# Patient Record
Sex: Female | Born: 1945 | Race: White | Hispanic: No | Marital: Married | Smoking: Former smoker
Health system: Southern US, Community
[De-identification: ages and names within clinical notes are randomized; demographics above are authoritative.]

## PROBLEM LIST (undated history)

## (undated) DIAGNOSIS — T7840XA Allergy, unspecified, initial encounter: Secondary | ICD-10-CM

## (undated) DIAGNOSIS — R42 Dizziness and giddiness: Secondary | ICD-10-CM

## (undated) DIAGNOSIS — I82402 Acute embolism and thrombosis of unspecified deep veins of left lower extremity: Secondary | ICD-10-CM

## (undated) DIAGNOSIS — Z972 Presence of dental prosthetic device (complete) (partial): Secondary | ICD-10-CM

## (undated) DIAGNOSIS — E785 Hyperlipidemia, unspecified: Secondary | ICD-10-CM

## (undated) DIAGNOSIS — N2889 Other specified disorders of kidney and ureter: Secondary | ICD-10-CM

## (undated) HISTORY — DX: Hyperlipidemia, unspecified: E78.5

## (undated) HISTORY — PX: SPINE SURGERY: SHX786

## (undated) HISTORY — DX: Allergy, unspecified, initial encounter: T78.40XA

## (undated) HISTORY — PX: CHOLECYSTECTOMY: SHX55

## (undated) HISTORY — PX: ADRENALECTOMY: SHX876

## (undated) HISTORY — DX: Other specified disorders of kidney and ureter: N28.89

## (undated) HISTORY — PX: LAPAROSCOPIC NEPHRECTOMY: SUR781

---

## 1994-07-08 HISTORY — PX: GANGLION CYST EXCISION: SHX1691

## 1997-11-21 ENCOUNTER — Ambulatory Visit (HOSPITAL_COMMUNITY): Admission: RE | Admit: 1997-11-21 | Discharge: 1997-11-21 | Payer: Self-pay | Admitting: Obstetrics & Gynecology

## 1999-07-09 DIAGNOSIS — C44621 Squamous cell carcinoma of skin of unspecified upper limb, including shoulder: Secondary | ICD-10-CM | POA: Insufficient documentation

## 2000-11-24 LAB — HM COLONOSCOPY

## 2001-12-02 ENCOUNTER — Other Ambulatory Visit: Admission: RE | Admit: 2001-12-02 | Discharge: 2001-12-02 | Payer: Self-pay | Admitting: Obstetrics & Gynecology

## 2002-12-08 ENCOUNTER — Other Ambulatory Visit: Admission: RE | Admit: 2002-12-08 | Discharge: 2002-12-08 | Payer: Self-pay | Admitting: Obstetrics & Gynecology

## 2004-01-25 ENCOUNTER — Other Ambulatory Visit: Admission: RE | Admit: 2004-01-25 | Discharge: 2004-01-25 | Payer: Self-pay | Admitting: Obstetrics & Gynecology

## 2004-03-19 ENCOUNTER — Encounter: Admission: RE | Admit: 2004-03-19 | Discharge: 2004-03-19 | Payer: Self-pay | Admitting: Obstetrics & Gynecology

## 2005-06-07 ENCOUNTER — Encounter: Admission: RE | Admit: 2005-06-07 | Discharge: 2005-06-07 | Payer: Self-pay | Admitting: Obstetrics and Gynecology

## 2006-06-11 ENCOUNTER — Encounter: Admission: RE | Admit: 2006-06-11 | Discharge: 2006-06-11 | Payer: Self-pay | Admitting: Obstetrics and Gynecology

## 2007-07-15 ENCOUNTER — Encounter: Admission: RE | Admit: 2007-07-15 | Discharge: 2007-07-15 | Payer: Self-pay | Admitting: Obstetrics and Gynecology

## 2008-07-15 ENCOUNTER — Encounter: Admission: RE | Admit: 2008-07-15 | Discharge: 2008-07-15 | Payer: Self-pay | Admitting: Obstetrics and Gynecology

## 2009-08-08 ENCOUNTER — Encounter: Admission: RE | Admit: 2009-08-08 | Discharge: 2009-08-08 | Payer: Self-pay | Admitting: Obstetrics and Gynecology

## 2010-06-26 ENCOUNTER — Ambulatory Visit: Payer: Self-pay | Admitting: Family Medicine

## 2010-07-29 ENCOUNTER — Encounter: Payer: Self-pay | Admitting: Obstetrics and Gynecology

## 2010-08-06 ENCOUNTER — Other Ambulatory Visit: Payer: Self-pay | Admitting: Family Medicine

## 2010-08-06 DIAGNOSIS — Z1239 Encounter for other screening for malignant neoplasm of breast: Secondary | ICD-10-CM

## 2010-08-27 ENCOUNTER — Ambulatory Visit
Admission: RE | Admit: 2010-08-27 | Discharge: 2010-08-27 | Disposition: A | Discharge status: Home / Self Care | Payer: BC Managed Care – PPO | Source: Ambulatory Visit | Attending: Family Medicine | Admitting: Family Medicine

## 2010-08-27 DIAGNOSIS — Z1239 Encounter for other screening for malignant neoplasm of breast: Secondary | ICD-10-CM

## 2011-08-09 ENCOUNTER — Other Ambulatory Visit: Payer: Self-pay | Admitting: Family Medicine

## 2011-08-09 DIAGNOSIS — Z1231 Encounter for screening mammogram for malignant neoplasm of breast: Secondary | ICD-10-CM

## 2011-08-09 HISTORY — PX: MELANOMA EXCISION: SHX5266

## 2011-08-29 ENCOUNTER — Ambulatory Visit: Payer: BC Managed Care – PPO

## 2011-09-03 ENCOUNTER — Ambulatory Visit
Admission: RE | Admit: 2011-09-03 | Discharge: 2011-09-03 | Disposition: A | Discharge status: Home / Self Care | Payer: BC Managed Care – PPO | Source: Ambulatory Visit | Attending: Family Medicine | Admitting: Family Medicine

## 2011-09-03 DIAGNOSIS — Z1231 Encounter for screening mammogram for malignant neoplasm of breast: Secondary | ICD-10-CM

## 2011-11-27 LAB — HM PAP SMEAR: HM Pap smear: NEGATIVE

## 2012-01-21 LAB — HM COLONOSCOPY

## 2012-08-07 ENCOUNTER — Other Ambulatory Visit (HOSPITAL_COMMUNITY): Payer: Self-pay | Admitting: Family Medicine

## 2012-08-07 DIAGNOSIS — Z1231 Encounter for screening mammogram for malignant neoplasm of breast: Secondary | ICD-10-CM

## 2012-09-04 ENCOUNTER — Ambulatory Visit (HOSPITAL_COMMUNITY)
Admission: RE | Admit: 2012-09-04 | Discharge: 2012-09-04 | Disposition: A | Discharge status: Home / Self Care | Payer: BC Managed Care – PPO | Source: Ambulatory Visit | Attending: Family Medicine | Admitting: Family Medicine

## 2012-09-04 DIAGNOSIS — Z1231 Encounter for screening mammogram for malignant neoplasm of breast: Secondary | ICD-10-CM | POA: Insufficient documentation

## 2013-07-27 ENCOUNTER — Other Ambulatory Visit (HOSPITAL_COMMUNITY): Payer: Self-pay | Admitting: Family Medicine

## 2013-07-27 DIAGNOSIS — Z1231 Encounter for screening mammogram for malignant neoplasm of breast: Secondary | ICD-10-CM

## 2013-09-07 ENCOUNTER — Ambulatory Visit (HOSPITAL_COMMUNITY)
Admission: RE | Admit: 2013-09-07 | Discharge: 2013-09-07 | Disposition: A | Discharge status: Home / Self Care | Payer: Medicare Other | Source: Ambulatory Visit | Attending: Family Medicine | Admitting: Family Medicine

## 2013-09-07 ENCOUNTER — Other Ambulatory Visit (HOSPITAL_COMMUNITY): Payer: Self-pay | Admitting: Family Medicine

## 2013-09-07 DIAGNOSIS — Z1231 Encounter for screening mammogram for malignant neoplasm of breast: Secondary | ICD-10-CM | POA: Insufficient documentation

## 2014-01-10 LAB — CBC AND DIFFERENTIAL
HCT: 38 % (ref 36–46)
Hemoglobin: 13.3 g/dL (ref 12.0–16.0)
Platelets: 339 10*3/uL (ref 150–399)
WBC: 5.9 10^3/mL

## 2014-01-10 LAB — BASIC METABOLIC PANEL
BUN: 11 mg/dL (ref 4–21)
Creatinine: 0.7 mg/dL (ref 0.5–1.1)
Glucose: 94 mg/dL
POTASSIUM: 4.5 mmol/L (ref 3.4–5.3)
Sodium: 143 mmol/L (ref 137–147)

## 2014-01-10 LAB — LIPID PANEL
Cholesterol: 219 mg/dL — AB (ref 0–200)
HDL: 57 mg/dL (ref 35–70)
LDL Cholesterol: 139 mg/dL
TRIGLYCERIDES: 115 mg/dL (ref 40–160)

## 2014-01-10 LAB — HEPATIC FUNCTION PANEL
ALT: 19 U/L (ref 7–35)
AST: 23 U/L (ref 13–35)

## 2014-01-10 LAB — TSH: TSH: 1.99 u[IU]/mL (ref 0.41–5.90)

## 2014-08-16 ENCOUNTER — Other Ambulatory Visit (HOSPITAL_COMMUNITY): Payer: Self-pay | Admitting: Family Medicine

## 2014-08-16 DIAGNOSIS — Z1231 Encounter for screening mammogram for malignant neoplasm of breast: Secondary | ICD-10-CM

## 2014-09-14 ENCOUNTER — Ambulatory Visit (HOSPITAL_COMMUNITY)
Admission: RE | Admit: 2014-09-14 | Discharge: 2014-09-14 | Disposition: A | Discharge status: Home / Self Care | Payer: Medicare Other | Source: Ambulatory Visit | Attending: Family Medicine | Admitting: Family Medicine

## 2014-09-14 DIAGNOSIS — Z1231 Encounter for screening mammogram for malignant neoplasm of breast: Secondary | ICD-10-CM | POA: Diagnosis not present

## 2014-09-14 LAB — HM MAMMOGRAPHY

## 2014-11-11 DIAGNOSIS — Z85820 Personal history of malignant melanoma of skin: Secondary | ICD-10-CM | POA: Insufficient documentation

## 2014-11-11 DIAGNOSIS — J309 Allergic rhinitis, unspecified: Secondary | ICD-10-CM | POA: Insufficient documentation

## 2014-11-11 DIAGNOSIS — E559 Vitamin D deficiency, unspecified: Secondary | ICD-10-CM | POA: Insufficient documentation

## 2014-11-11 DIAGNOSIS — E7800 Pure hypercholesterolemia, unspecified: Secondary | ICD-10-CM | POA: Insufficient documentation

## 2015-02-02 ENCOUNTER — Ambulatory Visit (INDEPENDENT_AMBULATORY_CARE_PROVIDER_SITE_OTHER): Payer: Medicare Other | Admitting: Family Medicine

## 2015-02-02 ENCOUNTER — Encounter: Payer: Self-pay | Admitting: Family Medicine

## 2015-02-02 VITALS — BP 120/60 | HR 70 | Temp 97.8°F | Resp 16 | Ht 65.0 in | Wt 144.0 lb

## 2015-02-02 DIAGNOSIS — Z13820 Encounter for screening for osteoporosis: Secondary | ICD-10-CM | POA: Diagnosis not present

## 2015-02-02 DIAGNOSIS — Z0000 Encounter for general adult medical examination without abnormal findings: Secondary | ICD-10-CM | POA: Diagnosis not present

## 2015-02-02 NOTE — Progress Notes (Signed)
Patient: Rita Webb, Female    DOB: Mar 13, 1946, 69 y.o.   MRN: [id number] Visit Date: 02/02/2015  Today's Provider: Margarita Rana, MD   Chief Complaint  Patient presents with  . Annual Exam    Medicare Wellness Exam   Subjective:    Annual wellness visit Rita Webb is a 69 y.o. female who presents today for her Subsequent Annual Wellness Visit. She feels well. She reports exercising 4-5 times a week just started this month. She reports she is sleeping well. Last PCP was 12/29/13.Last Mammogram was 09/14/14-BI-RAD-Category 1: Negative.Had an EKG on 12/28/12 Normal. Oc-Lyte was done 11/27/11 was Negative. Last Pap Smear was 06/20/10 normal.    Review of Systems  Constitutional: Negative.   HENT: Positive for postnasal drip, rhinorrhea and sinus pressure.        Always have this symptoms.  Eyes: Negative.   Respiratory: Positive for cough.   Cardiovascular: Negative.   Gastrointestinal: Negative.   Endocrine: Negative.   Genitourinary: Negative.   Musculoskeletal: Negative.   Skin: Negative.   Allergic/Immunologic: Negative.   Neurological: Negative.   Hematological: Bruises/bleeds easily (Noticed the bruise on the left hand this week. Doesn't remember if she hurt herself).  Psychiatric/Behavioral: Negative.  The patient is not nervous/anxious.     History   Social History  . Marital Status: Married    Spouse Name: N/A  . Number of Children: N/A  . Years of Education: N/A   Occupational History  . Not on file.   Social History Main Topics  . Smoking status: Former Smoker    Quit date: 11/10/1968  . Smokeless tobacco: Never Used  . Alcohol Use: No  . Drug Use: No  . Sexual Activity: Not on file   Other Topics Concern  . Not on file   Social History Narrative    Patient Active Problem List   Diagnosis Date Noted  . Allergic rhinitis 11/11/2014  . Hypercholesteremia 11/11/2014  . Abdominal malignancy 11/11/2014  . Avitaminosis D  11/11/2014    Past Surgical History  Procedure Laterality Date  . Spine surgery    . Melanoma excision  08/2011  . Ganglion cyst excision Right 1996    wrist    Her family history includes Breast cancer in her mother; Diabetes in her maternal aunt and maternal uncle; Prostate cancer in her father.    Previous Medications   AZELASTINE (ASTELIN) 0.1 % NASAL SPRAY       B COMPLEX-C PO    Take by mouth.   BECLOMETHASONE (QVAR) 40 MCG/ACT INHALER    Inhale into the lungs.   CALCIUM CARBONATE-VITAMIN D 600-200 MG-UNIT CAPS    Take by mouth.   CETIRIZINE HCL 10 MG CAPS    Take by mouth.   CHOLECALCIFEROL 1000 UNITS CAPSULE    Take by mouth.   IBUPROFEN 200 MG CAPS    Take by mouth.   MONTELUKAST (SINGULAIR) 10 MG TABLET    Take by mouth.   MULTIPLE VITAMIN PO    Take by mouth.   OMEGA-3 FATTY ACIDS (FISH OIL) 1200 MG CAPS    Take by mouth.   PSYLLIUM (REGULOID) 0.52 G CAPSULE    Take by mouth.    Patient Care Team: Margarita Rana, MD as PCP - General (Family Medicine)     Objective:   Vitals: BP 120/60 mmHg  Pulse 70  Temp(Src) 97.8 F (36.6 C) (Oral)  Resp 16  Ht '5\' 5"'$  (1.651 m)  Wt 144 lb (65.318 kg)  BMI 23.96 kg/m2  Physical Exam  Constitutional: She is oriented to person, place, and time. She appears well-developed and well-nourished.  HENT:  Head: Normocephalic and atraumatic.  Right Ear: Tympanic membrane, external ear and ear canal normal.  Left Ear: Tympanic membrane, external ear and ear canal normal.  Nose: Nose normal.  Mouth/Throat: Uvula is midline, oropharynx is clear and moist and mucous membranes are normal.  Eyes: Conjunctivae, EOM and lids are normal. Pupils are equal, round, and reactive to light.  Neck: Trachea normal and normal range of motion. Neck supple. Carotid bruit is not present. No thyroid mass and no thyromegaly present.  Cardiovascular: Normal rate, regular rhythm and normal heart sounds.   Pulmonary/Chest: Effort normal and breath sounds  normal.  Abdominal: Soft. Normal appearance and bowel sounds are normal. There is no hepatosplenomegaly. There is no tenderness.  Genitourinary: No breast swelling, tenderness or discharge.  Musculoskeletal: Normal range of motion.  Lymphadenopathy:    She has no cervical adenopathy.    She has no axillary adenopathy.  Neurological: She is alert and oriented to person, place, and time. She has normal strength. No cranial nerve deficit.  Skin: Skin is warm, dry and intact.  Psychiatric: She has a normal mood and affect. Her speech is normal and behavior is normal. Judgment and thought content normal. Cognition and memory are normal.       Activities of Daily Living In your present state of health, do you have any difficulty performing the following activities: 02/02/2015  Hearing? N  Vision? N  Difficulty concentrating or making decisions? N  Walking or climbing stairs? N  Dressing or bathing? N  Doing errands, shopping? N    Fall Risk Assessment Fall Risk  02/02/2015  Falls in the past year? No     Depression Screen PHQ 2/9 Scores 02/02/2015  PHQ - 2 Score 0    Cognitive Testing - 6-CIT  Correct? Score   What year is it? yes 0 0 or 4  What month is it? yes 0 0 or 3  Memorize:    [name],  44,  [address],  [place],      What time is it? (within 1 hour) yes 0 0 or 3  Count backwards from 20 yes 0 0, 2, or 4  Name the months of the year yes 0 0, 2, or 4  Repeat name & address above yes 0 0, 2, 4, 6, 8, or 10       TOTAL SCORE  0/28   Interpretation:  Normal  Normal (0-7) Abnormal (8-28)       Assessment & Plan:     Annual Wellness Visit  Reviewed patient's Family Medical History Reviewed and updated list of patient's medical providers Assessment of cognitive impairment was done Assessed patient's functional ability Established a written schedule for health screening [place] Completed and Reviewed  Exercise Activities and Dietary  recommendations Goals    None      Immunization History  Administered Date(s) Administered  . Pneumococcal Conjugate-13 12/29/2013  . Pneumococcal Polysaccharide-23 12/28/2012  . Tdap 11/27/2011  . Zoster 01/23/2011    Health Maintenance  Topic Date Due  . COLONOSCOPY  11/25/2010  . INFLUENZA VACCINE  02/06/2015  . MAMMOGRAM  09/13/2016  . TETANUS/TDAP  11/26/2021  . DEXA SCAN  Completed  . ZOSTAVAX  Completed  . PNA vac Low Risk Adult  Completed      Discussed  health benefits of physical activity, and encouraged her to engage in regular exercise appropriate for her age and condition.    2. Screening for osteoporosis Scan ordered.     - DG Bone Density; Future  Patient was seen and examined by Jerrell Belfast, MD, and note scribed by Lyndel Pleasure, [place].  I have reviewed the document for accuracy and completeness and I agree with above. Jerrell Belfast, MD   Margarita Rana, MD

## 2015-02-08 ENCOUNTER — Ambulatory Visit
Admission: RE | Admit: 2015-02-08 | Discharge: 2015-02-08 | Disposition: A | Discharge status: Home / Self Care | Payer: Medicare Other | Source: Ambulatory Visit | Attending: Family Medicine | Admitting: Family Medicine

## 2015-02-08 DIAGNOSIS — Z13820 Encounter for screening for osteoporosis: Secondary | ICD-10-CM | POA: Diagnosis present

## 2015-02-08 DIAGNOSIS — M810 Age-related osteoporosis without current pathological fracture: Secondary | ICD-10-CM | POA: Insufficient documentation

## 2015-02-09 ENCOUNTER — Telehealth: Payer: Self-pay

## 2015-02-09 ENCOUNTER — Encounter: Payer: Self-pay | Admitting: Family Medicine

## 2015-02-09 NOTE — Telephone Encounter (Signed)
Pt advised apt made for 02/27/2015   Thanks,   -Mickel Baas

## 2015-02-09 NOTE — Telephone Encounter (Signed)
-----   Message from Margarita Rana, MD sent at 02/09/2015  7:08 AM EDT ----- Bone density does show osteoporosis. Recommend ov to address treatment options. Thanks.

## 2015-02-27 ENCOUNTER — Encounter: Payer: Self-pay | Admitting: Family Medicine

## 2015-02-27 ENCOUNTER — Ambulatory Visit (INDEPENDENT_AMBULATORY_CARE_PROVIDER_SITE_OTHER): Payer: Medicare Other | Admitting: Family Medicine

## 2015-02-27 VITALS — BP 112/60 | HR 76 | Temp 98.2°F | Resp 16 | Ht 65.0 in | Wt 143.0 lb

## 2015-02-27 DIAGNOSIS — M810 Age-related osteoporosis without current pathological fracture: Secondary | ICD-10-CM | POA: Diagnosis not present

## 2015-02-27 NOTE — Progress Notes (Signed)
Patient ID: Rita Webb, female   DOB: [birth year], 69 y.o.   MRN: [id number]        Patient: Rita Webb Female    DOB: [birth year]   69 y.o.   MRN: [id number] Visit Date: 02/27/2015  Today's Provider: Margarita Rana, MD   Chief Complaint  Patient presents with  . Osteoporosis   Subjective:    HPI  Osteoporosis: Patient here to discuss treatment options for osteoporosis. She was diagnosed with osteoporosis by bone density scan in 02/08/2015. Patient denies history of fracture.The cause of osteoporosis is felt to be due to age.   She is currently being treated with calcium and vitamin D supplementation.  She is not currently being treated with bisphosphonates  Osteoporosis Risk Factors  Nonmodifiable Personal Hx of fracture as an adult: no Hx of fracture in first-degree relative: no Caucasian race: yes Advanced age: 81 Female sex: yes Dementia: no  Current calcium and Vit D intake: Dietary sources: healthy  Supplements: Calcium-Vit D, and multi vitamin daily.  Ten-year fracture probability by FRAX is less than 1 percent for hip, but does have osteoporosis in spine. No history of vertebral fractures.      Allergies  Allergen Reactions  . Cefuroxime Diarrhea   Previous Medications   AZELASTINE (ASTELIN) 0.1 % NASAL SPRAY    Place 1 spray into both nostrils.    B COMPLEX-C PO    Take 1 tablet by mouth daily.    BECLOMETHASONE (QVAR) 40 MCG/ACT INHALER    Inhale 2 puffs into the lungs 2 (two) times daily.    CALCIUM CARBONATE-VITAMIN D 600-200 MG-UNIT CAPS    Take 2 capsules by mouth daily.    CETIRIZINE HCL 10 MG CAPS    Take 1 capsule by mouth daily.    CHOLECALCIFEROL 1000 UNITS CAPSULE    Take 1,000 Units by mouth daily.    IBUPROFEN 200 MG CAPS    Take 1 capsule by mouth as needed.    MONTELUKAST (SINGULAIR) 10 MG TABLET    Take 10 mg by mouth at bedtime.    MULTIPLE VITAMIN PO    Take 1 tablet by mouth daily.    OMEGA-3 FATTY ACIDS (FISH OIL) 1200 MG CAPS     Take 1 capsule by mouth daily.    PSYLLIUM (REGULOID) 0.52 G CAPSULE    Take 1 capsule by mouth 6 (six) times daily.     Review of Systems  Constitutional: Negative.   Respiratory: Negative.   Cardiovascular: Negative.   Musculoskeletal: Positive for arthralgias (leg pain). Negative for back pain and joint swelling.  Neurological: Negative for numbness.  Psychiatric/Behavioral: Negative.     Social History  Substance Use Topics  . Smoking status: Former Smoker    Quit date: 11/10/1968  . Smokeless tobacco: Never Used  . Alcohol Use: No   Objective:   BP 112/60 mmHg  Pulse 76  Temp(Src) 98.2 F (36.8 C) (Oral)  Resp 16  Ht '5\' 5"'$  (1.651 m)  Wt 143 lb (64.864 kg)  BMI 23.80 kg/m2  SpO2 97%  Physical Exam  Constitutional: She is oriented to person, place, and time. She appears well-developed and well-nourished.  Neurological: She is oriented to person, place, and time.  Psychiatric: She has a normal mood and affect.        Assessment & Plan:     1. 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 Will hold off on medication for now.  Treat with lifestyle. Will call if any problems or  changes. Particularly back pain. Repeat bone density in 2 years.        Margarita Rana, MD  [facility]

## 2015-03-08 ENCOUNTER — Telehealth: Payer: Self-pay

## 2015-03-08 NOTE — Telephone Encounter (Signed)
Patients husband Patrick Jupiter called stating patient was in the shower shaving her legs and she cut the side of her left leg. At first blood was spurting out. Patrick Jupiter covered the cut with gauze and secured it with tape. Bleeding has slowed up since applying the dressing. Patient is alert and oriented. Patrick Jupiter wanted to know if patient should come into the office or go to the ER. Patient is not on any blood thinners or Aspirin. Patient does have varicose veins and may have nicked one while shaving. I consulted with Dr. Venia Minks who states patient should continue to keep the cut covered with a pressure dressing to stop bleeding and if patient wants to she can come in tomorrow for re check. Patient advised and verbally voiced understanding. Patient wants to come in tomorrow to be seen. Appointment scheduled.

## 2015-03-09 ENCOUNTER — Encounter: Payer: Self-pay | Admitting: Family Medicine

## 2015-03-09 ENCOUNTER — Ambulatory Visit (INDEPENDENT_AMBULATORY_CARE_PROVIDER_SITE_OTHER): Payer: Medicare Other | Admitting: Family Medicine

## 2015-03-09 VITALS — BP 122/62 | HR 68 | Temp 98.5°F | Resp 16 | Ht 65.0 in | Wt 144.0 lb

## 2015-03-09 DIAGNOSIS — I8393 Asymptomatic varicose veins of bilateral lower extremities: Secondary | ICD-10-CM | POA: Diagnosis not present

## 2015-03-09 DIAGNOSIS — I8390 Asymptomatic varicose veins of unspecified lower extremity: Secondary | ICD-10-CM

## 2015-03-09 DIAGNOSIS — R58 Hemorrhage, not elsewhere classified: Secondary | ICD-10-CM

## 2015-03-09 NOTE — Progress Notes (Signed)
Subjective:    Patient ID: Rita Webb, female    DOB: 27-Feb-1946, 69 y.o.   MRN: [id number]  HPI Comments: Pt cut her leg while shaving. States, "blood was spewing everywhere!" Pt wrapped the laceration about 10 minutes after she cut herself. Pt states she felt lightheaded yesterday, but is unsure if it was secondary to blood loss vs anxiety of cutting self.  Laceration  The incident occurred 12 to 24 hours ago. The laceration is located on the left leg. The laceration mechanism was a razor. The pain is at a severity of 0/10. The patient is experiencing no pain. She reports no foreign bodies present. Her tetanus status is UTD (11/27/2011).      Review of Systems  Constitutional: Negative for fever, chills, diaphoresis, activity change, appetite change, fatigue and unexpected weight change.  Respiratory: Positive for cough. Negative for chest tightness, shortness of breath and wheezing.   Cardiovascular: Negative for chest pain, palpitations and leg swelling.  Skin: Positive for wound. Negative for color change, pallor and rash.  Allergic/Immunologic: Positive for environmental allergies.   BP 122/62 mmHg  Pulse 68  Temp(Src) 98.5 F (36.9 C) (Oral)  Resp 16  Ht '5\' 5"'$  (1.651 m)  Wt 144 lb (65.318 kg)  BMI 23.96 kg/m2   Patient Active Problem List   Diagnosis Date Noted  . Osteoporosis 02/27/2015  . Allergic rhinitis 11/11/2014  . Hypercholesteremia 11/11/2014  . Abdominal malignancy 11/11/2014  . Avitaminosis D 11/11/2014   Past Medical History  Diagnosis Date  . Allergy   . Hyperlipidemia    Current Outpatient Prescriptions on File Prior to Visit  Medication Sig  . azelastine (ASTELIN) 0.1 % nasal spray Place 1 spray into both nostrils.   . B COMPLEX-C PO Take 1 tablet by mouth daily.   . beclomethasone (QVAR) 40 MCG/ACT inhaler Inhale 2 puffs into the lungs 2 (two) times daily.   . Calcium Carbonate-Vitamin D 600-200 MG-UNIT CAPS Take 2 capsules by mouth daily.    . Cetirizine HCl 10 MG CAPS Take 1 capsule by mouth daily.   . Cholecalciferol 1000 UNITS capsule Take 1,000 Units by mouth daily.   . Ibuprofen 200 MG CAPS Take 1 capsule by mouth as needed.   . montelukast (SINGULAIR) 10 MG tablet Take 10 mg by mouth at bedtime.   . MULTIPLE VITAMIN PO Take 1 tablet by mouth daily.   . Omega-3 Fatty Acids (FISH OIL) 1200 MG CAPS Take 1 capsule by mouth daily.   . psyllium (REGULOID) 0.52 G capsule Take 1 capsule by mouth 6 (six) times daily.    No current facility-administered medications on file prior to visit.   Allergies  Allergen Reactions  . Cefuroxime Diarrhea   Past Surgical History  Procedure Laterality Date  . Spine surgery    . Melanoma excision  08/2011  . Ganglion cyst excision Right 1996    wrist   Social History   Social History  . Marital Status: Married    Spouse Name: N/A  . Number of Children: N/A  . Years of Education: N/A   Occupational History  . Not on file.   Social History Main Topics  . Smoking status: Former Smoker    Quit date: 11/10/1968  . Smokeless tobacco: Never Used  . Alcohol Use: No  . Drug Use: No  . Sexual Activity: Not on file   Other Topics Concern  . Not on file   Social History Narrative   Family History  Problem Relation Age of Onset  . Breast cancer Mother   . Prostate cancer Father   . Diabetes Maternal Aunt   . Diabetes Maternal Uncle       Objective:   Physical Exam  Constitutional: She appears well-developed and well-nourished.  Musculoskeletal: She exhibits no edema or tenderness.  Skin: Skin is warm and dry.  Does have a varicose vein lesion noted.  Small scab noted.  No active bleeding.     BP 122/62 mmHg  Pulse 68  Temp(Src) 98.5 F (36.9 C) (Oral)  Resp 16  Ht '5\' 5"'$  (1.651 m)  Wt 144 lb (65.318 kg)  BMI 23.96 kg/m2      Assessment & Plan:  1. Varicose vein of leg Stable.   2. Bleeding Related to shaving.  Do not shave in shower.  Monitor and have  dermatology assess if does not heal.   Margarita Rana, MD

## 2015-05-11 ENCOUNTER — Ambulatory Visit (INDEPENDENT_AMBULATORY_CARE_PROVIDER_SITE_OTHER): Payer: Medicare Other

## 2015-05-11 DIAGNOSIS — Z23 Encounter for immunization: Secondary | ICD-10-CM | POA: Diagnosis not present

## 2015-08-11 ENCOUNTER — Other Ambulatory Visit: Payer: Self-pay

## 2015-08-11 DIAGNOSIS — Z1231 Encounter for screening mammogram for malignant neoplasm of breast: Secondary | ICD-10-CM

## 2015-09-19 ENCOUNTER — Ambulatory Visit
Admission: RE | Admit: 2015-09-19 | Discharge: 2015-09-19 | Disposition: A | Discharge status: Home / Self Care | Payer: Medicare Other | Source: Ambulatory Visit

## 2015-09-19 DIAGNOSIS — Z1231 Encounter for screening mammogram for malignant neoplasm of breast: Secondary | ICD-10-CM

## 2015-09-21 DIAGNOSIS — J4530 Mild persistent asthma, uncomplicated: Secondary | ICD-10-CM | POA: Diagnosis not present

## 2015-09-21 DIAGNOSIS — H1045 Other chronic allergic conjunctivitis: Secondary | ICD-10-CM | POA: Diagnosis not present

## 2015-09-21 DIAGNOSIS — J300 Vasomotor rhinitis: Secondary | ICD-10-CM | POA: Diagnosis not present

## 2015-10-31 DIAGNOSIS — L570 Actinic keratosis: Secondary | ICD-10-CM | POA: Diagnosis not present

## 2015-10-31 DIAGNOSIS — Z85828 Personal history of other malignant neoplasm of skin: Secondary | ICD-10-CM | POA: Diagnosis not present

## 2015-10-31 DIAGNOSIS — Z85820 Personal history of malignant melanoma of skin: Secondary | ICD-10-CM | POA: Diagnosis not present

## 2015-10-31 DIAGNOSIS — D225 Melanocytic nevi of trunk: Secondary | ICD-10-CM | POA: Diagnosis not present

## 2015-10-31 DIAGNOSIS — X32XXXA Exposure to sunlight, initial encounter: Secondary | ICD-10-CM | POA: Diagnosis not present

## 2015-10-31 DIAGNOSIS — L821 Other seborrheic keratosis: Secondary | ICD-10-CM | POA: Diagnosis not present

## 2016-01-10 DIAGNOSIS — H00011 Hordeolum externum right upper eyelid: Secondary | ICD-10-CM | POA: Diagnosis not present

## 2016-02-07 ENCOUNTER — Ambulatory Visit (INDEPENDENT_AMBULATORY_CARE_PROVIDER_SITE_OTHER): Payer: Medicare Other | Admitting: Physician Assistant

## 2016-02-07 ENCOUNTER — Encounter: Payer: Self-pay | Admitting: Physician Assistant

## 2016-02-07 VITALS — BP 140/70 | HR 72 | Temp 98.3°F | Resp 16 | Ht 65.5 in | Wt 151.0 lb

## 2016-02-07 DIAGNOSIS — E7800 Pure hypercholesterolemia, unspecified: Secondary | ICD-10-CM

## 2016-02-07 DIAGNOSIS — IMO0001 Reserved for inherently not codable concepts without codable children: Secondary | ICD-10-CM

## 2016-02-07 DIAGNOSIS — J320 Chronic maxillary sinusitis: Secondary | ICD-10-CM | POA: Diagnosis not present

## 2016-02-07 DIAGNOSIS — Z131 Encounter for screening for diabetes mellitus: Secondary | ICD-10-CM | POA: Diagnosis not present

## 2016-02-07 DIAGNOSIS — J302 Other seasonal allergic rhinitis: Secondary | ICD-10-CM

## 2016-02-07 DIAGNOSIS — Z0000 Encounter for general adult medical examination without abnormal findings: Secondary | ICD-10-CM | POA: Diagnosis not present

## 2016-02-07 DIAGNOSIS — R030 Elevated blood-pressure reading, without diagnosis of hypertension: Secondary | ICD-10-CM | POA: Diagnosis not present

## 2016-02-07 NOTE — Patient Instructions (Signed)

## 2016-02-07 NOTE — Progress Notes (Signed)
Patient: Rita Webb, Female    DOB: 26-Oct-1945, 70 y.o.   MRN: [id number] Visit Date: 02/07/2016  Today's Provider: Mar Daring, PA-C   Chief Complaint  Patient presents with  . Medicare Wellness   Subjective:    Annual wellness visit Rita Webb is a 70 y.o. female. She feels well. She reports exercising- walks a mile daily. She reports she is sleeping well.  Last PCP:02/02/15 EKG: 12/31/2012 BMD: 02/08/15 Osteoporosis-Repeat in 2 years. Mammogram: 09/19/15 BI-RADS Category 1:Negative Colonoscopy:01/21/2012 -----------------------------------------------------------   Review of Systems  Constitutional: Negative.   HENT: Positive for congestion, postnasal drip, rhinorrhea, sinus pressure, sneezing and tinnitus.   Eyes: Positive for itching.  Respiratory: Positive for cough.   Cardiovascular: Negative.   Gastrointestinal: Negative.   Endocrine: Negative.   Genitourinary: Negative.   Musculoskeletal: Negative.   Skin: Negative.   Allergic/Immunologic: Positive for environmental allergies.  Neurological: Negative.   Hematological: Bruises/bleeds easily.  Psychiatric/Behavioral: Negative.     Social History   Social History  . Marital status: Married    Spouse name: N/A  . Number of children: N/A  . Years of education: N/A   Occupational History  . Not on file.   Social History Main Topics  . Smoking status: Former Smoker    Quit date: 11/10/1968  . Smokeless tobacco: Never Used  . Alcohol use No  . Drug use: No  . Sexual activity: Not on file   Other Topics Concern  . Not on file   Social History Narrative  . No narrative on file    Past Medical History:  Diagnosis Date  . Allergy   . Hyperlipidemia      Patient Active Problem List   Diagnosis Date Noted  . Osteoporosis 02/27/2015  . Allergic rhinitis 11/11/2014  . Hypercholesteremia 11/11/2014  . Abdominal malignancy ([place]) 11/11/2014  . Avitaminosis D 11/11/2014     Past Surgical History:  Procedure Laterality Date  . GANGLION CYST EXCISION Right 1996   wrist  . MELANOMA EXCISION  08/2011  . SPINE SURGERY      Her family history includes Breast cancer in her mother; Diabetes in her maternal aunt and maternal uncle; Prostate cancer in her father.    Current Meds  Medication Sig  . azelastine (ASTELIN) 0.1 % nasal spray Place 1 spray into both nostrils.   . B COMPLEX-C PO Take 1 tablet by mouth daily.   . Calcium Carbonate-Vitamin D 600-200 MG-UNIT CAPS Take 2 capsules by mouth daily.   . Cetirizine HCl 10 MG CAPS Take 1 capsule by mouth daily.   . Cholecalciferol 1000 UNITS capsule Take 1,000 Units by mouth daily.   . Ibuprofen 200 MG CAPS Take 1 capsule by mouth as needed.   . montelukast (SINGULAIR) 10 MG tablet Take 10 mg by mouth at bedtime.   . MULTIPLE VITAMIN PO Take 1 tablet by mouth daily.   . Omega-3 Fatty Acids (FISH OIL) 1200 MG CAPS Take 1 capsule by mouth daily.   . psyllium (REGULOID) 0.52 G capsule Take 1 capsule by mouth 6 (six) times daily.   . vitamin E 400 UNIT capsule Take 400 Units by mouth daily.    Patient Care Team: Margarita Rana, MD as PCP - General (Family Medicine)    Objective:   Vitals: BP 140/70 (BP Location: Right Arm, Patient Position: Sitting, Cuff Size: Normal)   Pulse 72   Temp 98.3 F (36.8 C) (Oral)   Resp 16  Ht 5' 5.5" (1.664 m)   Wt 151 lb (68.5 kg)   BMI 24.75 kg/m   Physical Exam  Constitutional: She is oriented to person, place, and time. She appears well-developed and well-nourished. No distress.  HENT:  Head: Normocephalic and atraumatic.  Right Ear: Tympanic membrane, external ear and ear canal normal.  Left Ear: Tympanic membrane, external ear and ear canal normal.  Nose: Right sinus exhibits no maxillary sinus tenderness and no frontal sinus tenderness. Left sinus exhibits maxillary sinus tenderness. Left sinus exhibits no frontal sinus tenderness.  Mouth/Throat: Uvula is  midline, oropharynx is clear and moist and mucous membranes are normal. No oropharyngeal exudate, posterior oropharyngeal edema or posterior oropharyngeal erythema.  Thick, clear post nasal drainage noted  Eyes: Conjunctivae and EOM are normal. Pupils are equal, round, and reactive to light. Right eye exhibits no discharge. Left eye exhibits no discharge. No scleral icterus.  Neck: Normal range of motion. Neck supple. No JVD present. Carotid bruit is not present. No tracheal deviation present. No thyromegaly present.  Cardiovascular: Normal rate, regular rhythm, normal heart sounds and intact distal pulses.  Exam reveals no gallop and no friction rub.   No murmur heard. Pulmonary/Chest: Effort normal and breath sounds normal. No respiratory distress. She has no wheezes. She has no rales. She exhibits no tenderness.  Abdominal: Soft. Bowel sounds are normal. She exhibits no distension and no mass. There is no tenderness. There is no rebound and no guarding.  Musculoskeletal: Normal range of motion. She exhibits no edema or tenderness.  Lymphadenopathy:    She has no cervical adenopathy.  Neurological: She is alert and oriented to person, place, and time.  Skin: Skin is warm and dry. No rash noted. She is not diaphoretic.  Psychiatric: She has a normal mood and affect. Her behavior is normal. Judgment and thought content normal.  Vitals reviewed.   Activities of Daily Living In your present state of health, do you have any difficulty performing the following activities: 02/07/2016  Hearing? N  Vision? N  Difficulty concentrating or making decisions? N  Walking or climbing stairs? N  Dressing or bathing? N  Doing errands, shopping? N  Some recent data might be hidden    Fall Risk Assessment Fall Risk  02/07/2016 02/02/2015  Falls in the past year? No No     Depression Screen PHQ 2/9 Scores 02/07/2016 02/02/2015  PHQ - 2 Score 0 0    Cognitive Testing - 6-CIT  Correct? Score   What year  is it? yes 0 0 or 4  What month is it? yes 0 0 or 3  Memorize:    Rita Webb,  44,  [address]  [place],      What time is it? (within 1 hour) yes 0 0 or 3  Count backwards from 20 yes 0 0, 2, or 4  Name the months of the year yes 0 0, 2, or 4  Repeat name & address above yes 0 0, 2, 4, 6, 8, or 10       TOTAL SCORE  0/28   Interpretation:  Normal  Normal (0-7) Abnormal (8-28)   Audit-C Alcohol Use Screening  Question Answer Points  How often do you have alcoholic drink? never 0  On days you do drink alcohol, how many drinks do you typically consume? 0 0  How oftey will you drink 6 or more in a total? never 0  Total Score:  0   A score of 3  or more in women, and 4 or more in men indicates increased risk for alcohol abuse, EXCEPT if all of the points are from question 1.  Visual Acuity Screening   Right eye Left eye Both eyes  Without correction:     With correction: '20/30 20/30 20/20 '$  Comments: Patient has an appointment tomorrow.     Assessment & Plan:     Annual Wellness Visit  Reviewed patient's Family Medical History Reviewed and updated list of patient's medical providers Assessment of cognitive impairment was done Assessed patient's functional ability Established a written schedule for health screening [place] Completed and Reviewed  Exercise Activities and Dietary recommendations Goals    None      Immunization History  Administered Date(s) Administered  . Influenza, High Dose Seasonal PF 05/11/2015  . Pneumococcal Conjugate-13 12/29/2013  . Pneumococcal Polysaccharide-23 12/28/2012  . Tdap 11/27/2011  . Zoster 01/23/2011    Health Maintenance  Topic Date Due  . Hepatitis C Screening  [birth year]  . INFLUENZA VACCINE  02/06/2016  . MAMMOGRAM  09/18/2017  . TETANUS/TDAP  11/26/2021  . COLONOSCOPY  01/20/2022  . DEXA SCAN  Completed  . ZOSTAVAX  Completed  . PNA vac Low Risk Adult  Completed      Discussed health  benefits of physical activity, and encouraged her to engage in regular exercise appropriate for her age and condition.  1. Medicare annual wellness visit, subsequent Normal physical exam today.   2. Hypercholesteremia Will check labs as below and f/u pending results. - Lipid Profile  3. Elevated BP Will check labs as below and f/u pending results. - CBC with Differential - Comprehensive Metabolic Panel (CMET)  4. Encounter for screening for diabetes mellitus Will check labs as below and f/u pending results. - HgB A1c  5. Other seasonal allergic rhinitis She has chronic left maxillary sinus pressure, rhinorrhea, postnasal drainage causing cough/throat clearing. She has seen an allergist in the past and even did allergy shots for 7 years with relief. Would like to be seen by ENT to make sure nothing else has been missed. Continue zyrtec, singulair and azelastine nasal spray. Will refer to ENT as below for further evaluation. - Ambulatory referral to ENT  6. Chronic maxillary sinusitis See above medical treatment plan. - Ambulatory referral to ENT   ------------------------------------------------------------------------------------------------------------    Mar Daring, PA-C  [facility]

## 2016-02-08 ENCOUNTER — Telehealth: Payer: Self-pay

## 2016-02-08 LAB — COMPREHENSIVE METABOLIC PANEL
A/G RATIO: 1.8 (ref 1.2–2.2)
ALT: 28 IU/L (ref 0–32)
AST: 29 IU/L (ref 0–40)
Albumin: 4.4 g/dL (ref 3.6–4.8)
Alkaline Phosphatase: 90 IU/L (ref 39–117)
BUN/Creatinine Ratio: 20 (ref 12–28)
BUN: 14 mg/dL (ref 8–27)
Bilirubin Total: 0.3 mg/dL (ref 0.0–1.2)
CALCIUM: 8.9 mg/dL (ref 8.7–10.3)
CO2: 24 mmol/L (ref 18–29)
Chloride: 102 mmol/L (ref 96–106)
Creatinine, Ser: 0.69 mg/dL (ref 0.57–1.00)
GFR, EST AFRICAN AMERICAN: 103 mL/min/{1.73_m2} (ref >59)
GFR, EST NON AFRICAN AMERICAN: 90 mL/min/{1.73_m2} (ref >59)
Globulin, Total: 2.4 g/dL (ref 1.5–4.5)
Glucose: 99 mg/dL (ref 65–99)
POTASSIUM: 4.4 mmol/L (ref 3.5–5.2)
Sodium: 143 mmol/L (ref 134–144)
TOTAL PROTEIN: 6.8 g/dL (ref 6.0–8.5)

## 2016-02-08 LAB — CBC WITH DIFFERENTIAL/PLATELET
BASOS ABS: 0 10*3/uL (ref 0.0–0.2)
Basos: 0 %
EOS (ABSOLUTE): 0.1 10*3/uL (ref 0.0–0.4)
Eos: 1 %
Hematocrit: 38.6 % (ref 34.0–46.6)
Hemoglobin: 12.7 g/dL (ref 11.1–15.9)
IMMATURE GRANS (ABS): 0 10*3/uL (ref 0.0–0.1)
Immature Granulocytes: 0 %
LYMPHS: 46 %
Lymphocytes Absolute: 3 10*3/uL (ref 0.7–3.1)
MCH: 30.3 pg (ref 26.6–33.0)
MCHC: 32.9 g/dL (ref 31.5–35.7)
MCV: 92 fL (ref 79–97)
Monocytes Absolute: 0.5 10*3/uL (ref 0.1–0.9)
Monocytes: 7 %
Neutrophils Absolute: 3 10*3/uL (ref 1.4–7.0)
Neutrophils: 46 %
Platelets: 265 10*3/uL (ref 150–379)
RBC: 4.19 x10E6/uL (ref 3.77–5.28)
RDW: 13.6 % (ref 12.3–15.4)
WBC: 6.6 10*3/uL (ref 3.4–10.8)

## 2016-02-08 LAB — LIPID PANEL
CHOL/HDL RATIO: 4.6 ratio — AB (ref 0.0–4.4)
Cholesterol, Total: 260 mg/dL — ABNORMAL HIGH (ref 100–199)
HDL: 56 mg/dL (ref >39)
LDL Calculated: 180 mg/dL — ABNORMAL HIGH (ref 0–99)
Triglycerides: 121 mg/dL (ref 0–149)
VLDL CHOLESTEROL CAL: 24 mg/dL (ref 5–40)

## 2016-02-08 LAB — HEMOGLOBIN A1C
Est. average glucose Bld gHb Est-mCnc: 114 mg/dL
HEMOGLOBIN A1C: 5.6 % (ref 4.8–5.6)

## 2016-02-08 NOTE — Telephone Encounter (Signed)
-----   Message from Mar Daring, [place] sent at 02/08/2016 10:00 AM EDT ----- All labs are within normal limits and stable with exception of cholesterol. Your 10-yr risk for atherosclerotic disease is elevated at 9.9% risk. Anything greater than 7.5 is increased risk. I do recommend starting a cholesterol lowering medication at this time. If agreeable I will send in medication to pharmacy on file. We will recheck cholesterol in 6 months.  Thanks! -JB

## 2016-02-08 NOTE — Telephone Encounter (Signed)
Called patient regarding her labs result-No answer.  Thanks,  -Joseline

## 2016-02-09 ENCOUNTER — Telehealth: Payer: Self-pay | Admitting: Family Medicine

## 2016-02-09 NOTE — Telephone Encounter (Signed)
Pt is returning call.  YH#[phone]/OE

## 2016-02-09 NOTE — Telephone Encounter (Signed)
-----   Message from Mar Daring, [place] sent at 02/08/2016 10:00 AM EDT ----- All labs are within normal limits and stable with exception of cholesterol. Your 10-yr risk for atherosclerotic disease is elevated at 9.9% risk. Anything greater than 7.5 is increased risk. I do recommend starting a cholesterol lowering medication at this time. If agreeable I will send in medication to pharmacy on file. We will recheck cholesterol in 6 months.  Thanks! -JB

## 2016-02-09 NOTE — Telephone Encounter (Signed)
Patient advised of cholesterol and scheduled 6 month follow-up appointment.  Thanks,  -Joseline

## 2016-02-09 NOTE — Telephone Encounter (Signed)
Patient advised as directed below. She voiced understanding. She will call back to get the Cholesterol numbers and to scheduled her 6 months follow-up. She was entering the store when I called.  Thanks,  -Arkeem Harts

## 2016-02-14 ENCOUNTER — Other Ambulatory Visit: Payer: Self-pay | Admitting: Physician Assistant

## 2016-02-14 DIAGNOSIS — E7800 Pure hypercholesterolemia, unspecified: Secondary | ICD-10-CM

## 2016-02-14 MED ORDER — SIMVASTATIN 10 MG PO TABS: 10.0000 mg | ORAL_TABLET | Freq: Every day | ORAL | 0 refills | Status: DC

## 2016-02-14 NOTE — Telephone Encounter (Signed)
Pt stated that when she was in the office 02/07/16 she was advised to start taking a cholesterol medication and the RX would be sent to [facility]. Pt stated that she was advised they haven't received the RX. Please advise. Thanks TNP

## 2016-02-14 NOTE — Telephone Encounter (Signed)
Gwenette Greet, can you send this to [place] please.

## 2016-02-14 NOTE — Telephone Encounter (Signed)
Rx sent 

## 2016-02-26 ENCOUNTER — Other Ambulatory Visit: Payer: Self-pay | Admitting: Unknown Physician Specialty

## 2016-02-26 ENCOUNTER — Ambulatory Visit
Admission: RE | Admit: 2016-02-26 | Discharge: 2016-02-26 | Disposition: A | Discharge status: Home / Self Care | Payer: Medicare Other | Source: Ambulatory Visit | Attending: Unknown Physician Specialty | Admitting: Unknown Physician Specialty

## 2016-02-26 DIAGNOSIS — R05 Cough: Secondary | ICD-10-CM

## 2016-02-26 DIAGNOSIS — R059 Cough, unspecified: Secondary | ICD-10-CM

## 2016-02-26 DIAGNOSIS — J329 Chronic sinusitis, unspecified: Secondary | ICD-10-CM | POA: Insufficient documentation

## 2016-02-26 DIAGNOSIS — R51 Headache: Secondary | ICD-10-CM | POA: Diagnosis not present

## 2016-03-06 DIAGNOSIS — J301 Allergic rhinitis due to pollen: Secondary | ICD-10-CM | POA: Diagnosis not present

## 2016-03-18 DIAGNOSIS — L821 Other seborrheic keratosis: Secondary | ICD-10-CM | POA: Diagnosis not present

## 2016-04-19 DIAGNOSIS — J301 Allergic rhinitis due to pollen: Secondary | ICD-10-CM | POA: Diagnosis not present

## 2016-04-22 DIAGNOSIS — J301 Allergic rhinitis due to pollen: Secondary | ICD-10-CM | POA: Diagnosis not present

## 2016-04-25 DIAGNOSIS — J301 Allergic rhinitis due to pollen: Secondary | ICD-10-CM | POA: Diagnosis not present

## 2016-04-29 DIAGNOSIS — J301 Allergic rhinitis due to pollen: Secondary | ICD-10-CM | POA: Diagnosis not present

## 2016-04-30 DIAGNOSIS — J301 Allergic rhinitis due to pollen: Secondary | ICD-10-CM | POA: Diagnosis not present

## 2016-05-02 DIAGNOSIS — J301 Allergic rhinitis due to pollen: Secondary | ICD-10-CM | POA: Diagnosis not present

## 2016-05-04 ENCOUNTER — Ambulatory Visit (INDEPENDENT_AMBULATORY_CARE_PROVIDER_SITE_OTHER): Payer: Medicare Other

## 2016-05-04 DIAGNOSIS — Z23 Encounter for immunization: Secondary | ICD-10-CM | POA: Diagnosis not present

## 2016-05-06 DIAGNOSIS — J301 Allergic rhinitis due to pollen: Secondary | ICD-10-CM | POA: Diagnosis not present

## 2016-05-07 ENCOUNTER — Other Ambulatory Visit: Payer: Self-pay | Admitting: Physician Assistant

## 2016-05-07 DIAGNOSIS — E7800 Pure hypercholesterolemia, unspecified: Secondary | ICD-10-CM

## 2016-05-09 DIAGNOSIS — J301 Allergic rhinitis due to pollen: Secondary | ICD-10-CM | POA: Diagnosis not present

## 2016-05-13 DIAGNOSIS — J301 Allergic rhinitis due to pollen: Secondary | ICD-10-CM | POA: Diagnosis not present

## 2016-05-16 DIAGNOSIS — J301 Allergic rhinitis due to pollen: Secondary | ICD-10-CM | POA: Diagnosis not present

## 2016-05-20 DIAGNOSIS — J301 Allergic rhinitis due to pollen: Secondary | ICD-10-CM | POA: Diagnosis not present

## 2016-05-27 DIAGNOSIS — J301 Allergic rhinitis due to pollen: Secondary | ICD-10-CM | POA: Diagnosis not present

## 2016-06-03 DIAGNOSIS — J301 Allergic rhinitis due to pollen: Secondary | ICD-10-CM | POA: Diagnosis not present

## 2016-06-10 DIAGNOSIS — J301 Allergic rhinitis due to pollen: Secondary | ICD-10-CM | POA: Diagnosis not present

## 2016-06-17 DIAGNOSIS — J301 Allergic rhinitis due to pollen: Secondary | ICD-10-CM | POA: Diagnosis not present

## 2016-06-24 DIAGNOSIS — J301 Allergic rhinitis due to pollen: Secondary | ICD-10-CM | POA: Diagnosis not present

## 2016-06-26 DIAGNOSIS — D225 Melanocytic nevi of trunk: Secondary | ICD-10-CM | POA: Diagnosis not present

## 2016-06-26 DIAGNOSIS — X32XXXA Exposure to sunlight, initial encounter: Secondary | ICD-10-CM | POA: Diagnosis not present

## 2016-06-26 DIAGNOSIS — Z85820 Personal history of malignant melanoma of skin: Secondary | ICD-10-CM | POA: Diagnosis not present

## 2016-06-26 DIAGNOSIS — D2261 Melanocytic nevi of right upper limb, including shoulder: Secondary | ICD-10-CM | POA: Diagnosis not present

## 2016-06-26 DIAGNOSIS — Z85828 Personal history of other malignant neoplasm of skin: Secondary | ICD-10-CM | POA: Diagnosis not present

## 2016-06-26 DIAGNOSIS — L570 Actinic keratosis: Secondary | ICD-10-CM | POA: Diagnosis not present

## 2016-07-03 DIAGNOSIS — J301 Allergic rhinitis due to pollen: Secondary | ICD-10-CM | POA: Diagnosis not present

## 2016-07-10 DIAGNOSIS — J301 Allergic rhinitis due to pollen: Secondary | ICD-10-CM | POA: Diagnosis not present

## 2016-07-15 DIAGNOSIS — J301 Allergic rhinitis due to pollen: Secondary | ICD-10-CM | POA: Diagnosis not present

## 2016-07-22 DIAGNOSIS — J301 Allergic rhinitis due to pollen: Secondary | ICD-10-CM | POA: Diagnosis not present

## 2016-07-26 DIAGNOSIS — J301 Allergic rhinitis due to pollen: Secondary | ICD-10-CM | POA: Diagnosis not present

## 2016-07-29 DIAGNOSIS — J301 Allergic rhinitis due to pollen: Secondary | ICD-10-CM | POA: Diagnosis not present

## 2016-08-05 DIAGNOSIS — J301 Allergic rhinitis due to pollen: Secondary | ICD-10-CM | POA: Diagnosis not present

## 2016-08-12 DIAGNOSIS — J301 Allergic rhinitis due to pollen: Secondary | ICD-10-CM | POA: Diagnosis not present

## 2016-08-13 ENCOUNTER — Ambulatory Visit (INDEPENDENT_AMBULATORY_CARE_PROVIDER_SITE_OTHER): Payer: Medicare Other | Admitting: Physician Assistant

## 2016-08-13 ENCOUNTER — Other Ambulatory Visit: Payer: Self-pay | Admitting: Physician Assistant

## 2016-08-13 ENCOUNTER — Encounter: Payer: Self-pay | Admitting: Physician Assistant

## 2016-08-13 ENCOUNTER — Other Ambulatory Visit: Payer: Self-pay

## 2016-08-13 VITALS — BP 132/70 | HR 82 | Temp 98.4°F | Resp 16 | Ht 66.0 in | Wt 148.0 lb

## 2016-08-13 DIAGNOSIS — E7800 Pure hypercholesterolemia, unspecified: Secondary | ICD-10-CM

## 2016-08-13 DIAGNOSIS — R05 Cough: Secondary | ICD-10-CM

## 2016-08-13 DIAGNOSIS — Z1231 Encounter for screening mammogram for malignant neoplasm of breast: Secondary | ICD-10-CM

## 2016-08-13 DIAGNOSIS — R059 Cough, unspecified: Secondary | ICD-10-CM

## 2016-08-13 MED ORDER — PANTOPRAZOLE SODIUM 40 MG PO TBEC: 40.0000 mg | DELAYED_RELEASE_TABLET | Freq: Every day | ORAL | 3 refills | Status: DC

## 2016-08-13 NOTE — Patient Instructions (Signed)
Pantoprazole tablets What is this medicine? PANTOPRAZOLE (pan TOE pra zole) prevents the production of acid in the stomach. It is used to treat gastroesophageal reflux disease (GERD), inflammation of the esophagus, and Zollinger-Ellison syndrome. This medicine may be used for other purposes; ask your health care provider or pharmacist if you have questions. COMMON BRAND NAME(S): Protonix What should I tell my health care provider before I take this medicine? They need to know if you have any of these conditions: -liver disease -low levels of magnesium in the blood -lupus -an unusual or allergic reaction to omeprazole, lansoprazole, pantoprazole, rabeprazole, other medicines, foods, dyes, or preservatives -pregnant or trying to get pregnant -breast-feeding How should I use this medicine? Take this medicine by mouth. Swallow the tablets whole with a drink of water. Follow the directions on the prescription label. Do not crush, break, or chew. Take your medicine at regular intervals. Do not take your medicine more often than directed. Talk to your pediatrician regarding the use of this medicine in children. While this drug may be prescribed for children as young as 5 years for selected conditions, precautions do apply. Overdosage: If you think you have taken too much of this medicine contact a poison control center or emergency room at once. NOTE: This medicine is only for you. Do not share this medicine with others. What if I miss a dose? If you miss a dose, take it as soon as you can. If it is almost time for your next dose, take only that dose. Do not take double or extra doses. What may interact with this medicine? Do not take this medicine with any of the following medications: -atazanavir -nelfinavir This medicine may also interact with the following medications: -ampicillin -delavirdine -erlotinib -iron salts -medicines for fungal infections like ketoconazole, itraconazole and  voriconazole -methotrexate -mycophenolate mofetil -warfarin This list may not describe all possible interactions. Give your health care provider a list of all the medicines, herbs, non-prescription drugs, or dietary supplements you use. Also tell them if you smoke, drink alcohol, or use illegal drugs. Some items may interact with your medicine. What should I watch for while using this medicine? It can take several days before your stomach pain gets better. Check with your doctor or health care professional if your condition does not start to get better, or if it gets worse. You may need blood work done while you are taking this medicine. What side effects may I notice from receiving this medicine? Side effects that you should report to your doctor or health care professional as soon as possible: -allergic reactions like skin rash, itching or hives, swelling of the face, lips, or tongue -bone, muscle or joint pain -breathing problems -chest pain or chest tightness -dark yellow or brown urine -dizziness -fast, irregular heartbeat -feeling faint or lightheaded -fever or sore throat -muscle spasm -palpitations -rash on cheeks or arms that gets worse in the sun -redness, blistering, peeling or loosening of the skin, including inside the mouth -seizures -tremors -unusual bleeding or bruising -unusually weak or tired -yellowing of the eyes or skin Side effects that usually do not require medical attention (report to your doctor or health care professional if they continue or are bothersome): -constipation -diarrhea -dry mouth -headache -nausea This list may not describe all possible side effects. Call your doctor for medical advice about side effects. You may report side effects to FDA at 1-800-FDA-1088. Where should I keep my medicine? Keep out of the reach of children. Store at room  temperature between 15 and 30 degrees C (59 and 86 degrees F). Protect from light and moisture. Throw  away any unused medicine after the expiration date. NOTE: This sheet is a summary. It may not cover all possible information. If you have questions about this medicine, talk to your doctor, pharmacist, or health care provider.  2017 Elsevier/Gold Standard (2015-07-27 12:20:19) Azelastine; Fluticasone nasal spray What is this medicine? AZELASTINE; FLUTICASONE (a ZEL as teen; floo TIK a sone) is a combination of a histamine blocker and a corticosteroid. This medicine is used to treat the symptoms of allergies like sneezing, itching, and runny or stuffy nose. COMMON BRAND NAME(S): Dymista What should I tell my health care provider before I take this medicine? They need to know if you have any of these conditions: -cataracts -glaucoma -infection, like tuberculosis, herpes, or fungal infection -recent surgery or injury of the nose or sinuses -taking a corticosteroid by mouth -an unusual or allergic reaction to azelastine, fluticasone, steroids, other medicines, foods, dyes, or preservatives -pregnant or trying to get pregnant -breast-feeding How should I use this medicine? This medicine is for use in the nose. Follow the directions on the prescription label. Shake well before using. Do not use more often than directed. Make sure that you are using your nasal spray correctly. Ask you doctor or health care provider if you have any questions. Talk to your pediatrician regarding the use of this medicine in children. While this drug may be prescribed for children as young as 6 years for selected conditions, precautions do apply. What if I miss a dose? If you miss a dose, use it as soon as you can. If it is almost time for your next dose, use only that dose. Do not use double or extra doses. What may interact with this medicine? -alcohol -certain medicines for anxiety or sleep -cimetidine -ketoconazole -metyrapone -other antihistamines -some medicines for HIV -vaccines What should I watch for  while using this medicine? Tell your doctor or healthcare professional if your symptoms do not start to get better or if they get worse. You may get drowsy or dizzy. Drinking alcohol or taking medicine that causes drowsiness can make this worse. Do not drive, use machinery, or do anything that needs mental alertness until you know how this medicine affects you. This medicine may increase your risk of getting an infection. Tell your doctor or health care professional if you are around anyone with measles or chickenpox, or if you develop sores or blisters that do not heal properly. What side effects may I notice from receiving this medicine? Side effects that you should report to your doctor or health care professional as soon as possible: -allergic reactions like skin rash, itching or hives, swelling of the face, lips, or tongue -breathing problems -changes in vision -fast heartbeat -flu-like symptoms -high blood pressure -infection -nose bleeding, sores -white patches or sores in the mouth or nose Side effects that usually do not require medical attention (report to your doctor or health care professional if they continue or are bothersome): -changes in smell or taste -cough -feeling tired -headache -larger appetite or weight gain -nose or throat irritation -sneezing Where should I keep my medicine? Keep out of the reach of children. Store upright and tightly closed at room temperature between 20 and 25 degrees C (68 and 77 degrees F). Do not freeze. Throw away any unused medicine after the expiration date or after 120 sprays, whichever comes first.  2017 Elsevier/Gold Standard (2015-07-27 12:04:59)

## 2016-08-13 NOTE — Progress Notes (Signed)
Patient: Rita Webb Female    DOB: Apr 12, 1946   71 y.o.   MRN: [id number] Visit Date: 08/13/2016  Today's Provider: Mar Daring, PA-C   Chief Complaint  Patient presents with  . Hyperlipidemia   Subjective:    HPI  Lipid/Cholesterol, Follow-up:   Last seen for this 6 months ago.  Management changes since that visit include check labs. . Last Lipid Panel:    Component Value Date/Time   CHOL 260 (H) 02/07/2016 1010   TRIG 121 02/07/2016 1010   HDL 56 02/07/2016 1010   CHOLHDL 4.6 (H) 02/07/2016 1010   LDLCALC 180 (H) 02/07/2016 1010    Risk factors for vascular disease include hypercholesterolemia  She reports excellent compliance with treatment. She is not having side effects.  Current symptoms include none and have been stable. Weight trend: stable Prior visit with dietician: no Current diet: in general, a "healthy" diet   Current exercise: none  Wt Readings from Last 3 Encounters:  08/13/16 148 lb (67.1 kg)  02/07/16 151 lb (68.5 kg)  03/09/15 144 lb (65.3 kg)   -------------------------------------------------------------------  Patient c/o persistent cough that been going on for about 6 years. Patient reports that she was advised by Dr. Venia Minks to take an acid reflux medication, reports no symptom control. Patient reports she took the OTC medication for about 2 weeks. Patient reports she has PND, runny nose, scratchy throat and cough all day long. Patient is been a seen by ENT for allergy shots. Patient reports she is now getting on injection once a week, but has not noticed any improvement with cough.     Allergies  Allergen Reactions  . Cefuroxime Diarrhea   Patient Active Problem List   Diagnosis Date Noted  . Osteoporosis 02/27/2015  . Allergic rhinitis 11/11/2014  . Hypercholesteremia 11/11/2014  . Abdominal malignancy ([place]) 11/11/2014  . Avitaminosis D 11/11/2014     Current Outpatient Prescriptions:  .  azelastine  (ASTELIN) 0.1 % nasal spray, Place 1 spray into both nostrils. , Disp: , Rfl: 1 .  B COMPLEX-C PO, Take 1 tablet by mouth daily. , Disp: , Rfl:  .  Calcium Carbonate-Vitamin D 600-200 MG-UNIT CAPS, Take 2 capsules by mouth daily. , Disp: , Rfl:  .  Cetirizine HCl 10 MG CAPS, Take 1 capsule by mouth daily. , Disp: , Rfl:  .  Cholecalciferol 1000 UNITS capsule, Take 1,000 Units by mouth daily. , Disp: , Rfl:  .  Ibuprofen 200 MG CAPS, Take 1 capsule by mouth as needed. , Disp: , Rfl:  .  montelukast (SINGULAIR) 10 MG tablet, Take 10 mg by mouth at bedtime. , Disp: , Rfl:  .  MULTIPLE VITAMIN PO, Take 1 tablet by mouth daily. , Disp: , Rfl:  .  Omega-3 Fatty Acids (FISH OIL) 1200 MG CAPS, Take 1 capsule by mouth daily. , Disp: , Rfl:  .  psyllium (REGULOID) 0.52 G capsule, Take 1 capsule by mouth 6 (six) times daily. , Disp: , Rfl:  .  simvastatin (ZOCOR) 10 MG tablet, TAKE 1 TABLET BY MOUTH AT BEDTIME, Disp: 90 tablet, Rfl: 3 .  vitamin E 400 UNIT capsule, Take 400 Units by mouth daily., Disp: , Rfl:   Review of Systems  Constitutional: Negative.   HENT: Positive for congestion, postnasal drip and rhinorrhea.   Respiratory: Positive for cough.   Cardiovascular: Negative.   Gastrointestinal: Negative.     Social History  Substance Use Topics  .  Smoking status: Former Smoker    Quit date: 11/10/1968  . Smokeless tobacco: Never Used  . Alcohol use No   Objective:   BP 132/70 (BP Location: Right Arm, Patient Position: Sitting, Cuff Size: Normal)   Pulse 82   Temp 98.4 F (36.9 C) (Oral)   Resp 16   Ht '5\' 6"'$  (1.676 m)   Wt 148 lb (67.1 kg)   SpO2 96%   BMI 23.89 kg/m   Physical Exam  Constitutional: She appears well-developed and well-nourished. No distress.  Neck: Normal range of motion. Neck supple. No tracheal deviation present. No thyromegaly present.  Cardiovascular: Normal rate, regular rhythm and normal heart sounds.  Exam reveals no gallop and no friction rub.   No murmur  heard. Pulmonary/Chest: Effort normal and breath sounds normal. No respiratory distress. She has no wheezes. She has no rales.  Lymphadenopathy:    She has no cervical adenopathy.  Skin: She is not diaphoretic.  Psychiatric: She has a normal mood and affect. Her behavior is normal. Judgment and thought content normal.  Vitals reviewed.     Assessment & Plan:     1. Hypercholesterolemia Stable and tolerating simvastatin '10mg'$ . Will check labs as below and f/u pending results. - Lipid Profile  2. Cough Cough persistent x 6 years without relief. Followed by ENT Tami Ribas) for allergies and now on allergy shots. She also continues her zyrtec, astelin nasal spray and singulair. She has tried omeprazole OTC x 2 weeks without relief as well. I will change her to protonix as below and add Dymista and have her stop her astelin. If she tolerates this and has success we can send in Rx. Patient was also given a Rx card for discounted cost. She is to call the office if symptoms improve for the Rx of Dymista.  - pantoprazole (PROTONIX) 40 MG tablet; Take 1 tablet (40 mg total) by mouth daily.  Dispense: 30 tablet; Refill: [place], PA-C  [facility]

## 2016-08-14 ENCOUNTER — Telehealth: Payer: Self-pay

## 2016-08-14 DIAGNOSIS — H43811 Vitreous degeneration, right eye: Secondary | ICD-10-CM | POA: Diagnosis not present

## 2016-08-14 DIAGNOSIS — H40003 Preglaucoma, unspecified, bilateral: Secondary | ICD-10-CM | POA: Diagnosis not present

## 2016-08-14 LAB — LIPID PANEL
Chol/HDL Ratio: 3.1 ratio units (ref 0.0–4.4)
Cholesterol, Total: 158 mg/dL (ref 100–199)
HDL: 51 mg/dL (ref >39)
LDL Calculated: 84 mg/dL (ref 0–99)
TRIGLYCERIDES: 116 mg/dL (ref 0–149)
VLDL Cholesterol Cal: 23 mg/dL (ref 5–40)

## 2016-08-14 NOTE — Telephone Encounter (Signed)
Patient advised as below.  

## 2016-08-14 NOTE — Telephone Encounter (Signed)
-----   Message from Mar Daring, PA-C sent at 08/14/2016  8:22 AM EST ----- Cholesterol much improved.

## 2016-08-19 DIAGNOSIS — J301 Allergic rhinitis due to pollen: Secondary | ICD-10-CM | POA: Diagnosis not present

## 2016-08-26 DIAGNOSIS — J301 Allergic rhinitis due to pollen: Secondary | ICD-10-CM | POA: Diagnosis not present

## 2016-09-02 DIAGNOSIS — J301 Allergic rhinitis due to pollen: Secondary | ICD-10-CM | POA: Diagnosis not present

## 2016-09-09 DIAGNOSIS — J301 Allergic rhinitis due to pollen: Secondary | ICD-10-CM | POA: Diagnosis not present

## 2016-09-16 DIAGNOSIS — J301 Allergic rhinitis due to pollen: Secondary | ICD-10-CM | POA: Diagnosis not present

## 2016-09-20 ENCOUNTER — Ambulatory Visit
Admission: RE | Admit: 2016-09-20 | Discharge: 2016-09-20 | Disposition: A | Discharge status: Home / Self Care | Payer: Medicare Other | Source: Ambulatory Visit

## 2016-09-20 DIAGNOSIS — Z1231 Encounter for screening mammogram for malignant neoplasm of breast: Secondary | ICD-10-CM

## 2016-09-23 DIAGNOSIS — J301 Allergic rhinitis due to pollen: Secondary | ICD-10-CM | POA: Diagnosis not present

## 2016-09-30 DIAGNOSIS — J301 Allergic rhinitis due to pollen: Secondary | ICD-10-CM | POA: Diagnosis not present

## 2016-10-07 DIAGNOSIS — J301 Allergic rhinitis due to pollen: Secondary | ICD-10-CM | POA: Diagnosis not present

## 2016-10-14 DIAGNOSIS — J301 Allergic rhinitis due to pollen: Secondary | ICD-10-CM | POA: Diagnosis not present

## 2016-10-21 DIAGNOSIS — J301 Allergic rhinitis due to pollen: Secondary | ICD-10-CM | POA: Diagnosis not present

## 2016-10-25 DIAGNOSIS — J301 Allergic rhinitis due to pollen: Secondary | ICD-10-CM | POA: Diagnosis not present

## 2016-10-28 DIAGNOSIS — J301 Allergic rhinitis due to pollen: Secondary | ICD-10-CM | POA: Diagnosis not present

## 2016-11-04 DIAGNOSIS — J301 Allergic rhinitis due to pollen: Secondary | ICD-10-CM | POA: Diagnosis not present

## 2016-11-11 DIAGNOSIS — J301 Allergic rhinitis due to pollen: Secondary | ICD-10-CM | POA: Diagnosis not present

## 2016-11-18 DIAGNOSIS — J301 Allergic rhinitis due to pollen: Secondary | ICD-10-CM | POA: Diagnosis not present

## 2016-11-25 DIAGNOSIS — J301 Allergic rhinitis due to pollen: Secondary | ICD-10-CM | POA: Diagnosis not present

## 2016-12-04 DIAGNOSIS — J301 Allergic rhinitis due to pollen: Secondary | ICD-10-CM | POA: Diagnosis not present

## 2016-12-09 DIAGNOSIS — J301 Allergic rhinitis due to pollen: Secondary | ICD-10-CM | POA: Diagnosis not present

## 2016-12-18 DIAGNOSIS — J301 Allergic rhinitis due to pollen: Secondary | ICD-10-CM | POA: Diagnosis not present

## 2016-12-23 DIAGNOSIS — J301 Allergic rhinitis due to pollen: Secondary | ICD-10-CM | POA: Diagnosis not present

## 2016-12-30 DIAGNOSIS — J301 Allergic rhinitis due to pollen: Secondary | ICD-10-CM | POA: Diagnosis not present

## 2017-01-06 DIAGNOSIS — J301 Allergic rhinitis due to pollen: Secondary | ICD-10-CM | POA: Diagnosis not present

## 2017-01-13 DIAGNOSIS — J301 Allergic rhinitis due to pollen: Secondary | ICD-10-CM | POA: Diagnosis not present

## 2017-01-17 DIAGNOSIS — J301 Allergic rhinitis due to pollen: Secondary | ICD-10-CM | POA: Diagnosis not present

## 2017-01-20 DIAGNOSIS — J301 Allergic rhinitis due to pollen: Secondary | ICD-10-CM | POA: Diagnosis not present

## 2017-01-27 DIAGNOSIS — J301 Allergic rhinitis due to pollen: Secondary | ICD-10-CM | POA: Diagnosis not present

## 2017-02-03 ENCOUNTER — Other Ambulatory Visit: Payer: Self-pay | Admitting: Physician Assistant

## 2017-02-03 DIAGNOSIS — J301 Allergic rhinitis due to pollen: Secondary | ICD-10-CM | POA: Diagnosis not present

## 2017-02-03 DIAGNOSIS — E7800 Pure hypercholesterolemia, unspecified: Secondary | ICD-10-CM

## 2017-02-07 ENCOUNTER — Encounter: Payer: Medicare Other | Admitting: Physician Assistant

## 2017-02-07 ENCOUNTER — Ambulatory Visit: Payer: Medicare Other

## 2017-02-10 DIAGNOSIS — J301 Allergic rhinitis due to pollen: Secondary | ICD-10-CM | POA: Diagnosis not present

## 2017-02-11 ENCOUNTER — Ambulatory Visit: Payer: Medicare Other

## 2017-02-13 ENCOUNTER — Ambulatory Visit (INDEPENDENT_AMBULATORY_CARE_PROVIDER_SITE_OTHER): Payer: Medicare Other | Admitting: Physician Assistant

## 2017-02-13 ENCOUNTER — Encounter: Payer: Self-pay | Admitting: Physician Assistant

## 2017-02-13 VITALS — BP 112/76 | HR 68 | Temp 98.1°F | Resp 16 | Ht 66.0 in | Wt 146.6 lb

## 2017-02-13 DIAGNOSIS — Z803 Family history of malignant neoplasm of breast: Secondary | ICD-10-CM | POA: Insufficient documentation

## 2017-02-13 DIAGNOSIS — E559 Vitamin D deficiency, unspecified: Secondary | ICD-10-CM

## 2017-02-13 DIAGNOSIS — M810 Age-related osteoporosis without current pathological fracture: Secondary | ICD-10-CM

## 2017-02-13 DIAGNOSIS — Z1231 Encounter for screening mammogram for malignant neoplasm of breast: Secondary | ICD-10-CM

## 2017-02-13 DIAGNOSIS — E7800 Pure hypercholesterolemia, unspecified: Secondary | ICD-10-CM

## 2017-02-13 DIAGNOSIS — Z1239 Encounter for other screening for malignant neoplasm of breast: Secondary | ICD-10-CM

## 2017-02-13 DIAGNOSIS — Z85820 Personal history of malignant melanoma of skin: Secondary | ICD-10-CM

## 2017-02-13 DIAGNOSIS — Z0000 Encounter for general adult medical examination without abnormal findings: Secondary | ICD-10-CM

## 2017-02-13 DIAGNOSIS — Z1211 Encounter for screening for malignant neoplasm of colon: Secondary | ICD-10-CM | POA: Diagnosis not present

## 2017-02-13 LAB — IFOBT (OCCULT BLOOD): IMMUNOLOGICAL FECAL OCCULT BLOOD TEST: NEGATIVE

## 2017-02-13 NOTE — Patient Instructions (Signed)
Health Maintenance for Postmenopausal Women Menopause is a normal process in which your reproductive ability comes to an end. This process happens gradually over a span of months to years, usually between the ages of 22 and 9. Menopause is complete when you have missed 12 consecutive menstrual periods. It is important to talk with your health care provider about some of the most common conditions that affect postmenopausal women, such as heart disease, cancer, and bone loss (osteoporosis). Adopting a healthy lifestyle and getting preventive care can help to promote your health and wellness. Those actions can also lower your chances of developing some of these common conditions. What should I know about menopause? During menopause, you may experience a number of symptoms, such as:  Moderate-to-severe hot flashes.  Night sweats.  Decrease in sex drive.  Mood swings.  Headaches.  Tiredness.  Irritability.  Memory problems.  Insomnia.  Choosing to treat or not to treat menopausal changes is an individual decision that you make with your health care provider. What should I know about hormone replacement therapy and supplements? Hormone therapy products are effective for treating symptoms that are associated with menopause, such as hot flashes and night sweats. Hormone replacement carries certain risks, especially as you become older. If you are thinking about using estrogen or estrogen with progestin treatments, discuss the benefits and risks with your health care provider. What should I know about heart disease and stroke? Heart disease, heart attack, and stroke become more likely as you age. This may be due, in part, to the hormonal changes that your body experiences during menopause. These can affect how your body processes dietary fats, triglycerides, and cholesterol. Heart attack and stroke are both medical emergencies. There are many things that you can do to help prevent heart disease  and stroke:  Have your blood pressure checked at least every 1-2 years. High blood pressure causes heart disease and increases the risk of stroke.  If you are 53-22 years old, ask your health care provider if you should take aspirin to prevent a heart attack or a stroke.  Do not use any tobacco products, including cigarettes, chewing tobacco, or electronic cigarettes. If you need help quitting, ask your health care provider.  It is important to eat a healthy diet and maintain a healthy weight. ? Be sure to include plenty of vegetables, fruits, low-fat dairy products, and lean protein. ? Avoid eating foods that are high in solid fats, added sugars, or salt (sodium).  Get regular exercise. This is one of the most important things that you can do for your health. ? Try to exercise for at least 150 minutes each week. The type of exercise that you do should increase your heart rate and make you sweat. This is known as moderate-intensity exercise. ? Try to do strengthening exercises at least twice each week. Do these in addition to the moderate-intensity exercise.  Know your numbers.Ask your health care provider to check your cholesterol and your blood glucose. Continue to have your blood tested as directed by your health care provider.  What should I know about cancer screening? There are several types of cancer. Take the following steps to reduce your risk and to catch any cancer development as early as possible. Breast Cancer  Practice breast self-awareness. ? This means understanding how your breasts normally appear and feel. ? It also means doing regular breast self-exams. Let your health care provider know about any changes, no matter how small.  If you are 40  or older, have a clinician do a breast exam (clinical breast exam or CBE) every year. Depending on your age, family history, and medical history, it may be recommended that you also have a yearly breast X-ray (mammogram).  If you  have a family history of breast cancer, talk with your health care provider about genetic screening.  If you are at high risk for breast cancer, talk with your health care provider about having an MRI and a mammogram every year.  Breast cancer (BRCA) gene test is recommended for women who have family members with BRCA-related cancers. Results of the assessment will determine the need for genetic counseling and BRCA1 and for BRCA2 testing. BRCA-related cancers include these types: ? Breast. This occurs in males or females. ? Ovarian. ? Tubal. This may also be called fallopian tube cancer. ? Cancer of the abdominal or pelvic lining (peritoneal cancer). ? Prostate. ? Pancreatic.  Cervical, Uterine, and Ovarian Cancer Your health care provider may recommend that you be screened regularly for cancer of the pelvic organs. These include your ovaries, uterus, and vagina. This screening involves a pelvic exam, which includes checking for microscopic changes to the surface of your cervix (Pap test).  For women ages 21-65, health care providers may recommend a pelvic exam and a Pap test every three years. For women ages 79-65, they may recommend the Pap test and pelvic exam, combined with testing for human papilloma virus (HPV), every five years. Some types of HPV increase your risk of cervical cancer. Testing for HPV may also be done on women of any age who have unclear Pap test results.  Other health care providers may not recommend any screening for nonpregnant women who are considered low risk for pelvic cancer and have no symptoms. Ask your health care provider if a screening pelvic exam is right for you.  If you have had past treatment for cervical cancer or a condition that could lead to cancer, you need Pap tests and screening for cancer for at least 20 years after your treatment. If Pap tests have been discontinued for you, your risk factors (such as having a new sexual partner) need to be  reassessed to determine if you should start having screenings again. Some women have medical problems that increase the chance of getting cervical cancer. In these cases, your health care provider may recommend that you have screening and Pap tests more often.  If you have a family history of uterine cancer or ovarian cancer, talk with your health care provider about genetic screening.  If you have vaginal bleeding after reaching menopause, tell your health care provider.  There are currently no reliable tests available to screen for ovarian cancer.  Lung Cancer Lung cancer screening is recommended for adults 69-62 years old who are at high risk for lung cancer because of a history of smoking. A yearly low-dose CT scan of the lungs is recommended if you:  Currently smoke.  Have a history of at least 30 pack-years of smoking and you currently smoke or have quit within the past 15 years. A pack-year is smoking an average of one pack of cigarettes per day for one year.  Yearly screening should:  Continue until it has been 15 years since you quit.  Stop if you develop a health problem that would prevent you from having lung cancer treatment.  Colorectal Cancer  This type of cancer can be detected and can often be prevented.  Routine colorectal cancer screening usually begins at  age 42 and continues through age 45.  If you have risk factors for colon cancer, your health care provider may recommend that you be screened at an earlier age.  If you have a family history of colorectal cancer, talk with your health care provider about genetic screening.  Your health care provider may also recommend using home test kits to check for hidden blood in your stool.  A small camera at the end of a tube can be used to examine your colon directly (sigmoidoscopy or colonoscopy). This is done to check for the earliest forms of colorectal cancer.  Direct examination of the colon should be repeated every  5-10 years until age 71. However, if early forms of precancerous polyps or small growths are found or if you have a family history or genetic risk for colorectal cancer, you may need to be screened more often.  Skin Cancer  Check your skin from head to toe regularly.  Monitor any moles. Be sure to tell your health care provider: ? About any new moles or changes in moles, especially if there is a change in a mole's shape or color. ? If you have a mole that is larger than the size of a pencil eraser.  If any of your family members has a history of skin cancer, especially at a young age, talk with your health care provider about genetic screening.  Always use sunscreen. Apply sunscreen liberally and repeatedly throughout the day.  Whenever you are outside, protect yourself by wearing long sleeves, pants, a wide-brimmed hat, and sunglasses.  What should I know about osteoporosis? Osteoporosis is a condition in which bone destruction happens more quickly than new bone creation. After menopause, you may be at an increased risk for osteoporosis. To help prevent osteoporosis or the bone fractures that can happen because of osteoporosis, the following is recommended:  If you are 46-71 years old, get at least 1,000 mg of calcium and at least 600 mg of vitamin D per day.  If you are older than age 55 but younger than age 65, get at least 1,200 mg of calcium and at least 600 mg of vitamin D per day.  If you are older than age 54, get at least 1,200 mg of calcium and at least 800 mg of vitamin D per day.  Smoking and excessive alcohol intake increase the risk of osteoporosis. Eat foods that are rich in calcium and vitamin D, and do weight-bearing exercises several times each week as directed by your health care provider. What should I know about how menopause affects my mental health? Depression may occur at any age, but it is more common as you become older. Common symptoms of depression  include:  Low or sad mood.  Changes in sleep patterns.  Changes in appetite or eating patterns.  Feeling an overall lack of motivation or enjoyment of activities that you previously enjoyed.  Frequent crying spells.  Talk with your health care provider if you think that you are experiencing depression. What should I know about immunizations? It is important that you get and maintain your immunizations. These include:  Tetanus, diphtheria, and pertussis (Tdap) booster vaccine.  Influenza every year before the flu season begins.  Pneumonia vaccine.  Shingles vaccine.  Your health care provider may also recommend other immunizations. This information is not intended to replace advice given to you by your health care provider. Make sure you discuss any questions you have with your health care provider. Document Released: 08/16/2005  Document Revised: 01/12/2016 Document Reviewed: 03/28/2015 Elsevier Interactive Patient Education  2018 Elsevier Inc[employer]  

## 2017-02-13 NOTE — Progress Notes (Signed)
Patient: Rita Webb, Female    DOB: September 13, 1945, 71 y.o.   MRN: [id number] Visit Date: 02/13/2017  Today's Provider: Mar Daring, PA-C   Chief Complaint  Patient presents with  . Medicare Wellness   Subjective:    Annual wellness visit Rita Webb is a 71 y.o. female. She feels well. She reports exercising none. She reports she is sleeping well.  02/07/16 CPE 09/20/16 Mammogram-BI-RADS 1 02/08/15 BMD-Osteoporosis 11/24/00 Colonoscopy-spastic colon; then she had another colonoscopy in 2013 at [facility] with Dr. Tyson Babinski, reports normal and repeat in 10 years Had eye exam in Feb 2018 Followed by Dr. Evorn Gong for dermatology -----------------------------------------------------------   Review of Systems  Constitutional: Negative.   HENT: Positive for congestion, postnasal drip, rhinorrhea, sinus pain, sinus pressure and sneezing.   Eyes: Negative.   Respiratory: Positive for cough.   Cardiovascular: Negative.   Gastrointestinal: Negative.   Endocrine: Negative.   Genitourinary: Negative.   Musculoskeletal: Positive for back pain.  Skin: Negative.   Allergic/Immunologic: Positive for environmental allergies.  Neurological: Negative.   Hematological: Bruises/bleeds easily.  Psychiatric/Behavioral: Negative.     Social History   Social History  . Marital status: Married    Spouse name: N/A  . Number of children: 2  . Years of education: N/A   Occupational History  . Not on file.   Social History Main Topics  . Smoking status: Former Smoker    Quit date: 11/10/1968  . Smokeless tobacco: Never Used  . Alcohol use No  . Drug use: No  . Sexual activity: Not on file   Other Topics Concern  . Not on file   Social History Narrative  . No narrative on file    Past Medical History:  Diagnosis Date  . Allergy   . Hyperlipidemia      Patient Active Problem List   Diagnosis Date Noted  . Osteoporosis 02/27/2015  . Allergic rhinitis  11/11/2014  . Hypercholesteremia 11/11/2014  . Abdominal malignancy ([place]) 11/11/2014  . Avitaminosis D 11/11/2014    Past Surgical History:  Procedure Laterality Date  . GANGLION CYST EXCISION Right 1996   wrist  . MELANOMA EXCISION  08/2011  . SPINE SURGERY      Her family history includes Breast cancer in her mother; Diabetes in her maternal aunt and maternal uncle; Prostate cancer in her father.      Current Outpatient Prescriptions:  .  azelastine (ASTELIN) 0.1 % nasal spray, Place 1 spray into both nostrils. , Disp: , Rfl: 1 .  B COMPLEX-C PO, Take 1 tablet by mouth daily. , Disp: , Rfl:  .  Calcium Carbonate-Vitamin D 600-200 MG-UNIT CAPS, Take 2 capsules by mouth daily. , Disp: , Rfl:  .  Cetirizine HCl 10 MG CAPS, Take 1 capsule by mouth daily. , Disp: , Rfl:  .  Cholecalciferol 1000 UNITS capsule, Take 1,000 Units by mouth daily. , Disp: , Rfl:  .  Ibuprofen 200 MG CAPS, Take 1 capsule by mouth as needed. , Disp: , Rfl:  .  montelukast (SINGULAIR) 10 MG tablet, Take 10 mg by mouth at bedtime. , Disp: , Rfl:  .  MULTIPLE VITAMIN PO, Take 1 tablet by mouth daily. , Disp: , Rfl:  .  Omega-3 Fatty Acids (FISH OIL) 1200 MG CAPS, Take 1 capsule by mouth daily. , Disp: , Rfl:  .  psyllium (REGULOID) 0.52 G capsule, Take 1 capsule by mouth 6 (six) times daily. , Disp: ,  Rfl:  .  simvastatin (ZOCOR) 10 MG tablet, TAKE 1 TABLET BY MOUTH AT BEDTIME, Disp: 90 tablet, Rfl: 1 .  vitamin E 400 UNIT capsule, Take 400 Units by mouth daily., Disp: , Rfl:   Patient Care Team: Mar Daring, PA-C as PCP - General (Family Medicine)     Objective:   Vitals: BP 112/76 (BP Location: Left Arm, Patient Position: Sitting, Cuff Size: Normal)   Pulse 68   Temp 98.1 F (36.7 C) (Oral)   Resp 16   Wt 146 lb 9.6 oz (66.5 kg)   SpO2 96%   BMI 23.66 kg/m   Physical Exam  Constitutional: She is oriented to person, place, and time. She appears well-developed and well-nourished. No  distress.  HENT:  Head: Normocephalic and atraumatic.  Right Ear: Hearing, tympanic membrane, external ear and ear canal normal.  Left Ear: Hearing, tympanic membrane, external ear and ear canal normal.  Nose: Nose normal.  Mouth/Throat: Uvula is midline, oropharynx is clear and moist and mucous membranes are normal. No oropharyngeal exudate.  Eyes: Pupils are equal, round, and reactive to light. Conjunctivae and EOM are normal. Right eye exhibits no discharge. Left eye exhibits no discharge. No scleral icterus.  Neck: Normal range of motion. Neck supple. No JVD present. Carotid bruit is not present. No tracheal deviation present. No thyromegaly present.  Cardiovascular: Normal rate, regular rhythm, normal heart sounds and intact distal pulses.  Exam reveals no gallop and no friction rub.   No murmur heard. Pulmonary/Chest: Effort normal and breath sounds normal. No respiratory distress. She has no wheezes. She has no rales. She exhibits no tenderness.  Abdominal: Soft. Bowel sounds are normal. She exhibits no distension and no mass. There is no tenderness. There is no rebound and no guarding.  Musculoskeletal: Normal range of motion. She exhibits no edema or tenderness.  Lymphadenopathy:    She has no cervical adenopathy.  Neurological: She is alert and oriented to person, place, and time.  Skin: Skin is warm and dry. No rash noted. She is not diaphoretic.  Psychiatric: She has a normal mood and affect. Her behavior is normal. Judgment and thought content normal.  Vitals reviewed.   Activities of Daily Living In your present state of health, do you have any difficulty performing the following activities: 02/13/2017  Hearing? N  Vision? N  Difficulty concentrating or making decisions? N  Walking or climbing stairs? N  Dressing or bathing? N  Doing errands, shopping? N  Some recent data might be hidden    Fall Risk Assessment Fall Risk  02/13/2017 02/07/2016 02/02/2015  Falls in the past  year? No No No     Depression Screen PHQ 2/9 Scores 02/13/2017 02/07/2016 02/02/2015  PHQ - 2 Score 0 0 0  PHQ- 9 Score 1 - -    Cognitive Testing - 6-CIT  Correct? Score   What year is it? yes 0 0 or 4  What month is it? yes 0 0 or 3  Memorize:    [name],  [birth year],  [place],      What time is it? (within 1 hour) yes 0 0 or 3  Count backwards from 20 yes 0 0, 2, or 4  Name the months of the year yes 0 0, 2, or 4  Repeat name & address above no 4 0, 2, 4, 6, 8, or 10       TOTAL SCORE  4/28   Interpretation:  Normal  Normal (0-7) Abnormal (8-28)     Assessment & Plan:     Annual Wellness Visit  Reviewed patient's Family Medical History Reviewed and updated list of patient's medical providers Assessment of cognitive impairment was done Assessed patient's functional ability Established a written schedule for health screening [place] Completed and Reviewed  Exercise Activities and Dietary recommendations Goals    . Increase physical activity       Immunization History  Administered Date(s) Administered  . Influenza, High Dose Seasonal PF 05/11/2015, 05/04/2016  . Pneumococcal Conjugate-13 12/29/2013  . Pneumococcal Polysaccharide-23 12/28/2012  . Tdap 11/27/2011  . Zoster 01/23/2011    Health Maintenance  Topic Date Due  . INFLUENZA VACCINE  02/05/2017  . MAMMOGRAM  09/21/2018  . TETANUS/TDAP  11/26/2021  . COLONOSCOPY  01/20/2022  . DEXA SCAN  Completed  . Hepatitis C Screening  Completed  . PNA vac Low Risk Adult  Completed     Discussed health benefits of physical activity, and encouraged her to engage in regular exercise appropriate for her age and condition.    1. Medicare annual wellness visit, subsequent Normal physical exam today. Up to date on vaccinations.   2. Screen for colon cancer OC lite collected today and was negative. - IFOBT POC (occult bld, rslt in office); Future  3. Breast cancer  screening Mammogram was done in 09/2016 and normal.   4. Family history of breast cancer in mother  8. Osteoporosis, unspecified osteoporosis type, unspecified pathological fracture presence Patient is using Calcium + Vit D supplementation. Due for BMD. Will recheck as below.  - DG Bone Density; Future  6. Avitaminosis D Will check labs as below and f/u pending results. On supplementation.  - VITAMIN D 25 Hydroxy (Vit-D Deficiency, Fractures)  7. Hypercholesterolemia Stable. Continue Simvastatin 10mg . Will check labs as below and f/u pending results. - CBC with Differential/Platelet - Comprehensive metabolic panel - Lipid Panel With LDL/HDL Ratio  8. History of malignant melanoma of skin Followed by Dr. Evorn Gong.   ------------------------------------------------------------------------------------------------------------    Mar Daring, PA-C  [facility]

## 2017-02-17 DIAGNOSIS — E7800 Pure hypercholesterolemia, unspecified: Secondary | ICD-10-CM | POA: Diagnosis not present

## 2017-02-17 DIAGNOSIS — J301 Allergic rhinitis due to pollen: Secondary | ICD-10-CM | POA: Diagnosis not present

## 2017-02-17 DIAGNOSIS — E559 Vitamin D deficiency, unspecified: Secondary | ICD-10-CM | POA: Diagnosis not present

## 2017-02-18 ENCOUNTER — Telehealth: Payer: Self-pay

## 2017-02-18 DIAGNOSIS — R05 Cough: Secondary | ICD-10-CM | POA: Diagnosis not present

## 2017-02-18 DIAGNOSIS — J309 Allergic rhinitis, unspecified: Secondary | ICD-10-CM | POA: Diagnosis not present

## 2017-02-18 LAB — LIPID PANEL WITH LDL/HDL RATIO
CHOLESTEROL TOTAL: 155 mg/dL (ref 100–199)
HDL: 55 mg/dL (ref >39)
LDL Calculated: 83 mg/dL (ref 0–99)
LDl/HDL Ratio: 1.5 ratio (ref 0.0–3.2)
TRIGLYCERIDES: 83 mg/dL (ref 0–149)
VLDL Cholesterol Cal: 17 mg/dL (ref 5–40)

## 2017-02-18 LAB — COMPREHENSIVE METABOLIC PANEL
ALK PHOS: 97 IU/L (ref 39–117)
ALT: 16 IU/L (ref 0–32)
AST: 19 IU/L (ref 0–40)
Albumin/Globulin Ratio: 2 (ref 1.2–2.2)
Albumin: 4.5 g/dL (ref 3.6–4.8)
BUN/Creatinine Ratio: 16 (ref 12–28)
BUN: 12 mg/dL (ref 8–27)
Bilirubin Total: 0.3 mg/dL (ref 0.0–1.2)
CO2: 23 mmol/L (ref 20–29)
Calcium: 9 mg/dL (ref 8.7–10.3)
Chloride: 105 mmol/L (ref 96–106)
Creatinine, Ser: 0.77 mg/dL (ref 0.57–1.00)
GFR calc Af Amer: 91 mL/min/{1.73_m2} (ref >59)
GFR calc non Af Amer: 79 mL/min/{1.73_m2} (ref >59)
GLOBULIN, TOTAL: 2.3 g/dL (ref 1.5–4.5)
Glucose: 102 mg/dL — ABNORMAL HIGH (ref 65–99)
POTASSIUM: 4.7 mmol/L (ref 3.5–5.2)
SODIUM: 143 mmol/L (ref 134–144)
Total Protein: 6.8 g/dL (ref 6.0–8.5)

## 2017-02-18 LAB — CBC WITH DIFFERENTIAL/PLATELET
BASOS: 0 %
Basophils Absolute: 0 10*3/uL (ref 0.0–0.2)
EOS (ABSOLUTE): 0.1 10*3/uL (ref 0.0–0.4)
EOS: 1 %
HEMATOCRIT: 39.8 % (ref 34.0–46.6)
Hemoglobin: 13.2 g/dL (ref 11.1–15.9)
Immature Grans (Abs): 0 10*3/uL (ref 0.0–0.1)
Immature Granulocytes: 0 %
LYMPHS ABS: 2.6 10*3/uL (ref 0.7–3.1)
Lymphs: 38 %
MCH: 30.3 pg (ref 26.6–33.0)
MCHC: 33.2 g/dL (ref 31.5–35.7)
MCV: 92 fL (ref 79–97)
MONOS ABS: 0.4 10*3/uL (ref 0.1–0.9)
Monocytes: 6 %
NEUTROS PCT: 55 %
Neutrophils Absolute: 3.7 10*3/uL (ref 1.4–7.0)
PLATELETS: 270 10*3/uL (ref 150–379)
RBC: 4.35 x10E6/uL (ref 3.77–5.28)
RDW: 13.3 % (ref 12.3–15.4)
WBC: 6.8 10*3/uL (ref 3.4–10.8)

## 2017-02-18 LAB — VITAMIN D 25 HYDROXY (VIT D DEFICIENCY, FRACTURES): Vit D, 25-Hydroxy: 59.7 ng/mL (ref 30.0–100.0)

## 2017-02-18 NOTE — Telephone Encounter (Signed)
No it was not a cyst or anything. It was just the labia were red which is common with postmenopausal women. Biggest thing to watch for would be uncontrolled itching from drying of the tissues.

## 2017-02-18 NOTE — Telephone Encounter (Signed)
-----   Message from Mar Daring, PA-C sent at 02/18/2017  7:48 AM EDT ----- All labs are within normal limits and stable.  Thanks! -JB

## 2017-02-18 NOTE — Telephone Encounter (Signed)
Advised patients husband. He is on consent form. Advised him that patient could call if any questions.

## 2017-02-18 NOTE — Telephone Encounter (Signed)
Advised patient as below. Patient reports that during her physical assessment, while you were checking her ovaries you mentioned that you saw something red down there. You told her that if her symptoms got worse, to let you know. She wanted to know if what you saw was some type of cyst or lesion? Please advise. Thanks!

## 2017-02-19 ENCOUNTER — Encounter: Payer: Self-pay | Admitting: Physician Assistant

## 2017-02-24 DIAGNOSIS — J301 Allergic rhinitis due to pollen: Secondary | ICD-10-CM | POA: Diagnosis not present

## 2017-03-03 DIAGNOSIS — J301 Allergic rhinitis due to pollen: Secondary | ICD-10-CM | POA: Diagnosis not present

## 2017-03-05 DIAGNOSIS — D2261 Melanocytic nevi of right upper limb, including shoulder: Secondary | ICD-10-CM | POA: Diagnosis not present

## 2017-03-05 DIAGNOSIS — Z85820 Personal history of malignant melanoma of skin: Secondary | ICD-10-CM | POA: Diagnosis not present

## 2017-03-05 DIAGNOSIS — Z85828 Personal history of other malignant neoplasm of skin: Secondary | ICD-10-CM | POA: Diagnosis not present

## 2017-03-05 DIAGNOSIS — D225 Melanocytic nevi of trunk: Secondary | ICD-10-CM | POA: Diagnosis not present

## 2017-03-12 DIAGNOSIS — J301 Allergic rhinitis due to pollen: Secondary | ICD-10-CM | POA: Diagnosis not present

## 2017-03-17 DIAGNOSIS — J301 Allergic rhinitis due to pollen: Secondary | ICD-10-CM | POA: Diagnosis not present

## 2017-03-24 DIAGNOSIS — J301 Allergic rhinitis due to pollen: Secondary | ICD-10-CM | POA: Diagnosis not present

## 2017-03-31 DIAGNOSIS — J301 Allergic rhinitis due to pollen: Secondary | ICD-10-CM | POA: Diagnosis not present

## 2017-04-07 DIAGNOSIS — J301 Allergic rhinitis due to pollen: Secondary | ICD-10-CM | POA: Diagnosis not present

## 2017-04-14 DIAGNOSIS — J301 Allergic rhinitis due to pollen: Secondary | ICD-10-CM | POA: Diagnosis not present

## 2017-04-18 DIAGNOSIS — J301 Allergic rhinitis due to pollen: Secondary | ICD-10-CM | POA: Diagnosis not present

## 2017-04-21 DIAGNOSIS — J301 Allergic rhinitis due to pollen: Secondary | ICD-10-CM | POA: Diagnosis not present

## 2017-04-28 DIAGNOSIS — J301 Allergic rhinitis due to pollen: Secondary | ICD-10-CM | POA: Diagnosis not present

## 2017-05-05 DIAGNOSIS — J301 Allergic rhinitis due to pollen: Secondary | ICD-10-CM | POA: Diagnosis not present

## 2017-05-08 ENCOUNTER — Ambulatory Visit (INDEPENDENT_AMBULATORY_CARE_PROVIDER_SITE_OTHER): Payer: Medicare Other

## 2017-05-08 DIAGNOSIS — Z23 Encounter for immunization: Secondary | ICD-10-CM | POA: Diagnosis not present

## 2017-05-12 DIAGNOSIS — J301 Allergic rhinitis due to pollen: Secondary | ICD-10-CM | POA: Diagnosis not present

## 2017-05-19 DIAGNOSIS — J301 Allergic rhinitis due to pollen: Secondary | ICD-10-CM | POA: Diagnosis not present

## 2017-05-26 DIAGNOSIS — J301 Allergic rhinitis due to pollen: Secondary | ICD-10-CM | POA: Diagnosis not present

## 2017-06-02 DIAGNOSIS — J301 Allergic rhinitis due to pollen: Secondary | ICD-10-CM | POA: Diagnosis not present

## 2017-06-09 DIAGNOSIS — J301 Allergic rhinitis due to pollen: Secondary | ICD-10-CM | POA: Diagnosis not present

## 2017-06-18 DIAGNOSIS — J301 Allergic rhinitis due to pollen: Secondary | ICD-10-CM | POA: Diagnosis not present

## 2017-06-23 DIAGNOSIS — J301 Allergic rhinitis due to pollen: Secondary | ICD-10-CM | POA: Diagnosis not present

## 2017-06-30 DIAGNOSIS — J301 Allergic rhinitis due to pollen: Secondary | ICD-10-CM | POA: Diagnosis not present

## 2017-07-10 DIAGNOSIS — J301 Allergic rhinitis due to pollen: Secondary | ICD-10-CM | POA: Diagnosis not present

## 2017-07-13 IMAGING — CR DG CHEST 2V
1 series · 2 of 2 positions shown · non-contrast
Comparison: PA and lateral chest x-ray June 26, 2010

CLINICAL DATA: Cough and sneezing chronically no previous cardiac
history former smoker but discontinued in 1470. Who

EXAM:
CHEST  2 VIEW

[Series 1: dg chest 2 view · 0.14mm/px · 2 of 2 slices shown]
[im 1/2]
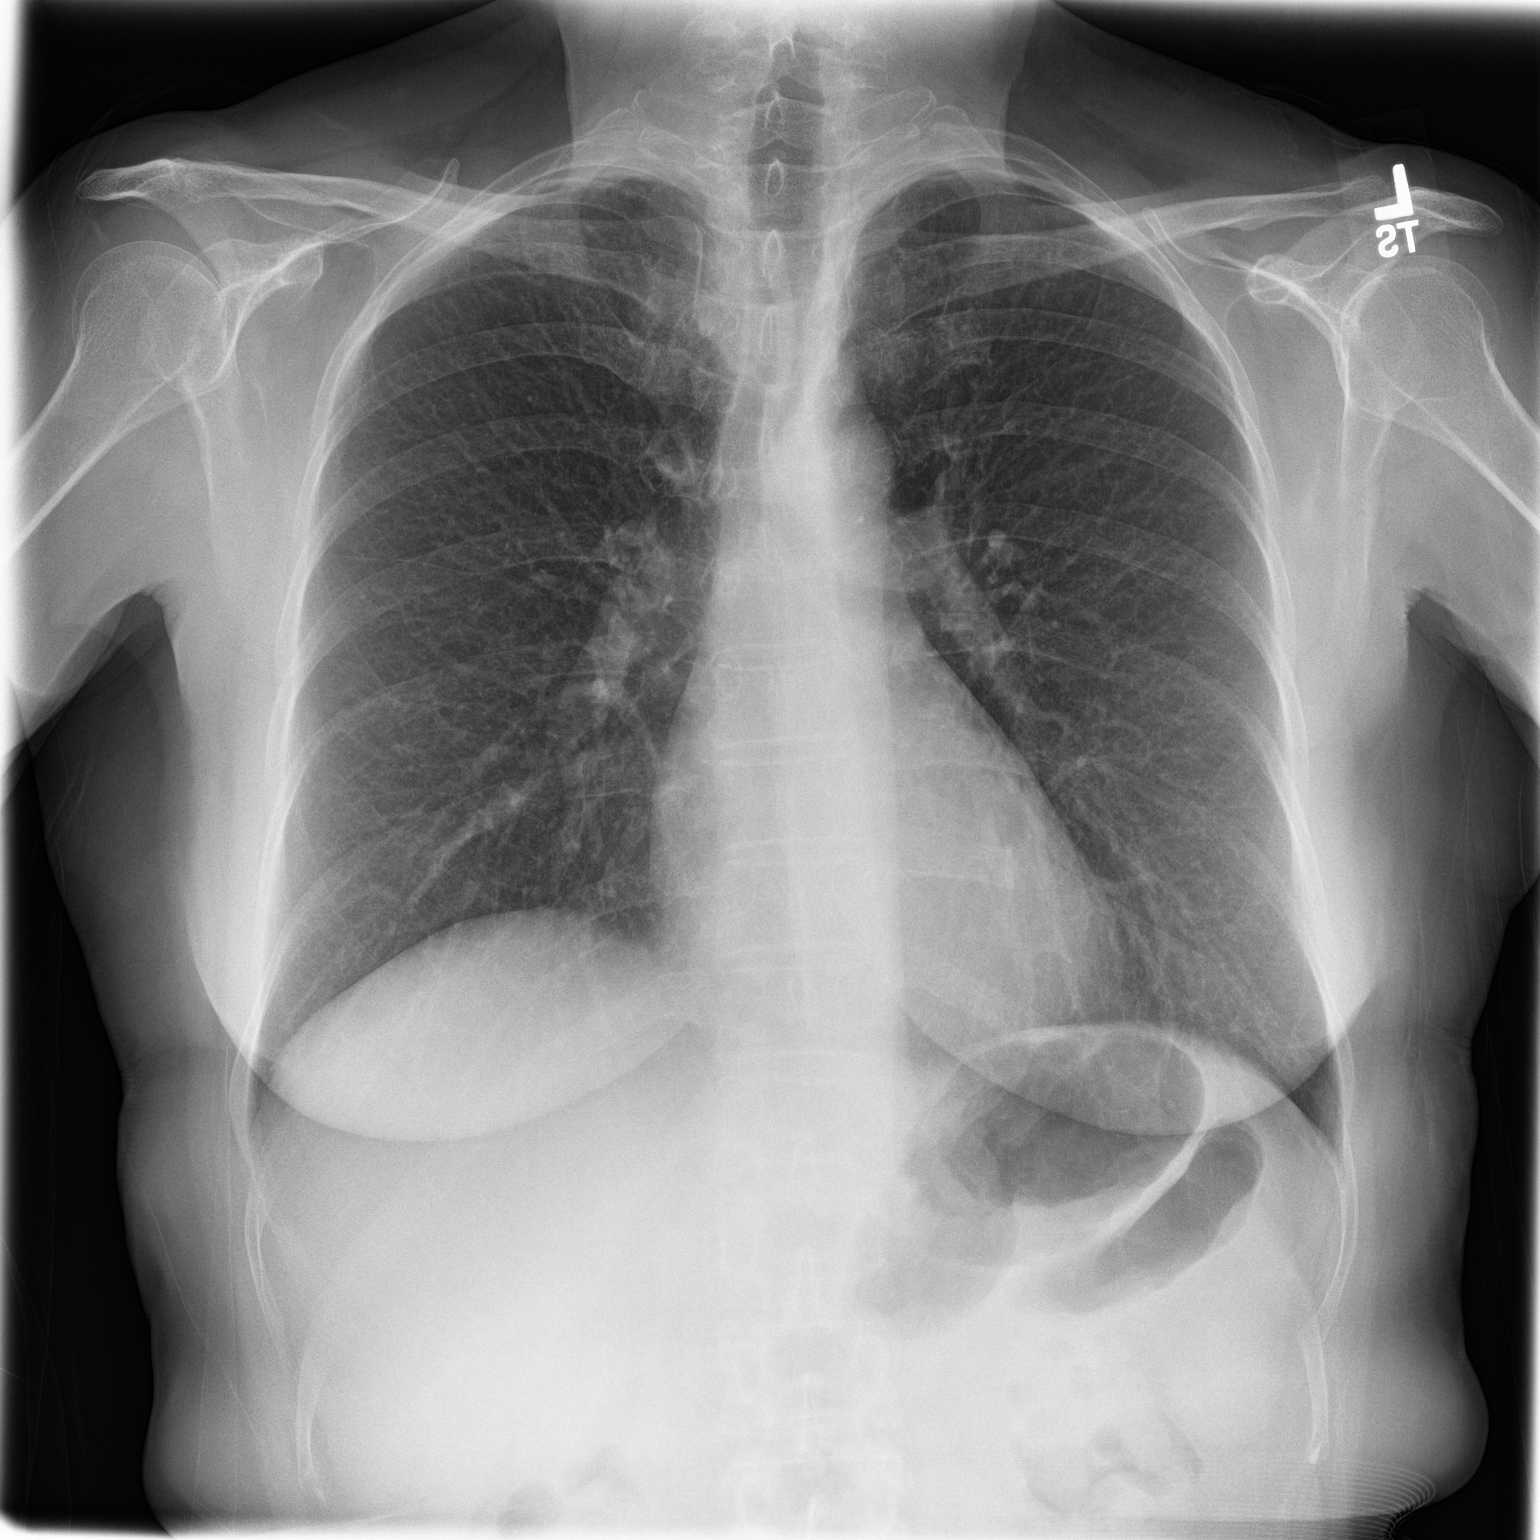
[im 2/2]
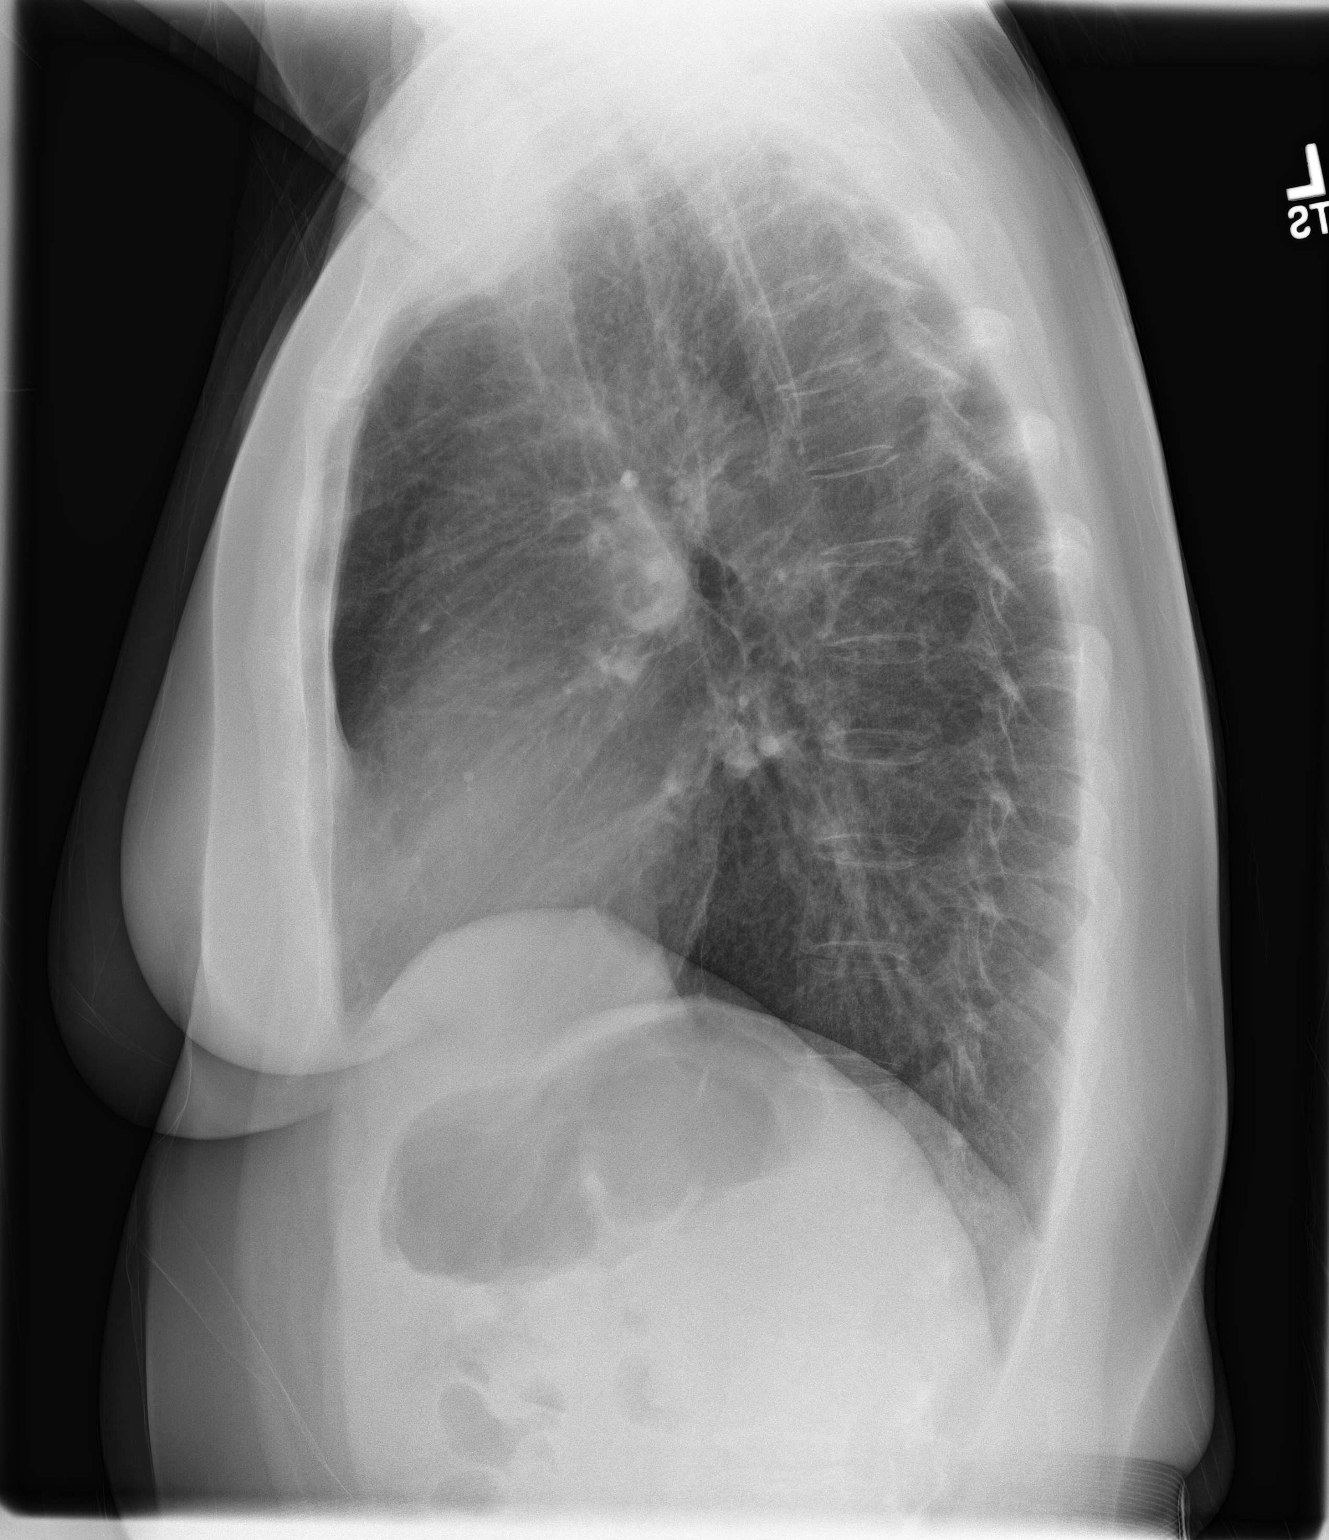

[2 of 2 positions shown; findings below may reference images not displayed]

FINDINGS: The lungs are well-expanded and clear. The heart and pulmonary
vascularity are normal. The mediastinum is normal in width. There is
no pleural effusion. The bony thorax exhibits no acute abnormality.
IMPRESSION: There is no active cardiopulmonary disease.

## 2017-07-14 DIAGNOSIS — J301 Allergic rhinitis due to pollen: Secondary | ICD-10-CM | POA: Diagnosis not present

## 2017-07-15 DIAGNOSIS — J301 Allergic rhinitis due to pollen: Secondary | ICD-10-CM | POA: Diagnosis not present

## 2017-07-15 DIAGNOSIS — H8111 Benign paroxysmal vertigo, right ear: Secondary | ICD-10-CM | POA: Diagnosis not present

## 2017-07-15 DIAGNOSIS — R42 Dizziness and giddiness: Secondary | ICD-10-CM | POA: Diagnosis not present

## 2017-07-21 DIAGNOSIS — J301 Allergic rhinitis due to pollen: Secondary | ICD-10-CM | POA: Diagnosis not present

## 2017-07-23 ENCOUNTER — Ambulatory Visit: Payer: Medicare Other | Admitting: Family Medicine

## 2017-07-23 ENCOUNTER — Encounter: Payer: Self-pay | Admitting: Family Medicine

## 2017-07-23 VITALS — BP 124/62 | HR 74 | Temp 98.3°F | Resp 16 | Wt 148.0 lb

## 2017-07-23 DIAGNOSIS — J069 Acute upper respiratory infection, unspecified: Secondary | ICD-10-CM | POA: Diagnosis not present

## 2017-07-23 DIAGNOSIS — R590 Localized enlarged lymph nodes: Secondary | ICD-10-CM

## 2017-07-23 NOTE — Progress Notes (Signed)
Patient: Rita Webb Female    DOB: 04/14/1946   72 y.o.   MRN: [id number] Visit Date: 07/23/2017  Today's Provider: Lavon Paganini, MD   I, Martha Clan, CMA, am acting as scribe for Lavon Paganini, MD.  Chief Complaint  Patient presents with  . Sinusitis   Subjective:    Sinusitis  Episode onset: December 31st. The problem is unchanged. There has been no fever. Associated symptoms include congestion, coughing (thick, clear sputum), a hoarse voice, sinus pressure, sneezing and swollen glands. Pertinent negatives include no chills, diaphoresis, ear pain (pt is c/o right ear "itching"), headaches, neck pain, shortness of breath or sore throat.  Pt is treating her allergies with Zyrtec, Mucinex nasal spray, and allergy shots. She D/C Montelukast in December because she felt as if she didn't need the medication.  Most concernign thing for the patient is new R sided cervical LAD that she noticed for the first time yesterday morning.  Seems to be more swollen in the morning.  TTP, no fevers, Good PO intake.      Allergies  Allergen Reactions  . Cefuroxime Diarrhea     Current Outpatient Medications:  .  B COMPLEX-C PO, Take 1 tablet by mouth daily. , Disp: , Rfl:  .  Calcium Carbonate-Vitamin D 600-200 MG-UNIT CAPS, Take 2 capsules by mouth daily. , Disp: , Rfl:  .  Cetirizine HCl 10 MG CAPS, Take 1 capsule by mouth daily. , Disp: , Rfl:  .  Cholecalciferol 1000 UNITS capsule, Take 1,000 Units by mouth daily. , Disp: , Rfl:  .  Ibuprofen 200 MG CAPS, Take 1 capsule by mouth as needed. , Disp: , Rfl:  .  MULTIPLE VITAMIN PO, Take 1 tablet by mouth daily. , Disp: , Rfl:  .  Omega-3 Fatty Acids (FISH OIL) 1200 MG CAPS, Take 1 capsule by mouth daily. , Disp: , Rfl:  .  oxymetazoline (MUCINEX NASAL SPRAY FULL FORCE) 0.05 % nasal spray, Place 1 spray into both nostrils 2 (two) times daily., Disp: , Rfl:  .  psyllium (REGULOID) 0.52 G capsule, Take 1 capsule by mouth 6  (six) times daily. , Disp: , Rfl:  .  simvastatin (ZOCOR) 10 MG tablet, TAKE 1 TABLET BY MOUTH AT BEDTIME, Disp: 90 tablet, Rfl: 1 .  UNABLE TO FIND, Med Name: Allergy Shots once a week, Disp: , Rfl:  .  vitamin E 400 UNIT capsule, Take 400 Units by mouth daily., Disp: , Rfl:  .  montelukast (SINGULAIR) 10 MG tablet, Take 10 mg by mouth at bedtime. , Disp: , Rfl:   Review of Systems  Constitutional: Negative for chills and diaphoresis.  HENT: Positive for congestion, hoarse voice, sinus pressure and sneezing. Negative for ear pain (pt is c/o right ear "itching") and sore throat.   Respiratory: Positive for cough (thick, clear sputum). Negative for shortness of breath.   Musculoskeletal: Negative for neck pain.  Neurological: Negative for headaches.    Social History   Tobacco Use  . Smoking status: Former Smoker    Last attempt to quit: 11/10/1968    Years since quitting: 48.7  . Smokeless tobacco: Never Used  Substance Use Topics  . Alcohol use: No   Objective:   BP 124/62 (BP Location: Left Arm, Patient Position: Sitting, Cuff Size: Normal)   Pulse 74   Temp 98.3 F (36.8 C) (Oral)   Resp 16   Wt 148 lb (67.1 kg)   SpO2 97%  BMI 23.89 kg/m  Vitals:   07/23/17 1328  BP: 124/62  Pulse: 74  Resp: 16  Temp: 98.3 F (36.8 C)  TempSrc: Oral  SpO2: 97%  Weight: 148 lb (67.1 kg)     Physical Exam  Constitutional: She appears well-developed and well-nourished. No distress.  HENT:  Head: Normocephalic and atraumatic.  Right Ear: External ear normal.  Left Ear: External ear normal.  Nose: Nose normal. Right sinus exhibits no maxillary sinus tenderness and no frontal sinus tenderness. Left sinus exhibits no maxillary sinus tenderness and no frontal sinus tenderness.  Mouth/Throat: Uvula is midline and mucous membranes are normal. Posterior oropharyngeal erythema present. No oropharyngeal exudate or posterior oropharyngeal edema.  Eyes: Conjunctivae and EOM are normal.  Pupils are equal, round, and reactive to light. No scleral icterus.  Neck: Trachea normal. Neck supple. No thyromegaly present.  Cardiovascular: Normal rate, regular rhythm, normal heart sounds and intact distal pulses.  Pulmonary/Chest: Effort normal and breath sounds normal. No respiratory distress. She has no wheezes. She has no rales.  Lymphadenopathy:       Head (right side): Submandibular adenopathy present. No submental, no preauricular, no posterior auricular and no occipital adenopathy present.       Head (left side): No submental, no submandibular, no preauricular, no posterior auricular and no occipital adenopathy present.  Vitals reviewed.       Assessment & Plan:     1. Cervical lymphadenopathy - likely related to viral URI symptoms - no discreet node palpable but there is a fullness on R neck under mandible with TTP - check CBC - discussed tylenol, warm compress, staying hydrated - discussed watchful waiting - likely benign, but f/u in 2 weeks to recheck - patient does have h/o melanoma, so would consider imaging at f/u if still there - CBC w/Diff/Platelet  2. Viral URI - discussed symptomatic management, natural course, and return precautions      Return in about 2 weeks (around 08/06/2017) for LAD f/u.   The entirety of the information documented in the History of Present Illness, Review of Systems and Physical Exam were personally obtained by me. Portions of this information were initially documented by Raquel Sarna Ratchford, CMA and reviewed by me for thoroughness and accuracy.    Virginia Crews, MD, MPH [facility] 07/23/2017 3:05 PM

## 2017-07-23 NOTE — Patient Instructions (Signed)
Lymphadenopathy Lymphadenopathy refers to swollen or enlarged lymph glands, also called lymph nodes. Lymph glands are part of your body's defense (immune) system, which protects the body from infections, germs, and diseases. Lymph glands are found in many locations in your body, including the neck, underarm, and groin. Many things can cause lymph glands to become enlarged. When your immune system responds to germs, such as viruses or bacteria, infection-fighting cells and fluid build up. This causes the glands to grow in size. Usually, this is not something to worry about. The swelling and any soreness often go away without treatment. However, swollen lymph glands can also be caused by a number of diseases. Your health care provider may do various tests to help determine the cause. If the cause of your swollen lymph glands cannot be found, it is important to monitor your condition to make sure the swelling goes away. Follow these instructions at home: Watch your condition for any changes. The following actions may help to lessen any discomfort you are feeling:  Get plenty of rest.  Take medicines only as directed by your health care provider. Your health care provider may recommend over-the-counter medicines for pain.  Apply moist heat compresses to the site of swollen lymph nodes as directed by your health care provider. This can help reduce any pain.  Check your lymph nodes daily for any changes.  Keep all follow-up visits as directed by your health care provider. This is important.  Contact a health care provider if:  Your lymph nodes are still swollen after 2 weeks.  Your swelling increases or spreads to other areas.  Your lymph nodes are hard, seem fixed to the skin, or are growing rapidly.  Your skin over the lymph nodes is red and inflamed.  You have a fever.  You have chills.  You have fatigue.  You develop a sore throat.  You have abdominal pain.  You have weight  loss.  You have night sweats. Get help right away if:  You notice fluid leaking from the area of the enlarged lymph node.  You have severe pain in any area of your body.  You have chest pain.  You have shortness of breath. This information is not intended to replace advice given to you by your health care provider. Make sure you discuss any questions you have with your health care provider. Document Released: 04/02/2008 Document Revised: 11/30/2015 Document Reviewed: 01/27/2014 Elsevier Interactive Patient Education  2018 Elsevier Inc.  

## 2017-07-24 ENCOUNTER — Telehealth: Payer: Self-pay

## 2017-07-24 LAB — CBC WITH DIFFERENTIAL/PLATELET
BASOS: 0 %
Basophils Absolute: 0 10*3/uL (ref 0.0–0.2)
EOS (ABSOLUTE): 0.1 10*3/uL (ref 0.0–0.4)
EOS: 1 %
HEMATOCRIT: 38.8 % (ref 34.0–46.6)
Hemoglobin: 13.1 g/dL (ref 11.1–15.9)
Immature Grans (Abs): 0 10*3/uL (ref 0.0–0.1)
Immature Granulocytes: 0 %
LYMPHS ABS: 3 10*3/uL (ref 0.7–3.1)
Lymphs: 32 %
MCH: 30.3 pg (ref 26.6–33.0)
MCHC: 33.8 g/dL (ref 31.5–35.7)
MCV: 90 fL (ref 79–97)
MONOS ABS: 0.6 10*3/uL (ref 0.1–0.9)
Monocytes: 7 %
NEUTROS PCT: 60 %
Neutrophils Absolute: 5.6 10*3/uL (ref 1.4–7.0)
PLATELETS: 303 10*3/uL (ref 150–379)
RBC: 4.32 x10E6/uL (ref 3.77–5.28)
RDW: 13.8 % (ref 12.3–15.4)
WBC: 9.3 10*3/uL (ref 3.4–10.8)

## 2017-07-24 NOTE — Telephone Encounter (Signed)
-----   Message from Virginia Crews, MD sent at 07/24/2017 10:09 AM EST ----- Normal blood counts  Brita Romp Dionne Bucy, MD, MPH [facility] 07/24/2017 10:09 AM

## 2017-07-24 NOTE — Telephone Encounter (Signed)
Message advising pt. OK per DPR.

## 2017-07-25 ENCOUNTER — Telehealth: Payer: Self-pay

## 2017-07-25 NOTE — Telephone Encounter (Signed)
Left message advising pt. OK per DPR. 

## 2017-07-25 NOTE — Telephone Encounter (Signed)
Mucinex and flonase could be helpful.  Virginia Crews, MD, MPH [facility] 07/25/2017 11:52 AM

## 2017-07-25 NOTE — Telephone Encounter (Signed)
Pt states she received CBC results, but is asking what she can use to treat the URI sx that caused the adenopathy. She is c/o sinus pain/pressure, nasal congestion, sore throat, coughing and sneezing. She is taking antihistamines, as well as allergy shots. Please advise.

## 2017-07-28 DIAGNOSIS — J301 Allergic rhinitis due to pollen: Secondary | ICD-10-CM | POA: Diagnosis not present

## 2017-08-01 ENCOUNTER — Other Ambulatory Visit: Payer: Self-pay | Admitting: Physician Assistant

## 2017-08-01 DIAGNOSIS — E7800 Pure hypercholesterolemia, unspecified: Secondary | ICD-10-CM

## 2017-08-04 DIAGNOSIS — J301 Allergic rhinitis due to pollen: Secondary | ICD-10-CM | POA: Diagnosis not present

## 2017-08-06 ENCOUNTER — Encounter: Payer: Self-pay | Admitting: Family Medicine

## 2017-08-06 ENCOUNTER — Ambulatory Visit: Payer: Medicare Other | Admitting: Family Medicine

## 2017-08-06 VITALS — BP 140/68 | HR 77 | Temp 97.9°F | Resp 16 | Wt 150.0 lb

## 2017-08-06 DIAGNOSIS — R590 Localized enlarged lymph nodes: Secondary | ICD-10-CM

## 2017-08-06 DIAGNOSIS — J309 Allergic rhinitis, unspecified: Secondary | ICD-10-CM

## 2017-08-06 NOTE — Progress Notes (Signed)
Patient: Rita Webb Female    DOB: 09-05-1945   72 y.o.   MRN: [id number] Visit Date: 08/06/2017  Today's Provider: Lavon Paganini, MD   I, Martha Clan, CMA, am acting as scribe for Lavon Paganini, MD.  Chief Complaint  Patient presents with  . Lymphadenopathy   Subjective:    HPI     Follow up for Cervical Lymphadenopathy  The patient was last seen for this 2 weeks ago. Changes made at last visit include ordering CBC (WNL) and advising pt to treat URI sx with Flonase and Mucinex.  She reports good compliance with treatment. She feels that condition is Improved. Her lymphadenopathy is 100% improved, her URI is improved but she is still experiencing cough that she believes is secondary to PND. She heard of a supplement, N-Acetyl Cystein, that is meant to help her allergy sx. She would like to know if this is recommended. She is not having side effects.   Lab Results  Component Value Date   WBC 9.3 07/23/2017   HGB 13.1 07/23/2017   HCT 38.8 07/23/2017   MCV 90 07/23/2017   PLT 303 07/23/2017   ------------------------------------------------------------------------------------    Allergies  Allergen Reactions  . Cefuroxime Diarrhea     Current Outpatient Medications:  .  B COMPLEX-C PO, Take 1 tablet by mouth daily. , Disp: , Rfl:  .  Calcium Carbonate-Vitamin D 600-200 MG-UNIT CAPS, Take 2 capsules by mouth daily. , Disp: , Rfl:  .  Cetirizine HCl 10 MG CAPS, Take 1 capsule by mouth daily. , Disp: , Rfl:  .  Cholecalciferol 1000 UNITS capsule, Take 1,000 Units by mouth daily. , Disp: , Rfl:  .  Ibuprofen 200 MG CAPS, Take 1 capsule by mouth as needed. , Disp: , Rfl:  .  montelukast (SINGULAIR) 10 MG tablet, Take 10 mg by mouth at bedtime. , Disp: , Rfl:  .  MULTIPLE VITAMIN PO, Take 1 tablet by mouth daily. , Disp: , Rfl:  .  Omega-3 Fatty Acids (FISH OIL) 1200 MG CAPS, Take 1 capsule by mouth daily. , Disp: , Rfl:  .  oxymetazoline  (MUCINEX NASAL SPRAY FULL FORCE) 0.05 % nasal spray, Place 1 spray into both nostrils 2 (two) times daily., Disp: , Rfl:  .  psyllium (REGULOID) 0.52 G capsule, Take 1 capsule by mouth 6 (six) times daily. , Disp: , Rfl:  .  simvastatin (ZOCOR) 10 MG tablet, TAKE 1 TABLET BY MOUTH DAILY AT BEDTIME, Disp: 90 tablet, Rfl: 1 .  UNABLE TO FIND, Med Name: Allergy Shots once a week, Disp: , Rfl:  .  vitamin E 400 UNIT capsule, Take 400 Units by mouth daily., Disp: , Rfl:   Review of Systems  Constitutional: Positive for diaphoresis (hot flashes). Negative for activity change, appetite change, chills, fatigue, fever and unexpected weight change.  HENT: Positive for postnasal drip, rhinorrhea and sneezing.   Respiratory: Positive for cough. Negative for shortness of breath.   Cardiovascular: Negative for chest pain, palpitations and leg swelling.  Hematological: Negative for adenopathy.    Social History   Tobacco Use  . Smoking status: Former Smoker    Last attempt to quit: 11/10/1968    Years since quitting: 48.7  . Smokeless tobacco: Never Used  Substance Use Topics  . Alcohol use: No   Objective:   BP 140/68 (BP Location: Left Arm, Patient Position: Sitting, Cuff Size: Normal)   Pulse 77   Temp 97.9 F (36.6  C) (Oral)   Resp 16   Wt 150 lb (68 kg)   SpO2 96%   BMI 24.21 kg/m  Vitals:   08/06/17 1331  BP: 140/68  Pulse: 77  Resp: 16  Temp: 97.9 F (36.6 C)  TempSrc: Oral  SpO2: 96%  Weight: 150 lb (68 kg)     Physical Exam  Constitutional: She appears well-developed and well-nourished. No distress.  HENT:  Head: Normocephalic and atraumatic.  Right Ear: External ear normal.  Left Ear: External ear normal.  Nose: Nose normal.  Mouth/Throat: Uvula is midline and mucous membranes are normal. Posterior oropharyngeal erythema (mild) present. No oropharyngeal exudate.  Eyes: Conjunctivae and EOM are normal. Pupils are equal, round, and reactive to light. No scleral icterus.    Neck: Neck supple. No thyromegaly present.  Cardiovascular: Normal rate, regular rhythm, normal heart sounds and intact distal pulses.  No murmur heard. Pulmonary/Chest: Effort normal and breath sounds normal. No respiratory distress. She has no wheezes. She has no rales.  Lymphadenopathy:    She has no cervical adenopathy.  Neurological: She is alert.  Skin: Skin is warm and dry. No rash noted.  Psychiatric: She has a normal mood and affect. Her behavior is normal.  Vitals reviewed.       Assessment & Plan:         1. Cervical lymphadenopathy - resolved - likely reactive 2/2 URI that has now resolved - recent CBC wnl  2. Allergic rhinitis, unspecified seasonality, unspecified trigger - chronic and uncontrolled with continued PND - followed by Allergist who has her on max therapy with antihistamine, singulair, and allergy shots - discussed that N acetylcysteine can be used as mucolytic agent in COPD so may have some benefit in allergic rhinitis, but I do not know of any data about this   Return if symptoms worsen or fail to improve.   The entirety of the information documented in the History of Present Illness, Review of Systems and Physical Exam were personally obtained by me. Portions of this information were initially documented by Raquel Sarna Ratchford, CMA and reviewed by me for thoroughness and accuracy.    Virginia Crews, MD, MPH [facility] 08/06/2017 2:00 PM

## 2017-08-11 DIAGNOSIS — J301 Allergic rhinitis due to pollen: Secondary | ICD-10-CM | POA: Diagnosis not present

## 2017-08-12 ENCOUNTER — Other Ambulatory Visit: Payer: Self-pay | Admitting: Physician Assistant

## 2017-08-12 DIAGNOSIS — Z1231 Encounter for screening mammogram for malignant neoplasm of breast: Secondary | ICD-10-CM

## 2017-08-18 DIAGNOSIS — J301 Allergic rhinitis due to pollen: Secondary | ICD-10-CM | POA: Diagnosis not present

## 2017-08-25 DIAGNOSIS — J301 Allergic rhinitis due to pollen: Secondary | ICD-10-CM | POA: Diagnosis not present

## 2017-09-01 DIAGNOSIS — J301 Allergic rhinitis due to pollen: Secondary | ICD-10-CM | POA: Diagnosis not present

## 2017-09-08 DIAGNOSIS — J301 Allergic rhinitis due to pollen: Secondary | ICD-10-CM | POA: Diagnosis not present

## 2017-09-15 DIAGNOSIS — J301 Allergic rhinitis due to pollen: Secondary | ICD-10-CM | POA: Diagnosis not present

## 2017-09-22 DIAGNOSIS — J301 Allergic rhinitis due to pollen: Secondary | ICD-10-CM | POA: Diagnosis not present

## 2017-09-23 ENCOUNTER — Telehealth: Payer: Self-pay

## 2017-09-23 ENCOUNTER — Ambulatory Visit
Admission: RE | Admit: 2017-09-23 | Discharge: 2017-09-23 | Disposition: A | Discharge status: Home / Self Care | Payer: Medicare Other | Source: Ambulatory Visit | Attending: Physician Assistant | Admitting: Physician Assistant

## 2017-09-23 DIAGNOSIS — Z1231 Encounter for screening mammogram for malignant neoplasm of breast: Secondary | ICD-10-CM | POA: Diagnosis not present

## 2017-09-23 NOTE — Telephone Encounter (Signed)
Patient advised as below.  

## 2017-09-23 NOTE — Telephone Encounter (Signed)
-----   Message from Mar Daring, PA-C sent at 09/23/2017  4:37 PM EDT ----- Normal mammogram. Repeat screening in one year.

## 2017-09-29 DIAGNOSIS — J301 Allergic rhinitis due to pollen: Secondary | ICD-10-CM | POA: Diagnosis not present

## 2017-10-06 DIAGNOSIS — J301 Allergic rhinitis due to pollen: Secondary | ICD-10-CM | POA: Diagnosis not present

## 2017-10-10 DIAGNOSIS — J301 Allergic rhinitis due to pollen: Secondary | ICD-10-CM | POA: Diagnosis not present

## 2017-10-13 DIAGNOSIS — J301 Allergic rhinitis due to pollen: Secondary | ICD-10-CM | POA: Diagnosis not present

## 2017-10-20 DIAGNOSIS — J301 Allergic rhinitis due to pollen: Secondary | ICD-10-CM | POA: Diagnosis not present

## 2017-10-27 ENCOUNTER — Ambulatory Visit: Payer: Medicare Other | Admitting: Family Medicine

## 2017-10-27 ENCOUNTER — Encounter: Payer: Self-pay | Admitting: Family Medicine

## 2017-10-27 VITALS — BP 150/76 | HR 84 | Temp 98.4°F | Resp 16 | Wt 149.0 lb

## 2017-10-27 DIAGNOSIS — J069 Acute upper respiratory infection, unspecified: Secondary | ICD-10-CM | POA: Diagnosis not present

## 2017-10-27 DIAGNOSIS — B9789 Other viral agents as the cause of diseases classified elsewhere: Secondary | ICD-10-CM

## 2017-10-27 NOTE — Patient Instructions (Signed)
Upper Respiratory Infection, Adult Most upper respiratory infections (URIs) are caused by a virus. A URI affects the nose, throat, and upper air passages. The most common type of URI is often called "the common cold." Follow these instructions at home:  Take medicines only as told by your doctor.  Gargle warm saltwater or take cough drops to comfort your throat as told by your doctor.  Use a warm mist humidifier or inhale steam from a shower to increase air moisture. This may make it easier to breathe.  Drink enough fluid to keep your pee (urine) clear or pale yellow.  Eat soups and other clear broths.  Have a healthy diet.  Rest as needed.  Go back to work when your fever is gone or your doctor says it is okay. ? You may need to stay home longer to avoid giving your URI to others. ? You can also wear a face mask and wash your hands often to prevent spread of the virus.  Use your inhaler more if you have asthma.  Do not use any tobacco products, including cigarettes, chewing tobacco, or electronic cigarettes. If you need help quitting, ask your doctor. Contact a doctor if:  You are getting worse, not better.  Your symptoms are not helped by medicine.  You have chills.  You are getting more short of breath.  You have brown or red mucus.  You have yellow or brown discharge from your nose.  You have pain in your face, especially when you bend forward.  You have a fever.  You have puffy (swollen) neck glands.  You have pain while swallowing.  You have white areas in the back of your throat. Get help right away if:  You have very bad or constant: ? Headache. ? Ear pain. ? Pain in your forehead, behind your eyes, and over your cheekbones (sinus pain). ? Chest pain.  You have long-lasting (chronic) lung disease and any of the following: ? Wheezing. ? Long-lasting cough. ? Coughing up blood. ? A change in your usual mucus.  You have a stiff neck.  You have  changes in your: ? Vision. ? Hearing. ? Thinking. ? Mood. This information is not intended to replace advice given to you by your health care provider. Make sure you discuss any questions you have with your health care provider. Document Released: 12/11/2007 Document Revised: 02/25/2016 Document Reviewed: 09/29/2013 Elsevier Interactive Patient Education  2018 Elsevier Inc.  

## 2017-10-27 NOTE — Progress Notes (Signed)
Patient: Rita Webb Female    DOB: [birth year]   72 y.o.   MRN: [id number] Visit Date: 10/27/2017  Today's Provider: Lavon Paganini, MD   I, Martha Clan, CMA, am acting as scribe for Lavon Paganini, MD.  Chief Complaint  Patient presents with  . URI   Subjective:    URI   This is a new problem. Episode onset: x 5 days. Maximum temperature: highest documented temperature was 99.7. Associated symptoms include congestion, coughing (productive of green sputum), headaches, neck pain, a plugged ear sensation (and itching), rhinorrhea, sinus pain, sneezing and wheezing (this morning). Pertinent negatives include no abdominal pain, chest pain, ear pain, nausea, sore throat, swollen glands or vomiting. Treatments tried: DayQuil, NyQuil, and IBU. The treatment provided no relief.      Allergies  Allergen Reactions  . Cefuroxime Diarrhea     Current Outpatient Medications:  .  B COMPLEX-C PO, Take 1 tablet by mouth daily. , Disp: , Rfl:  .  Calcium Carbonate-Vitamin D 600-200 MG-UNIT CAPS, Take 2 capsules by mouth daily. , Disp: , Rfl:  .  Cetirizine HCl 10 MG CAPS, Take 1 capsule by mouth daily. , Disp: , Rfl:  .  Cholecalciferol 1000 UNITS capsule, Take 1,000 Units by mouth daily. , Disp: , Rfl:  .  Ibuprofen 200 MG CAPS, Take 1 capsule by mouth as needed. , Disp: , Rfl:  .  montelukast (SINGULAIR) 10 MG tablet, Take 10 mg by mouth at bedtime. , Disp: , Rfl:  .  MULTIPLE VITAMIN PO, Take 1 tablet by mouth daily. , Disp: , Rfl:  .  Omega-3 Fatty Acids (FISH OIL) 1200 MG CAPS, Take 1 capsule by mouth daily. , Disp: , Rfl:  .  oxymetazoline (MUCINEX NASAL SPRAY FULL FORCE) 0.05 % nasal spray, Place 1 spray into both nostrils 2 (two) times daily., Disp: , Rfl:  .  psyllium (REGULOID) 0.52 G capsule, Take 1 capsule by mouth 6 (six) times daily. , Disp: , Rfl:  .  simvastatin (ZOCOR) 10 MG tablet, TAKE 1 TABLET BY MOUTH DAILY AT BEDTIME, Disp: 90 tablet, Rfl: 1 .  UNABLE TO  FIND, Med Name: Allergy Shots once a week, Disp: , Rfl:  .  vitamin E 400 UNIT capsule, Take 400 Units by mouth daily., Disp: , Rfl:   Review of Systems  HENT: Positive for congestion, rhinorrhea, sinus pain and sneezing. Negative for ear pain and sore throat.   Respiratory: Positive for cough (productive of green sputum) and wheezing (this morning).   Cardiovascular: Negative for chest pain.  Gastrointestinal: Negative for abdominal pain, nausea and vomiting.  Musculoskeletal: Positive for neck pain.  Neurological: Positive for headaches.    Social History   Tobacco Use  . Smoking status: Former Smoker    Last attempt to quit: 11/10/1968    Years since quitting: 48.9  . Smokeless tobacco: Never Used  Substance Use Topics  . Alcohol use: No   Objective:   BP (!) 150/76 (BP Location: Left Arm, Patient Position: Sitting, Cuff Size: Normal)   Pulse 84   Temp 98.4 F (36.9 C) (Oral)   Resp 16   Wt 149 lb (67.6 kg)   SpO2 94%   BMI 24.05 kg/m  Vitals:   10/27/17 1027  BP: (!) 150/76  Pulse: 84  Resp: 16  Temp: 98.4 F (36.9 C)  TempSrc: Oral  SpO2: 94%  Weight: 149 lb (67.6 kg)     Physical Exam  Constitutional:  She is oriented to person, place, and time. She appears well-developed and well-nourished. No distress.  HENT:  Head: Normocephalic and atraumatic.  Right Ear: Tympanic membrane, external ear and ear canal normal.  Left Ear: Tympanic membrane, external ear and ear canal normal.  Nose: Mucosal edema present. Right sinus exhibits no maxillary sinus tenderness and no frontal sinus tenderness. Left sinus exhibits no maxillary sinus tenderness and no frontal sinus tenderness.  Mouth/Throat: Uvula is midline and mucous membranes are normal. Posterior oropharyngeal erythema present. No oropharyngeal exudate or posterior oropharyngeal edema.  Eyes: Pupils are equal, round, and reactive to light. Conjunctivae are normal. Right eye exhibits no discharge. Left eye exhibits  no discharge. No scleral icterus.  Neck: Neck supple. No thyromegaly present.  Cardiovascular: Normal rate, regular rhythm, normal heart sounds and intact distal pulses.  No murmur heard. Pulmonary/Chest: Effort normal and breath sounds normal. No respiratory distress. She has no wheezes. She has no rales.  Musculoskeletal: She exhibits no edema.  Lymphadenopathy:    She has no cervical adenopathy.  Neurological: She is alert and oriented to person, place, and time.  Skin: Skin is warm and dry. Capillary refill takes less than 2 seconds. No rash noted.  Psychiatric: She has a normal mood and affect. Her behavior is normal.  Vitals reviewed.     Assessment & Plan:  1. Viral URI with cough - symptoms and exam c/w viral URI - no evidence of strep pharyngitis, CAP, AOM, bacterial sinusitis, or other bacterial infection - discussed symptomatic management, natural course, and return precautions   Return if symptoms worsen or fail to improve.   The entirety of the information documented in the History of Present Illness, Review of Systems and Physical Exam were personally obtained by me. Portions of this information were initially documented by Raquel Sarna Ratchford, CMA and reviewed by me for thoroughness and accuracy.    Virginia Crews, MD, MPH [facility] 10/27/2017 11:13 AM

## 2017-11-03 DIAGNOSIS — J301 Allergic rhinitis due to pollen: Secondary | ICD-10-CM | POA: Diagnosis not present

## 2017-11-04 ENCOUNTER — Telehealth: Payer: Self-pay | Admitting: Family Medicine

## 2017-11-04 NOTE — Telephone Encounter (Signed)
Pt stated she had OV with Dr. Jacinto Reap on 10/27/17 and she still has the cough and congestion. Pt stated she has been taking OTC medications as directed. Pt is requesting a call back to see if there is something else she can try. [employer]. Please advise. Thanks TNP

## 2017-11-05 DIAGNOSIS — D2261 Melanocytic nevi of right upper limb, including shoulder: Secondary | ICD-10-CM | POA: Diagnosis not present

## 2017-11-05 DIAGNOSIS — D225 Melanocytic nevi of trunk: Secondary | ICD-10-CM | POA: Diagnosis not present

## 2017-11-05 DIAGNOSIS — D2262 Melanocytic nevi of left upper limb, including shoulder: Secondary | ICD-10-CM | POA: Diagnosis not present

## 2017-11-05 DIAGNOSIS — L281 Prurigo nodularis: Secondary | ICD-10-CM | POA: Diagnosis not present

## 2017-11-05 MED ORDER — AMOXICILLIN-POT CLAVULANATE 875-125 MG PO TABS: 1.0000 | ORAL_TABLET | Freq: Two times a day (BID) | ORAL | 0 refills | Status: DC

## 2017-11-05 NOTE — Telephone Encounter (Signed)
Patient can try a 7-day course of Augmentin for possible sinusitis.  If she is not improved, or worsens, she should have a follow-up visit to reassess.  Virginia Crews, MD, MPH [facility] 11/05/2017 11:40 AM

## 2017-11-05 NOTE — Telephone Encounter (Signed)
Left message advising pt. OK per DPR. 

## 2017-11-13 DIAGNOSIS — J301 Allergic rhinitis due to pollen: Secondary | ICD-10-CM | POA: Diagnosis not present

## 2017-11-19 DIAGNOSIS — J301 Allergic rhinitis due to pollen: Secondary | ICD-10-CM | POA: Diagnosis not present

## 2017-11-24 DIAGNOSIS — J301 Allergic rhinitis due to pollen: Secondary | ICD-10-CM | POA: Diagnosis not present

## 2017-12-03 DIAGNOSIS — J301 Allergic rhinitis due to pollen: Secondary | ICD-10-CM | POA: Diagnosis not present

## 2017-12-08 DIAGNOSIS — J301 Allergic rhinitis due to pollen: Secondary | ICD-10-CM | POA: Diagnosis not present

## 2017-12-15 DIAGNOSIS — J301 Allergic rhinitis due to pollen: Secondary | ICD-10-CM | POA: Diagnosis not present

## 2017-12-22 DIAGNOSIS — J301 Allergic rhinitis due to pollen: Secondary | ICD-10-CM | POA: Diagnosis not present

## 2017-12-29 DIAGNOSIS — J301 Allergic rhinitis due to pollen: Secondary | ICD-10-CM | POA: Diagnosis not present

## 2017-12-31 ENCOUNTER — Encounter: Payer: Self-pay | Admitting: Family Medicine

## 2018-01-01 DIAGNOSIS — Z0000 Encounter for general adult medical examination without abnormal findings: Secondary | ICD-10-CM | POA: Diagnosis not present

## 2018-01-02 DIAGNOSIS — J301 Allergic rhinitis due to pollen: Secondary | ICD-10-CM | POA: Diagnosis not present

## 2018-01-05 DIAGNOSIS — J301 Allergic rhinitis due to pollen: Secondary | ICD-10-CM | POA: Diagnosis not present

## 2018-01-05 LAB — FECAL OCCULT BLOOD, GUAIAC: FECAL OCCULT BLD: NEGATIVE

## 2018-01-12 DIAGNOSIS — J301 Allergic rhinitis due to pollen: Secondary | ICD-10-CM | POA: Diagnosis not present

## 2018-01-19 DIAGNOSIS — J301 Allergic rhinitis due to pollen: Secondary | ICD-10-CM | POA: Diagnosis not present

## 2018-01-26 DIAGNOSIS — J301 Allergic rhinitis due to pollen: Secondary | ICD-10-CM | POA: Diagnosis not present

## 2018-02-02 DIAGNOSIS — J301 Allergic rhinitis due to pollen: Secondary | ICD-10-CM | POA: Diagnosis not present

## 2018-02-03 ENCOUNTER — Other Ambulatory Visit: Payer: Self-pay | Admitting: Physician Assistant

## 2018-02-03 DIAGNOSIS — E7800 Pure hypercholesterolemia, unspecified: Secondary | ICD-10-CM

## 2018-02-09 DIAGNOSIS — J301 Allergic rhinitis due to pollen: Secondary | ICD-10-CM | POA: Diagnosis not present

## 2018-02-16 ENCOUNTER — Encounter: Payer: Self-pay | Admitting: Physician Assistant

## 2018-02-16 ENCOUNTER — Ambulatory Visit (INDEPENDENT_AMBULATORY_CARE_PROVIDER_SITE_OTHER): Payer: Medicare Other | Admitting: Physician Assistant

## 2018-02-16 VITALS — BP 130/70 | HR 76 | Temp 98.3°F | Resp 16 | Ht 66.0 in | Wt 146.8 lb

## 2018-02-16 DIAGNOSIS — Z803 Family history of malignant neoplasm of breast: Secondary | ICD-10-CM

## 2018-02-16 DIAGNOSIS — M810 Age-related osteoporosis without current pathological fracture: Secondary | ICD-10-CM

## 2018-02-16 DIAGNOSIS — Z1231 Encounter for screening mammogram for malignant neoplasm of breast: Secondary | ICD-10-CM

## 2018-02-16 DIAGNOSIS — J301 Allergic rhinitis due to pollen: Secondary | ICD-10-CM | POA: Diagnosis not present

## 2018-02-16 DIAGNOSIS — E7800 Pure hypercholesterolemia, unspecified: Secondary | ICD-10-CM | POA: Diagnosis not present

## 2018-02-16 DIAGNOSIS — E559 Vitamin D deficiency, unspecified: Secondary | ICD-10-CM | POA: Diagnosis not present

## 2018-02-16 DIAGNOSIS — Z1239 Encounter for other screening for malignant neoplasm of breast: Secondary | ICD-10-CM

## 2018-02-16 DIAGNOSIS — Z1159 Encounter for screening for other viral diseases: Secondary | ICD-10-CM | POA: Diagnosis not present

## 2018-02-16 DIAGNOSIS — Z0000 Encounter for general adult medical examination without abnormal findings: Secondary | ICD-10-CM

## 2018-02-16 NOTE — Patient Instructions (Signed)
Health Maintenance for Postmenopausal Women Menopause is a normal process in which your reproductive ability comes to an end. This process happens gradually over a span of months to years, usually between the ages of 22 and 9. Menopause is complete when you have missed 12 consecutive menstrual periods. It is important to talk with your health care provider about some of the most common conditions that affect postmenopausal women, such as heart disease, cancer, and bone loss (osteoporosis). Adopting a healthy lifestyle and getting preventive care can help to promote your health and wellness. Those actions can also lower your chances of developing some of these common conditions. What should I know about menopause? During menopause, you may experience a number of symptoms, such as:  Moderate-to-severe hot flashes.  Night sweats.  Decrease in sex drive.  Mood swings.  Headaches.  Tiredness.  Irritability.  Memory problems.  Insomnia.  Choosing to treat or not to treat menopausal changes is an individual decision that you make with your health care provider. What should I know about hormone replacement therapy and supplements? Hormone therapy products are effective for treating symptoms that are associated with menopause, such as hot flashes and night sweats. Hormone replacement carries certain risks, especially as you become older. If you are thinking about using estrogen or estrogen with progestin treatments, discuss the benefits and risks with your health care provider. What should I know about heart disease and stroke? Heart disease, heart attack, and stroke become more likely as you age. This may be due, in part, to the hormonal changes that your body experiences during menopause. These can affect how your body processes dietary fats, triglycerides, and cholesterol. Heart attack and stroke are both medical emergencies. There are many things that you can do to help prevent heart disease  and stroke:  Have your blood pressure checked at least every 1-2 years. High blood pressure causes heart disease and increases the risk of stroke.  If you are 53-22 years old, ask your health care provider if you should take aspirin to prevent a heart attack or a stroke.  Do not use any tobacco products, including cigarettes, chewing tobacco, or electronic cigarettes. If you need help quitting, ask your health care provider.  It is important to eat a healthy diet and maintain a healthy weight. ? Be sure to include plenty of vegetables, fruits, low-fat dairy products, and lean protein. ? Avoid eating foods that are high in solid fats, added sugars, or salt (sodium).  Get regular exercise. This is one of the most important things that you can do for your health. ? Try to exercise for at least 150 minutes each week. The type of exercise that you do should increase your heart rate and make you sweat. This is known as moderate-intensity exercise. ? Try to do strengthening exercises at least twice each week. Do these in addition to the moderate-intensity exercise.  Know your numbers.Ask your health care provider to check your cholesterol and your blood glucose. Continue to have your blood tested as directed by your health care provider.  What should I know about cancer screening? There are several types of cancer. Take the following steps to reduce your risk and to catch any cancer development as early as possible. Breast Cancer  Practice breast self-awareness. ? This means understanding how your breasts normally appear and feel. ? It also means doing regular breast self-exams. Let your health care provider know about any changes, no matter how small.  If you are 40  or older, have a clinician do a breast exam (clinical breast exam or CBE) every year. Depending on your age, family history, and medical history, it may be recommended that you also have a yearly breast X-ray (mammogram).  If you  have a family history of breast cancer, talk with your health care provider about genetic screening.  If you are at high risk for breast cancer, talk with your health care provider about having an MRI and a mammogram every year.  Breast cancer (BRCA) gene test is recommended for women who have family members with BRCA-related cancers. Results of the assessment will determine the need for genetic counseling and BRCA1 and for BRCA2 testing. BRCA-related cancers include these types: ? Breast. This occurs in males or females. ? Ovarian. ? Tubal. This may also be called fallopian tube cancer. ? Cancer of the abdominal or pelvic lining (peritoneal cancer). ? Prostate. ? Pancreatic.  Cervical, Uterine, and Ovarian Cancer Your health care provider may recommend that you be screened regularly for cancer of the pelvic organs. These include your ovaries, uterus, and vagina. This screening involves a pelvic exam, which includes checking for microscopic changes to the surface of your cervix (Pap test).  For women ages 21-65, health care providers may recommend a pelvic exam and a Pap test every three years. For women ages 79-65, they may recommend the Pap test and pelvic exam, combined with testing for human papilloma virus (HPV), every five years. Some types of HPV increase your risk of cervical cancer. Testing for HPV may also be done on women of any age who have unclear Pap test results.  Other health care providers may not recommend any screening for nonpregnant women who are considered low risk for pelvic cancer and have no symptoms. Ask your health care provider if a screening pelvic exam is right for you.  If you have had past treatment for cervical cancer or a condition that could lead to cancer, you need Pap tests and screening for cancer for at least 20 years after your treatment. If Pap tests have been discontinued for you, your risk factors (such as having a new sexual partner) need to be  reassessed to determine if you should start having screenings again. Some women have medical problems that increase the chance of getting cervical cancer. In these cases, your health care provider may recommend that you have screening and Pap tests more often.  If you have a family history of uterine cancer or ovarian cancer, talk with your health care provider about genetic screening.  If you have vaginal bleeding after reaching menopause, tell your health care provider.  There are currently no reliable tests available to screen for ovarian cancer.  Lung Cancer Lung cancer screening is recommended for adults 69-62 years old who are at high risk for lung cancer because of a history of smoking. A yearly low-dose CT scan of the lungs is recommended if you:  Currently smoke.  Have a history of at least 30 pack-years of smoking and you currently smoke or have quit within the past 15 years. A pack-year is smoking an average of one pack of cigarettes per day for one year.  Yearly screening should:  Continue until it has been 15 years since you quit.  Stop if you develop a health problem that would prevent you from having lung cancer treatment.  Colorectal Cancer  This type of cancer can be detected and can often be prevented.  Routine colorectal cancer screening usually begins at  age 42 and continues through age 45.  If you have risk factors for colon cancer, your health care provider may recommend that you be screened at an earlier age.  If you have a family history of colorectal cancer, talk with your health care provider about genetic screening.  Your health care provider may also recommend using home test kits to check for hidden blood in your stool.  A small camera at the end of a tube can be used to examine your colon directly (sigmoidoscopy or colonoscopy). This is done to check for the earliest forms of colorectal cancer.  Direct examination of the colon should be repeated every  5-10 years until age 71. However, if early forms of precancerous polyps or small growths are found or if you have a family history or genetic risk for colorectal cancer, you may need to be screened more often.  Skin Cancer  Check your skin from head to toe regularly.  Monitor any moles. Be sure to tell your health care provider: ? About any new moles or changes in moles, especially if there is a change in a mole's shape or color. ? If you have a mole that is larger than the size of a pencil eraser.  If any of your family members has a history of skin cancer, especially at a young age, talk with your health care provider about genetic screening.  Always use sunscreen. Apply sunscreen liberally and repeatedly throughout the day.  Whenever you are outside, protect yourself by wearing long sleeves, pants, a wide-brimmed hat, and sunglasses.  What should I know about osteoporosis? Osteoporosis is a condition in which bone destruction happens more quickly than new bone creation. After menopause, you may be at an increased risk for osteoporosis. To help prevent osteoporosis or the bone fractures that can happen because of osteoporosis, the following is recommended:  If you are 46-71 years old, get at least 1,000 mg of calcium and at least 600 mg of vitamin D per day.  If you are older than age 55 but younger than age 65, get at least 1,200 mg of calcium and at least 600 mg of vitamin D per day.  If you are older than age 54, get at least 1,200 mg of calcium and at least 800 mg of vitamin D per day.  Smoking and excessive alcohol intake increase the risk of osteoporosis. Eat foods that are rich in calcium and vitamin D, and do weight-bearing exercises several times each week as directed by your health care provider. What should I know about how menopause affects my mental health? Depression may occur at any age, but it is more common as you become older. Common symptoms of depression  include:  Low or sad mood.  Changes in sleep patterns.  Changes in appetite or eating patterns.  Feeling an overall lack of motivation or enjoyment of activities that you previously enjoyed.  Frequent crying spells.  Talk with your health care provider if you think that you are experiencing depression. What should I know about immunizations? It is important that you get and maintain your immunizations. These include:  Tetanus, diphtheria, and pertussis (Tdap) booster vaccine.  Influenza every year before the flu season begins.  Pneumonia vaccine.  Shingles vaccine.  Your health care provider may also recommend other immunizations. This information is not intended to replace advice given to you by your health care provider. Make sure you discuss any questions you have with your health care provider. Document Released: 08/16/2005  Document Revised: 01/12/2016 Document Reviewed: 03/28/2015 Elsevier Interactive Patient Education  2018 Elsevier Inc[employer]  

## 2018-02-16 NOTE — Progress Notes (Signed)
Patient: Rita Webb, Female    DOB: Jul 13, 1945, 72 y.o.   MRN: [id number] Visit Date: 02/16/2018  Today's Provider: Mar Daring, PA-C   Chief Complaint  Patient presents with  . Medicare Wellness   Subjective:    Annual wellness visit Rita Webb is a 72 y.o. female. She feels well. She reports exercising walking. She reports she is sleeping well.  AWE:02/13/17 Mammogram:09/23/17 BI-RADS 1 BMD:02/08/15 Osteoporosis Colonoscopy:11/24/00-spastic colon, Colonoscopy 2013-Normal Recheck in 10 yrs. -----------------------------------------------------------   Review of Systems  Constitutional: Negative.   HENT: Positive for congestion, postnasal drip, rhinorrhea, sinus pressure, sneezing and tinnitus.   Eyes: Negative.   Respiratory: Positive for cough.   Cardiovascular: Negative.   Gastrointestinal: Negative.   Endocrine: Negative.   Genitourinary: Negative.   Musculoskeletal: Positive for neck stiffness.  Skin: Negative.   Allergic/Immunologic: Positive for environmental allergies.  Neurological: Positive for dizziness.  Hematological: Bruises/bleeds easily.  Psychiatric/Behavioral: Negative.     Social History   Socioeconomic History  . Marital status: Married    Spouse name: Not on file  . Number of children: 2  . Years of education: Not on file  . Highest education level: Not on file  Occupational History  . Not on file  Social Needs  . Financial resource strain: Not on file  . Food insecurity:    Worry: Not on file    Inability: Not on file  . Transportation needs:    Medical: Not on file    Non-medical: Not on file  Tobacco Use  . Smoking status: Former Smoker    Last attempt to quit: 11/10/1968    Years since quitting: 49.3  . Smokeless tobacco: Never Used  Substance and Sexual Activity  . Alcohol use: No  . Drug use: No  . Sexual activity: Not on file  Lifestyle  . Physical activity:    Days per week: Not on file   Minutes per session: Not on file  . Stress: Not on file  Relationships  . Social connections:    Talks on phone: Not on file    Gets together: Not on file    Attends religious service: Not on file    Active member of club or organization: Not on file    Attends meetings of clubs or organizations: Not on file    Relationship status: Not on file  . Intimate partner violence:    Fear of current or ex partner: Not on file    Emotionally abused: Not on file    Physically abused: Not on file    Forced sexual activity: Not on file  Other Topics Concern  . Not on file  Social History Narrative  . Not on file    Past Medical History:  Diagnosis Date  . Allergy   . Hyperlipidemia      Patient Active Problem List   Diagnosis Date Noted  . Family history of breast cancer in mother 02/13/2017  . Osteoporosis 02/27/2015  . Allergic rhinitis 11/11/2014  . Hypercholesteremia 11/11/2014  . History of malignant melanoma of skin 11/11/2014  . Avitaminosis D 11/11/2014    Past Surgical History:  Procedure Laterality Date  . GANGLION CYST EXCISION Right 1996   wrist  . MELANOMA EXCISION  08/2011  . SPINE SURGERY      Her family history includes Breast cancer (age of onset: 24) in her mother; Diabetes in her maternal aunt and maternal uncle; Prostate cancer in her father.  Current Outpatient Medications:  .  B COMPLEX-C PO, Take 1 tablet by mouth daily. , Disp: , Rfl:  .  Calcium Carbonate-Vitamin D 600-200 MG-UNIT CAPS, Take 2 capsules by mouth daily. , Disp: , Rfl:  .  Cetirizine HCl 10 MG CAPS, Take 1 capsule by mouth daily. , Disp: , Rfl:  .  Cholecalciferol 1000 UNITS capsule, Take 1,000 Units by mouth daily. , Disp: , Rfl:  .  Ibuprofen 200 MG CAPS, Take 1 capsule by mouth as needed. , Disp: , Rfl:  .  montelukast (SINGULAIR) 10 MG tablet, Take 10 mg by mouth at bedtime. , Disp: , Rfl:  .  MULTIPLE VITAMIN PO, Take 1 tablet by mouth daily. , Disp: , Rfl:  .  Omega-3 Fatty  Acids (FISH OIL) 1200 MG CAPS, Take 1 capsule by mouth daily. , Disp: , Rfl:  .  oxymetazoline (MUCINEX NASAL SPRAY FULL FORCE) 0.05 % nasal spray, Place 1 spray into both nostrils 2 (two) times daily., Disp: , Rfl:  .  psyllium (REGULOID) 0.52 G capsule, Take 1 capsule by mouth 6 (six) times daily. , Disp: , Rfl:  .  simvastatin (ZOCOR) 10 MG tablet, TAKE ONE TABLET BY MOUTH DAILY AT BEDTIME, Disp: 90 tablet, Rfl: 1 .  vitamin E 400 UNIT capsule, Take 400 Units by mouth daily., Disp: , Rfl:  .  UNABLE TO FIND, Med Name: Allergy Shots once a week, Disp: , Rfl:   Patient Care Team: Virginia Crews, MD as PCP - General (Family Medicine)     Objective:   Vitals: BP 130/70 (BP Location: Left Arm, Patient Position: Sitting, Cuff Size: Normal)   Pulse 76   Temp 98.3 F (36.8 C) (Oral)   Resp 16   Ht 5\' 6"  (1.676 m)   Wt 146 lb 12.8 oz (66.6 kg)   BMI 23.69 kg/m   Physical Exam  Constitutional: She is oriented to person, place, and time. She appears well-developed and well-nourished. No distress.  HENT:  Head: Normocephalic and atraumatic.  Right Ear: Hearing, tympanic membrane, external ear and ear canal normal.  Left Ear: Hearing, tympanic membrane, external ear and ear canal normal.  Nose: Nose normal.  Mouth/Throat: Uvula is midline, oropharynx is clear and moist and mucous membranes are normal. No oropharyngeal exudate.  Eyes: Pupils are equal, round, and reactive to light. Conjunctivae and EOM are normal. Right eye exhibits no discharge. Left eye exhibits no discharge. No scleral icterus.  Neck: Normal range of motion. Neck supple. No JVD present. Carotid bruit is not present. No tracheal deviation present. No thyromegaly present.  Cardiovascular: Normal rate, regular rhythm, normal heart sounds and intact distal pulses. Exam reveals no gallop and no friction rub.  No murmur heard. Pulmonary/Chest: Effort normal and breath sounds normal. No respiratory distress. She has no  wheezes. She has no rales. She exhibits no tenderness.  Abdominal: Soft. Bowel sounds are normal. She exhibits no distension and no mass. There is no tenderness. There is no rebound and no guarding.  Musculoskeletal: Normal range of motion. She exhibits no edema or tenderness.  Lymphadenopathy:    She has no cervical adenopathy.  Neurological: She is alert and oriented to person, place, and time.  Skin: Skin is warm and dry. No rash noted. She is not diaphoretic.  Psychiatric: She has a normal mood and affect. Her behavior is normal. Judgment and thought content normal.  Vitals reviewed.   Activities of Daily Living In your present state of health, do  you have any difficulty performing the following activities: 02/16/2018  Hearing? N  Vision? N  Difficulty concentrating or making decisions? N  Walking or climbing stairs? N  Dressing or bathing? N  Doing errands, shopping? N  Some recent data might be hidden    Fall Risk Assessment Fall Risk  02/16/2018 02/13/2017 02/07/2016 02/02/2015  Falls in the past year? No No No No     Depression Screen PHQ 2/9 Scores 02/16/2018 02/13/2017 02/07/2016 02/02/2015  PHQ - 2 Score 0 0 0 0  PHQ- 9 Score - 1 - -    Cognitive Testing - 6-CIT  Correct? Score   What year is it? yes 0 0 or 4  What month is it? yes 0 0 or 3  Memorize:    [name],  44,  [address],  [place],      What time is it? (within 1 hour) yes 0 0 or 3  Count backwards from 20 yes 0 0, 2, or 4  Name the months of the year yes 0 0, 2, or 4  Repeat name & address above yes 0 0, 2, 4, 6, 8, or 10       TOTAL SCORE  0/28   Interpretation:  Normal  Normal (0-7) Abnormal (8-28)       Assessment & Plan:     Annual Wellness Visit  Reviewed patient's Family Medical History Reviewed and updated list of patient's medical providers Assessment of cognitive impairment was done Assessed patient's functional ability Established a written schedule for health screening [place] Completed and Reviewed  Exercise Activities and Dietary recommendations Goals    . Increase physical activity       Immunization History  Administered Date(s) Administered  . Influenza, High Dose Seasonal PF 05/11/2015, 05/04/2016, 05/08/2017  . Pneumococcal Conjugate-13 12/29/2013  . Pneumococcal Polysaccharide-23 12/28/2012  . Tdap 11/27/2011  . Zoster 01/23/2011    Health Maintenance  Topic Date Due  . INFLUENZA VACCINE  02/05/2018  . MAMMOGRAM  09/24/2019  . TETANUS/TDAP  11/26/2021  . COLONOSCOPY  01/20/2022  . DEXA SCAN  Completed  . Hepatitis C Screening  Completed  . PNA vac Low Risk Adult  Completed     Discussed health benefits of physical activity, and encouraged her to engage in regular exercise appropriate for her age and condition.    1. Medicare annual wellness visit, subsequent Normal physical exam today. Will check labs as below and f/u pending lab results. If labs are stable and WNL she will not need to have these rechecked for one year at her next annual physical exam. She is to call the office in the meantime if she has any acute issue, questions or concerns.  2. Breast cancer screening Mammogram normal in March. Positive family history in her mother. Does perform self breast exams.  3. Family history of breast cancer in mother  79. Hypercholesterolemia Stable on simvastatin 10mg . Will check labs as below and f/u pending results. - Comprehensive metabolic panel - CBC with Differential/Platelet - Lipid panel  5. Avitaminosis D Taking OTC supplement. Will check labs as below and f/u pending results. - CBC with Differential/Platelet - Lipid panel - Vitamin D (25 hydroxy)  6. Age-related osteoporosis without current pathological fracture Known from 2016 BMD. Patient declines any others at this time. Taking calcium +Vit D supplement and walks for exercise. Will check labs as below and f/u pending results. - Comprehensive metabolic  panel - CBC with Differential/Platelet -  Lipid panel  7. Need for hepatitis C screening test - Hepatitis C antibody  ------------------------------------------------------------------------------------------------------------    Mar Daring, PA-C  [facility]

## 2018-02-17 ENCOUNTER — Telehealth: Payer: Self-pay

## 2018-02-17 LAB — LIPID PANEL
CHOLESTEROL TOTAL: 168 mg/dL (ref 100–199)
Chol/HDL Ratio: 3.1 ratio (ref 0.0–4.4)
HDL: 54 mg/dL (ref >39)
LDL CALC: 93 mg/dL (ref 0–99)
TRIGLYCERIDES: 105 mg/dL (ref 0–149)
VLDL CHOLESTEROL CAL: 21 mg/dL (ref 5–40)

## 2018-02-17 LAB — CBC WITH DIFFERENTIAL/PLATELET
BASOS ABS: 0 10*3/uL (ref 0.0–0.2)
Basos: 0 %
EOS (ABSOLUTE): 0.1 10*3/uL (ref 0.0–0.4)
EOS: 1 %
Hematocrit: 41.7 % (ref 34.0–46.6)
Hemoglobin: 13.4 g/dL (ref 11.1–15.9)
IMMATURE GRANULOCYTES: 0 %
Immature Grans (Abs): 0 10*3/uL (ref 0.0–0.1)
LYMPHS: 38 %
Lymphocytes Absolute: 2.9 10*3/uL (ref 0.7–3.1)
MCH: 30.5 pg (ref 26.6–33.0)
MCHC: 32.1 g/dL (ref 31.5–35.7)
MCV: 95 fL (ref 79–97)
MONOCYTES: 6 %
Monocytes Absolute: 0.5 10*3/uL (ref 0.1–0.9)
NEUTROS PCT: 55 %
Neutrophils Absolute: 4.2 10*3/uL (ref 1.4–7.0)
Platelets: 261 10*3/uL (ref 150–450)
RBC: 4.39 x10E6/uL (ref 3.77–5.28)
RDW: 14 % (ref 12.3–15.4)
WBC: 7.7 10*3/uL (ref 3.4–10.8)

## 2018-02-17 LAB — COMPREHENSIVE METABOLIC PANEL
ALBUMIN: 4.5 g/dL (ref 3.5–4.8)
ALK PHOS: 87 IU/L (ref 39–117)
ALT: 18 IU/L (ref 0–32)
AST: 24 IU/L (ref 0–40)
Albumin/Globulin Ratio: 1.9 (ref 1.2–2.2)
BUN/Creatinine Ratio: 16 (ref 12–28)
BUN: 12 mg/dL (ref 8–27)
Bilirubin Total: 0.3 mg/dL (ref 0.0–1.2)
CHLORIDE: 105 mmol/L (ref 96–106)
CO2: 23 mmol/L (ref 20–29)
Calcium: 9.1 mg/dL (ref 8.7–10.3)
Creatinine, Ser: 0.75 mg/dL (ref 0.57–1.00)
GFR calc Af Amer: 93 mL/min/{1.73_m2} (ref >59)
GFR calc non Af Amer: 81 mL/min/{1.73_m2} (ref >59)
GLUCOSE: 88 mg/dL (ref 65–99)
Globulin, Total: 2.4 g/dL (ref 1.5–4.5)
Potassium: 4.5 mmol/L (ref 3.5–5.2)
Sodium: 143 mmol/L (ref 134–144)
Total Protein: 6.9 g/dL (ref 6.0–8.5)

## 2018-02-17 LAB — HEPATITIS C ANTIBODY: HEP C VIRUS AB: 0.3 {s_co_ratio} (ref 0.0–0.9)

## 2018-02-17 LAB — VITAMIN D 25 HYDROXY (VIT D DEFICIENCY, FRACTURES): Vit D, 25-Hydroxy: 41.4 ng/mL (ref 30.0–100.0)

## 2018-02-17 NOTE — Telephone Encounter (Signed)
-----   Message from Mar Daring, [place] sent at 02/17/2018 12:53 PM EDT ----- All labs are within normal limits and stable.  Thanks! -JB

## 2018-02-17 NOTE — Telephone Encounter (Signed)
Patient advised as directed below.  Thanks,  -Roizy Harold 

## 2018-02-18 DIAGNOSIS — J309 Allergic rhinitis, unspecified: Secondary | ICD-10-CM | POA: Diagnosis not present

## 2018-02-23 DIAGNOSIS — J301 Allergic rhinitis due to pollen: Secondary | ICD-10-CM | POA: Diagnosis not present

## 2018-03-02 DIAGNOSIS — J301 Allergic rhinitis due to pollen: Secondary | ICD-10-CM | POA: Diagnosis not present

## 2018-03-11 DIAGNOSIS — J301 Allergic rhinitis due to pollen: Secondary | ICD-10-CM | POA: Diagnosis not present

## 2018-03-16 DIAGNOSIS — J301 Allergic rhinitis due to pollen: Secondary | ICD-10-CM | POA: Diagnosis not present

## 2018-03-18 DIAGNOSIS — J301 Allergic rhinitis due to pollen: Secondary | ICD-10-CM | POA: Diagnosis not present

## 2018-03-23 DIAGNOSIS — J301 Allergic rhinitis due to pollen: Secondary | ICD-10-CM | POA: Diagnosis not present

## 2018-03-30 DIAGNOSIS — J301 Allergic rhinitis due to pollen: Secondary | ICD-10-CM | POA: Diagnosis not present

## 2018-04-06 DIAGNOSIS — J301 Allergic rhinitis due to pollen: Secondary | ICD-10-CM | POA: Diagnosis not present

## 2018-04-13 DIAGNOSIS — J301 Allergic rhinitis due to pollen: Secondary | ICD-10-CM | POA: Diagnosis not present

## 2018-04-20 DIAGNOSIS — J301 Allergic rhinitis due to pollen: Secondary | ICD-10-CM | POA: Diagnosis not present

## 2018-04-25 ENCOUNTER — Ambulatory Visit (INDEPENDENT_AMBULATORY_CARE_PROVIDER_SITE_OTHER): Payer: Medicare Other

## 2018-04-25 DIAGNOSIS — Z23 Encounter for immunization: Secondary | ICD-10-CM | POA: Diagnosis not present

## 2018-04-27 DIAGNOSIS — J301 Allergic rhinitis due to pollen: Secondary | ICD-10-CM | POA: Diagnosis not present

## 2018-05-04 DIAGNOSIS — J301 Allergic rhinitis due to pollen: Secondary | ICD-10-CM | POA: Diagnosis not present

## 2018-05-11 DIAGNOSIS — J301 Allergic rhinitis due to pollen: Secondary | ICD-10-CM | POA: Diagnosis not present

## 2018-05-18 DIAGNOSIS — J301 Allergic rhinitis due to pollen: Secondary | ICD-10-CM | POA: Diagnosis not present

## 2018-05-25 DIAGNOSIS — J301 Allergic rhinitis due to pollen: Secondary | ICD-10-CM | POA: Diagnosis not present

## 2018-05-29 DIAGNOSIS — J301 Allergic rhinitis due to pollen: Secondary | ICD-10-CM | POA: Diagnosis not present

## 2018-06-01 DIAGNOSIS — J301 Allergic rhinitis due to pollen: Secondary | ICD-10-CM | POA: Diagnosis not present

## 2018-06-08 DIAGNOSIS — J301 Allergic rhinitis due to pollen: Secondary | ICD-10-CM | POA: Diagnosis not present

## 2018-06-15 DIAGNOSIS — J301 Allergic rhinitis due to pollen: Secondary | ICD-10-CM | POA: Diagnosis not present

## 2018-06-22 DIAGNOSIS — J301 Allergic rhinitis due to pollen: Secondary | ICD-10-CM | POA: Diagnosis not present

## 2018-06-29 DIAGNOSIS — J301 Allergic rhinitis due to pollen: Secondary | ICD-10-CM | POA: Diagnosis not present

## 2018-07-06 DIAGNOSIS — J301 Allergic rhinitis due to pollen: Secondary | ICD-10-CM | POA: Diagnosis not present

## 2018-07-13 DIAGNOSIS — J301 Allergic rhinitis due to pollen: Secondary | ICD-10-CM | POA: Diagnosis not present

## 2018-07-20 DIAGNOSIS — J301 Allergic rhinitis due to pollen: Secondary | ICD-10-CM | POA: Diagnosis not present

## 2018-07-27 DIAGNOSIS — J301 Allergic rhinitis due to pollen: Secondary | ICD-10-CM | POA: Diagnosis not present

## 2018-08-03 DIAGNOSIS — J301 Allergic rhinitis due to pollen: Secondary | ICD-10-CM | POA: Diagnosis not present

## 2018-08-10 DIAGNOSIS — J301 Allergic rhinitis due to pollen: Secondary | ICD-10-CM | POA: Diagnosis not present

## 2018-08-17 DIAGNOSIS — J301 Allergic rhinitis due to pollen: Secondary | ICD-10-CM | POA: Diagnosis not present

## 2018-08-18 ENCOUNTER — Other Ambulatory Visit: Payer: Self-pay | Admitting: Family Medicine

## 2018-08-18 DIAGNOSIS — Z1231 Encounter for screening mammogram for malignant neoplasm of breast: Secondary | ICD-10-CM

## 2018-08-24 DIAGNOSIS — J301 Allergic rhinitis due to pollen: Secondary | ICD-10-CM | POA: Diagnosis not present

## 2018-08-28 DIAGNOSIS — J301 Allergic rhinitis due to pollen: Secondary | ICD-10-CM | POA: Diagnosis not present

## 2018-09-07 DIAGNOSIS — J301 Allergic rhinitis due to pollen: Secondary | ICD-10-CM | POA: Diagnosis not present

## 2018-09-14 DIAGNOSIS — J301 Allergic rhinitis due to pollen: Secondary | ICD-10-CM | POA: Diagnosis not present

## 2018-09-21 DIAGNOSIS — J301 Allergic rhinitis due to pollen: Secondary | ICD-10-CM | POA: Diagnosis not present

## 2018-09-25 ENCOUNTER — Ambulatory Visit: Payer: Medicare Other

## 2018-10-14 ENCOUNTER — Ambulatory Visit: Payer: Medicare Other

## 2018-10-28 ENCOUNTER — Other Ambulatory Visit: Payer: Self-pay | Admitting: Family Medicine

## 2018-10-28 DIAGNOSIS — E7800 Pure hypercholesterolemia, unspecified: Secondary | ICD-10-CM

## 2018-10-29 DIAGNOSIS — J301 Allergic rhinitis due to pollen: Secondary | ICD-10-CM | POA: Diagnosis not present

## 2018-11-16 DIAGNOSIS — J301 Allergic rhinitis due to pollen: Secondary | ICD-10-CM | POA: Diagnosis not present

## 2018-11-17 DIAGNOSIS — J301 Allergic rhinitis due to pollen: Secondary | ICD-10-CM | POA: Diagnosis not present

## 2018-11-23 DIAGNOSIS — J301 Allergic rhinitis due to pollen: Secondary | ICD-10-CM | POA: Diagnosis not present

## 2018-11-26 ENCOUNTER — Ambulatory Visit: Payer: Medicare Other

## 2018-11-26 ENCOUNTER — Ambulatory Visit
Admission: RE | Admit: 2018-11-26 | Discharge: 2018-11-26 | Disposition: A | Discharge status: Home / Self Care | Payer: Medicare Other | Source: Ambulatory Visit | Attending: Family Medicine | Admitting: Family Medicine

## 2018-11-26 ENCOUNTER — Other Ambulatory Visit: Payer: Self-pay

## 2018-11-26 DIAGNOSIS — Z1231 Encounter for screening mammogram for malignant neoplasm of breast: Secondary | ICD-10-CM

## 2018-12-01 DIAGNOSIS — J301 Allergic rhinitis due to pollen: Secondary | ICD-10-CM | POA: Diagnosis not present

## 2018-12-07 DIAGNOSIS — J301 Allergic rhinitis due to pollen: Secondary | ICD-10-CM | POA: Diagnosis not present

## 2018-12-14 DIAGNOSIS — J301 Allergic rhinitis due to pollen: Secondary | ICD-10-CM | POA: Diagnosis not present

## 2018-12-21 DIAGNOSIS — J301 Allergic rhinitis due to pollen: Secondary | ICD-10-CM | POA: Diagnosis not present

## 2018-12-28 DIAGNOSIS — J301 Allergic rhinitis due to pollen: Secondary | ICD-10-CM | POA: Diagnosis not present

## 2019-01-06 DIAGNOSIS — J301 Allergic rhinitis due to pollen: Secondary | ICD-10-CM | POA: Diagnosis not present

## 2019-01-13 DIAGNOSIS — J301 Allergic rhinitis due to pollen: Secondary | ICD-10-CM | POA: Diagnosis not present

## 2019-01-20 DIAGNOSIS — J301 Allergic rhinitis due to pollen: Secondary | ICD-10-CM | POA: Diagnosis not present

## 2019-01-27 DIAGNOSIS — J301 Allergic rhinitis due to pollen: Secondary | ICD-10-CM | POA: Diagnosis not present

## 2019-02-03 DIAGNOSIS — J301 Allergic rhinitis due to pollen: Secondary | ICD-10-CM | POA: Diagnosis not present

## 2019-02-05 DIAGNOSIS — J301 Allergic rhinitis due to pollen: Secondary | ICD-10-CM | POA: Diagnosis not present

## 2019-02-10 DIAGNOSIS — J301 Allergic rhinitis due to pollen: Secondary | ICD-10-CM | POA: Diagnosis not present

## 2019-02-17 DIAGNOSIS — J301 Allergic rhinitis due to pollen: Secondary | ICD-10-CM | POA: Diagnosis not present

## 2019-02-17 NOTE — Progress Notes (Signed)
Patient: Rita Webb, Female    DOB: 1945/10/23, 73 y.o.   MRN: [id number] Visit Date: 02/18/2019  Today's Provider: Lavon Paganini, MD   Chief Complaint  Patient presents with  . Medicare Wellness   Subjective:     Annual wellness visit Rita Webb is a 73 y.o. female. She feels well. She reports exercising is yardwork. She reports she is sleeping well.  ----------------------------------------------------------- Last Pap:11/27/2011 Last mammogram:11/26/2018  Review of Systems  Social History   Socioeconomic History  . Marital status: Married    Spouse name: Not on file  . Number of children: 2  . Years of education: Not on file  . Highest education level: Not on file  Occupational History  . Not on file  Social Needs  . Financial resource strain: Not on file  . Food insecurity    Worry: Not on file    Inability: Not on file  . Transportation needs    Medical: Not on file    Non-medical: Not on file  Tobacco Use  . Smoking status: Former Smoker    Quit date: 11/10/1968    Years since quitting: 50.3  . Smokeless tobacco: Never Used  Substance and Sexual Activity  . Alcohol use: No  . Drug use: No  . Sexual activity: Not on file  Lifestyle  . Physical activity    Days per week: Not on file    Minutes per session: Not on file  . Stress: Not on file  Relationships  . Social Herbalist on phone: Not on file    Gets together: Not on file    Attends religious service: Not on file    Active member of club or organization: Not on file    Attends meetings of clubs or organizations: Not on file    Relationship status: Not on file  . Intimate partner violence    Fear of current or ex partner: Not on file    Emotionally abused: Not on file    Physically abused: Not on file    Forced sexual activity: Not on file  Other Topics Concern  . Not on file  Social History Narrative  . Not on file    Past Medical History:   Diagnosis Date  . Allergy   . Hyperlipidemia      Patient Active Problem List   Diagnosis Date Noted  . Family history of breast cancer in mother 02/13/2017  . Osteoporosis 02/27/2015  . Allergic rhinitis 11/11/2014  . Hypercholesteremia 11/11/2014  . History of malignant melanoma of skin 11/11/2014  . Avitaminosis D 11/11/2014    Past Surgical History:  Procedure Laterality Date  . GANGLION CYST EXCISION Right 1996   wrist  . MELANOMA EXCISION  08/2011  . SPINE SURGERY      Her family history includes Breast cancer (age of onset: 36) in her mother; Diabetes in her maternal aunt and maternal uncle; Prostate cancer in her father.   Current Outpatient Medications:  .  B COMPLEX-C PO, Take 1 tablet by mouth daily. , Disp: , Rfl:  .  Calcium Carbonate-Vitamin D 600-200 MG-UNIT CAPS, Take 2 capsules by mouth daily. , Disp: , Rfl:  .  Cetirizine HCl 10 MG CAPS, Take 1 capsule by mouth daily. , Disp: , Rfl:  .  Cholecalciferol 1000 UNITS capsule, Take 1,000 Units by mouth daily. , Disp: , Rfl:  .  Ibuprofen 200 MG CAPS, Take 1 capsule by  mouth as needed. , Disp: , Rfl:  .  ipratropium (ATROVENT) 0.06 % nasal spray, , Disp: , Rfl:  .  montelukast (SINGULAIR) 10 MG tablet, Take 10 mg by mouth at bedtime. , Disp: , Rfl:  .  MULTIPLE VITAMIN PO, Take 1 tablet by mouth daily. , Disp: , Rfl:  .  Omega-3 Fatty Acids (FISH OIL) 1200 MG CAPS, Take 1 capsule by mouth daily. , Disp: , Rfl:  .  psyllium (REGULOID) 0.52 G capsule, Take 1 capsule by mouth 6 (six) times daily. , Disp: , Rfl:  .  simvastatin (ZOCOR) 10 MG tablet, TAKE 1 TABLET BY MOUTH DAILY AT BEDTIME, Disp: 90 tablet, Rfl: 1 .  UNABLE TO FIND, Med Name: Allergy Shots once a week, Disp: , Rfl:  .  vitamin E 400 UNIT capsule, Take 400 Units by mouth daily., Disp: , Rfl:  .  oxymetazoline (MUCINEX NASAL SPRAY FULL FORCE) 0.05 % nasal spray, Place 1 spray into both nostrils 2 (two) times daily., Disp: , Rfl:   Patient Care Team:  Virginia Crews, MD as PCP - General (Family Medicine)    Objective:    Vitals: BP 120/65 (BP Location: Left Arm, Patient Position: Sitting, Cuff Size: Normal)   Pulse 74   Temp 98.8 F (37.1 C) (Oral)   Ht 5\' 5"  (1.651 m)   Wt 144 lb 12.8 oz (65.7 kg)   SpO2 96%   BMI 24.10 kg/m   Physical Exam Vitals signs reviewed.  Constitutional:      General: She is not in acute distress.    Appearance: Normal appearance. She is well-developed. She is not diaphoretic.  HENT:     Head: Normocephalic and atraumatic.     Right Ear: Tympanic membrane, ear canal and external ear normal.     Left Ear: Tympanic membrane, ear canal and external ear normal.     Nose: Nose normal.     Mouth/Throat:     Mouth: Mucous membranes are moist.     Pharynx: Oropharynx is clear. No oropharyngeal exudate.  Eyes:     General: No scleral icterus.    Conjunctiva/sclera: Conjunctivae normal.     Pupils: Pupils are equal, round, and reactive to light.  Neck:     Musculoskeletal: Neck supple.     Thyroid: No thyromegaly.  Cardiovascular:     Rate and Rhythm: Normal rate and regular rhythm.     Pulses: Normal pulses.     Heart sounds: Normal heart sounds. No murmur.  Pulmonary:     Effort: Pulmonary effort is normal. No respiratory distress.     Breath sounds: Normal breath sounds. No wheezing or rales.  Abdominal:     General: There is no distension.     Palpations: Abdomen is soft.     Tenderness: There is no abdominal tenderness.  Musculoskeletal:        General: No deformity.     Right lower leg: No edema.     Left lower leg: No edema.  Lymphadenopathy:     Cervical: No cervical adenopathy.  Skin:    General: Skin is warm and dry.     Capillary Refill: Capillary refill takes less than 2 seconds.     Findings: No rash.  Neurological:     Mental Status: She is alert and oriented to person, place, and time. Mental status is at baseline.  Psychiatric:        Mood and Affect: Mood normal.  Behavior: Behavior normal.        Thought Content: Thought content normal.     Activities of Daily Living In your present state of health, do you have any difficulty performing the following activities: 02/18/2019  Hearing? N  Vision? N  Difficulty concentrating or making decisions? N  Walking or climbing stairs? N  Dressing or bathing? N  Doing errands, shopping? N  Some recent data might be hidden    Fall Risk Assessment Fall Risk  02/18/2019 02/16/2018 02/13/2017 02/07/2016 02/02/2015  Falls in the past year? 0 No No No No  Number falls in past yr: 0 - - - -  Injury with Fall? 0 - - - -     Depression Screen PHQ 2/9 Scores 02/18/2019 02/16/2018 02/13/2017 02/07/2016  PHQ - 2 Score 1 0 0 0  PHQ- 9 Score 1 - 1 -    No flowsheet data found.    Assessment & Plan:     Annual Wellness Visit  Reviewed patient's Family Medical History Reviewed and updated list of patient's medical providers Assessment of cognitive impairment was done Assessed patient's functional ability Established a written schedule for health screening [place] Completed and Reviewed  Exercise Activities and Dietary recommendations Goals    . Increase physical activity       Immunization History  Administered Date(s) Administered  . Influenza Split 07/21/2012  . Influenza, High Dose Seasonal PF 04/14/2014, 05/11/2015, 05/04/2016, 05/08/2017, 04/25/2018  . Influenza,inj,Quad PF,6+ Mos 04/15/2013  . Pneumococcal Conjugate-13 12/29/2013  . Pneumococcal Polysaccharide-23 12/28/2012  . Tdap 11/27/2011  . Zoster 01/23/2011  . Zoster Recombinat (Shingrix) 03/05/2018    Health Maintenance  Topic Date Due  . INFLUENZA VACCINE  02/06/2019  . MAMMOGRAM  11/25/2020  . TETANUS/TDAP  11/26/2021  . COLONOSCOPY  01/20/2022  . DEXA SCAN  Completed  . Hepatitis C Screening  Completed  . PNA vac Low Risk Adult  Completed     Discussed health benefits of physical activity, and  encouraged her to engage in regular exercise appropriate for her age and condition.    ------------------------------------------------------------------------------------------------------------  Problem List Items Addressed This Visit      Musculoskeletal and Integument   Osteoporosis   Relevant Orders   DG Bone Density    Other Visit Diagnoses    Medicare annual wellness visit, subsequent    -  Primary   Encounter for annual physical exam       Relevant Orders   Lipid panel   Comprehensive metabolic panel   CBC w/Diff/Platelet       Return in about 1 year (around 02/18/2020) for CPE/AWV.   The entirety of the information documented in the History of Present Illness, Review of Systems and Physical Exam were personally obtained by me. Portions of this information were initially documented by [facility], CMA and reviewed by me for thoroughness and accuracy.    Bacigalupo, Dionne Bucy, MD MPH [facility]

## 2019-02-18 ENCOUNTER — Ambulatory Visit (INDEPENDENT_AMBULATORY_CARE_PROVIDER_SITE_OTHER): Payer: Medicare Other | Admitting: Family Medicine

## 2019-02-18 ENCOUNTER — Encounter: Payer: Self-pay | Admitting: Family Medicine

## 2019-02-18 ENCOUNTER — Other Ambulatory Visit: Payer: Self-pay

## 2019-02-18 VITALS — BP 120/65 | HR 74 | Temp 98.8°F | Ht 65.0 in | Wt 144.8 lb

## 2019-02-18 DIAGNOSIS — M810 Age-related osteoporosis without current pathological fracture: Secondary | ICD-10-CM | POA: Diagnosis not present

## 2019-02-18 DIAGNOSIS — Z0000 Encounter for general adult medical examination without abnormal findings: Secondary | ICD-10-CM | POA: Diagnosis not present

## 2019-02-18 DIAGNOSIS — R739 Hyperglycemia, unspecified: Secondary | ICD-10-CM | POA: Diagnosis not present

## 2019-02-18 NOTE — Patient Instructions (Signed)
Preventive Care 38 Years and Older, Female Preventive care refers to lifestyle choices and visits with your health care provider that can promote health and wellness. This includes:  A yearly physical exam. This is also called an annual well check.  Regular dental and eye exams.  Immunizations.  Screening for certain conditions.  Healthy lifestyle choices, such as diet and exercise. What can I expect for my preventive care visit? Physical exam Your health care provider will check:  Height and weight. These may be used to calculate body mass index (BMI), which is a measurement that tells if you are at a healthy weight.  Heart rate and blood pressure.  Your skin for abnormal spots. Counseling Your health care provider may ask you questions about:  Alcohol, tobacco, and drug use.  Emotional well-being.  Home and relationship well-being.  Sexual activity.  Eating habits.  History of falls.  Memory and ability to understand (cognition).  Work and work Statistician.  Pregnancy and menstrual history. What immunizations do I need?  Influenza (flu) vaccine  This is recommended every year. Tetanus, diphtheria, and pertussis (Tdap) vaccine  You may need a Td booster every 10 years. Varicella (chickenpox) vaccine  You may need this vaccine if you have not already been vaccinated. Zoster (shingles) vaccine  You may need this after age 33. Pneumococcal conjugate (PCV13) vaccine  One dose is recommended after age 33. Pneumococcal polysaccharide (PPSV23) vaccine  One dose is recommended after age 72. Measles, mumps, and rubella (MMR) vaccine  You may need at least one dose of MMR if you were born in 1957 or later. You may also need a second dose. Meningococcal conjugate (MenACWY) vaccine  You may need this if you have certain conditions. Hepatitis A vaccine  You may need this if you have certain conditions or if you travel or work in places where you may be exposed  to hepatitis A. Hepatitis B vaccine  You may need this if you have certain conditions or if you travel or work in places where you may be exposed to hepatitis B. Haemophilus influenzae type b (Hib) vaccine  You may need this if you have certain conditions. You may receive vaccines as individual doses or as more than one vaccine together in one shot (combination vaccines). Talk with your health care provider about the risks and benefits of combination vaccines. What tests do I need? Blood tests  Lipid and cholesterol levels. These may be checked every 5 years, or more frequently depending on your overall health.  Hepatitis C test.  Hepatitis B test. Screening  Lung cancer screening. You may have this screening every year starting at age 39 if you have a 30-pack-year history of smoking and currently smoke or have quit within the past 15 years.  Colorectal cancer screening. All adults should have this screening starting at age 36 and continuing until age 15. Your health care provider may recommend screening at age 23 if you are at increased risk. You will have tests every 1-10 years, depending on your results and the type of screening test.  Diabetes screening. This is done by checking your blood sugar (glucose) after you have not eaten for a while (fasting). You may have this done every 1-3 years.  Mammogram. This may be done every 1-2 years. Talk with your health care provider about how often you should have regular mammograms.  BRCA-related cancer screening. This may be done if you have a family history of breast, ovarian, tubal, or peritoneal cancers.  Other tests  Sexually transmitted disease (STD) testing.  Bone density scan. This is done to screen for osteoporosis. You may have this done starting at age 76. Follow these instructions at home: Eating and drinking  Eat a diet that includes fresh fruits and vegetables, whole grains, lean protein, and low-fat dairy products. Limit  your intake of foods with high amounts of sugar, saturated fats, and salt.  Take vitamin and mineral supplements as recommended by your health care provider.  Do not drink alcohol if your health care provider tells you not to drink.  If you drink alcohol: ? Limit how much you have to 0-1 drink a day. ? Be aware of how much alcohol is in your drink. In the [place] one drink equals one 12 oz bottle of beer (355 mL), one 5 oz glass of wine (148 mL), or one 1 oz glass of hard liquor (44 mL). Lifestyle  Take daily care of your teeth and gums.  Stay active. Exercise for at least 30 minutes on 5 or more days each week.  Do not use any products that contain nicotine or tobacco, such as cigarettes, e-cigarettes, and chewing tobacco. If you need help quitting, ask your health care provider.  If you are sexually active, practice safe sex. Use a condom or other form of protection in order to prevent STIs (sexually transmitted infections).  Talk with your health care provider about taking a low-dose aspirin or statin. What's next?  Go to your health care provider once a year for a well check visit.  Ask your health care provider how often you should have your eyes and teeth checked.  Stay up to date on all vaccines. This information is not intended to replace advice given to you by your health care provider. Make sure you discuss any questions you have with your health care provider. Document Released: 07/21/2015 Document Revised: 06/18/2018 Document Reviewed: 06/18/2018 Elsevier Patient Education  2020 [employer].

## 2019-02-19 ENCOUNTER — Telehealth: Payer: Self-pay

## 2019-02-19 LAB — LIPID PANEL
Chol/HDL Ratio: 3.1 ratio (ref 0.0–4.4)
Cholesterol, Total: 169 mg/dL (ref 100–199)
HDL: 55 mg/dL (ref >39)
LDL Calculated: 96 mg/dL (ref 0–99)
Triglycerides: 91 mg/dL (ref 0–149)
VLDL Cholesterol Cal: 18 mg/dL (ref 5–40)

## 2019-02-19 LAB — CBC WITH DIFFERENTIAL/PLATELET
Basophils Absolute: 0.1 10*3/uL (ref 0.0–0.2)
Basos: 1 %
EOS (ABSOLUTE): 0.1 10*3/uL (ref 0.0–0.4)
Eos: 1 %
Hematocrit: 39.4 % (ref 34.0–46.6)
Hemoglobin: 13.1 g/dL (ref 11.1–15.9)
Immature Grans (Abs): 0 10*3/uL (ref 0.0–0.1)
Immature Granulocytes: 0 %
Lymphocytes Absolute: 2.5 10*3/uL (ref 0.7–3.1)
Lymphs: 38 %
MCH: 30.2 pg (ref 26.6–33.0)
MCHC: 33.2 g/dL (ref 31.5–35.7)
MCV: 91 fL (ref 79–97)
Monocytes Absolute: 0.4 10*3/uL (ref 0.1–0.9)
Monocytes: 5 %
Neutrophils Absolute: 3.6 10*3/uL (ref 1.4–7.0)
Neutrophils: 55 %
Platelets: 287 10*3/uL (ref 150–450)
RBC: 4.34 x10E6/uL (ref 3.77–5.28)
RDW: 12.7 % (ref 11.7–15.4)
WBC: 6.6 10*3/uL (ref 3.4–10.8)

## 2019-02-19 LAB — COMPREHENSIVE METABOLIC PANEL
ALT: 18 IU/L (ref 0–32)
AST: 22 IU/L (ref 0–40)
Albumin/Globulin Ratio: 2.3 — ABNORMAL HIGH (ref 1.2–2.2)
Albumin: 4.5 g/dL (ref 3.7–4.7)
Alkaline Phosphatase: 81 IU/L (ref 39–117)
BUN/Creatinine Ratio: 17 (ref 12–28)
BUN: 14 mg/dL (ref 8–27)
Bilirubin Total: 0.5 mg/dL (ref 0.0–1.2)
CO2: 21 mmol/L (ref 20–29)
Calcium: 9.2 mg/dL (ref 8.7–10.3)
Chloride: 106 mmol/L (ref 96–106)
Creatinine, Ser: 0.84 mg/dL (ref 0.57–1.00)
GFR calc Af Amer: 81 mL/min/{1.73_m2} (ref >59)
GFR calc non Af Amer: 70 mL/min/{1.73_m2} (ref >59)
Globulin, Total: 2 g/dL (ref 1.5–4.5)
Glucose: 115 mg/dL — ABNORMAL HIGH (ref 65–99)
Potassium: 4.3 mmol/L (ref 3.5–5.2)
Sodium: 143 mmol/L (ref 134–144)
Total Protein: 6.5 g/dL (ref 6.0–8.5)

## 2019-02-19 NOTE — Telephone Encounter (Signed)
-----   Message from Virginia Crews, MD sent at 02/19/2019  8:09 AM EDT ----- Normal labs. Glucose is slightly elevated, but normal if patient was not fasting.

## 2019-02-19 NOTE — Telephone Encounter (Signed)
Can we add on Hemoglobin A1c?

## 2019-02-19 NOTE — Telephone Encounter (Signed)
A1C added

## 2019-02-19 NOTE — Telephone Encounter (Signed)
Patient advised. She states she was fasting for labs.

## 2019-02-22 DIAGNOSIS — J309 Allergic rhinitis, unspecified: Secondary | ICD-10-CM | POA: Diagnosis not present

## 2019-02-22 DIAGNOSIS — J45991 Cough variant asthma: Secondary | ICD-10-CM | POA: Diagnosis not present

## 2019-02-22 LAB — HGB A1C W/O EAG: Hgb A1c MFr Bld: 5.8 % — ABNORMAL HIGH (ref 4.8–5.6)

## 2019-02-22 LAB — SPECIMEN STATUS REPORT

## 2019-03-03 DIAGNOSIS — J301 Allergic rhinitis due to pollen: Secondary | ICD-10-CM | POA: Diagnosis not present

## 2019-03-08 DIAGNOSIS — D2272 Melanocytic nevi of left lower limb, including hip: Secondary | ICD-10-CM | POA: Diagnosis not present

## 2019-03-08 DIAGNOSIS — D2261 Melanocytic nevi of right upper limb, including shoulder: Secondary | ICD-10-CM | POA: Diagnosis not present

## 2019-03-08 DIAGNOSIS — D2262 Melanocytic nevi of left upper limb, including shoulder: Secondary | ICD-10-CM | POA: Diagnosis not present

## 2019-03-08 DIAGNOSIS — D225 Melanocytic nevi of trunk: Secondary | ICD-10-CM | POA: Diagnosis not present

## 2019-03-10 ENCOUNTER — Telehealth: Payer: Self-pay | Admitting: Family Medicine

## 2019-03-10 DIAGNOSIS — J301 Allergic rhinitis due to pollen: Secondary | ICD-10-CM | POA: Diagnosis not present

## 2019-03-10 NOTE — Telephone Encounter (Signed)
Patient was advised.  

## 2019-03-10 NOTE — Telephone Encounter (Signed)
Patient call was returned and patient wants to know what kind of diet she needs to do because of her blood glucose was high. Patient called and stated that someone had called advising her to do a low carb diet. Patient would like to know what is some good food choices. Please advise.

## 2019-03-10 NOTE — Telephone Encounter (Signed)
Starchy (carb) foods include: Bread, rice, pasta, potatoes, corn, crackers, bagels, muffins, all baked goods.  (Fruits, milk, and yogurt also have carbohydrate, but most of these foods will not spike your blood sugar as the starchy foods will.)  A few fruits do cause high blood sugars; use small portions of bananas (limit to 1/2 at a time), grapes, watermelon, and most tropical fruits.    Protein foods include: Meat, fish, poultry, eggs, dairy foods, and beans such as pinto and kidney beans (beans also provide carbohydrate).   1. Limit starchy foods to TWO per meal and ONE per snack. ONE portion of a starchy  food is equal to the following:   - ONE slice of bread (or its equivalent, such as half of a hamburger bun).   - 1/2 cup of a "scoopable" starchy food such as potatoes or rice.   - 15 grams of carbohydrate as shown on food label.  2. Include at every meal: a protein food, a carb food, and vegetables and/or fruit.   - Obtain twice as many veg's as protein or carbohydrate foods for both lunch and dinner.   - Fresh or frozen veg's are best.   - Try to keep frozen veg's on hand for a quick vegetable serving.

## 2019-03-10 NOTE — Telephone Encounter (Signed)
Pt needing a call back on her blood glucose levels being high. Needing to discuss what she needs to do.  Please advise.  Thanks, [employer]

## 2019-03-17 ENCOUNTER — Ambulatory Visit
Admission: RE | Admit: 2019-03-17 | Discharge: 2019-03-17 | Disposition: A | Discharge status: Home / Self Care | Payer: Medicare Other | Source: Ambulatory Visit | Attending: Family Medicine | Admitting: Family Medicine

## 2019-03-17 DIAGNOSIS — Z780 Asymptomatic menopausal state: Secondary | ICD-10-CM | POA: Diagnosis not present

## 2019-03-17 DIAGNOSIS — M810 Age-related osteoporosis without current pathological fracture: Secondary | ICD-10-CM | POA: Diagnosis not present

## 2019-03-17 DIAGNOSIS — J301 Allergic rhinitis due to pollen: Secondary | ICD-10-CM | POA: Diagnosis not present

## 2019-03-18 ENCOUNTER — Telehealth: Payer: Self-pay

## 2019-03-18 NOTE — Telephone Encounter (Signed)
That is possible.  Some people feel as though they are going through detox when they go off of carbs and sugar.

## 2019-03-18 NOTE — Telephone Encounter (Signed)
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  She would be a good candidate for treatment, such as with Prolia.  If she would like to pursue this, I would recommend a referral to endocrinology

## 2019-03-18 NOTE — Telephone Encounter (Signed)
Patient was advised.  

## 2019-03-18 NOTE — Telephone Encounter (Signed)
Patient was advised and states that she will hold off a while on the referral. Patient would like to know if going off of the carbs and sugars make her nervous? Please advise.

## 2019-03-24 DIAGNOSIS — J301 Allergic rhinitis due to pollen: Secondary | ICD-10-CM | POA: Diagnosis not present

## 2019-03-31 DIAGNOSIS — J301 Allergic rhinitis due to pollen: Secondary | ICD-10-CM | POA: Diagnosis not present

## 2019-04-07 DIAGNOSIS — J301 Allergic rhinitis due to pollen: Secondary | ICD-10-CM | POA: Diagnosis not present

## 2019-04-14 DIAGNOSIS — J301 Allergic rhinitis due to pollen: Secondary | ICD-10-CM | POA: Diagnosis not present

## 2019-04-21 DIAGNOSIS — J301 Allergic rhinitis due to pollen: Secondary | ICD-10-CM | POA: Diagnosis not present

## 2019-04-27 ENCOUNTER — Other Ambulatory Visit: Payer: Self-pay | Admitting: Family Medicine

## 2019-04-27 DIAGNOSIS — E7800 Pure hypercholesterolemia, unspecified: Secondary | ICD-10-CM

## 2019-04-28 DIAGNOSIS — J301 Allergic rhinitis due to pollen: Secondary | ICD-10-CM | POA: Diagnosis not present

## 2019-05-05 DIAGNOSIS — J301 Allergic rhinitis due to pollen: Secondary | ICD-10-CM | POA: Diagnosis not present

## 2019-05-07 DIAGNOSIS — J301 Allergic rhinitis due to pollen: Secondary | ICD-10-CM | POA: Diagnosis not present

## 2019-05-12 DIAGNOSIS — J301 Allergic rhinitis due to pollen: Secondary | ICD-10-CM | POA: Diagnosis not present

## 2019-05-19 DIAGNOSIS — J301 Allergic rhinitis due to pollen: Secondary | ICD-10-CM | POA: Diagnosis not present

## 2019-05-26 DIAGNOSIS — J301 Allergic rhinitis due to pollen: Secondary | ICD-10-CM | POA: Diagnosis not present

## 2019-06-02 DIAGNOSIS — J301 Allergic rhinitis due to pollen: Secondary | ICD-10-CM | POA: Diagnosis not present

## 2019-06-09 DIAGNOSIS — J301 Allergic rhinitis due to pollen: Secondary | ICD-10-CM | POA: Diagnosis not present

## 2019-06-16 DIAGNOSIS — J301 Allergic rhinitis due to pollen: Secondary | ICD-10-CM | POA: Diagnosis not present

## 2019-06-23 DIAGNOSIS — J301 Allergic rhinitis due to pollen: Secondary | ICD-10-CM | POA: Diagnosis not present

## 2019-06-30 DIAGNOSIS — J301 Allergic rhinitis due to pollen: Secondary | ICD-10-CM | POA: Diagnosis not present

## 2019-07-07 DIAGNOSIS — J301 Allergic rhinitis due to pollen: Secondary | ICD-10-CM | POA: Diagnosis not present

## 2019-07-09 DIAGNOSIS — C649 Malignant neoplasm of unspecified kidney, except renal pelvis: Secondary | ICD-10-CM

## 2019-07-09 HISTORY — DX: Malignant neoplasm of unspecified kidney, except renal pelvis: C64.9

## 2019-07-14 DIAGNOSIS — J301 Allergic rhinitis due to pollen: Secondary | ICD-10-CM | POA: Diagnosis not present

## 2019-07-19 ENCOUNTER — Ambulatory Visit: Payer: Medicare Other | Attending: Internal Medicine

## 2019-07-19 DIAGNOSIS — Z20822 Contact with and (suspected) exposure to covid-19: Secondary | ICD-10-CM

## 2019-07-20 LAB — NOVEL CORONAVIRUS, NAA: SARS-CoV-2, NAA: DETECTED — AB

## 2019-07-21 ENCOUNTER — Ambulatory Visit (INDEPENDENT_AMBULATORY_CARE_PROVIDER_SITE_OTHER): Payer: Medicare Other | Admitting: Family Medicine

## 2019-07-21 ENCOUNTER — Encounter: Payer: Self-pay | Admitting: Family Medicine

## 2019-07-21 ENCOUNTER — Telehealth: Payer: Self-pay | Admitting: Nurse Practitioner

## 2019-07-21 VITALS — Temp 98.1°F | Wt 131.0 lb

## 2019-07-21 DIAGNOSIS — U071 COVID-19: Secondary | ICD-10-CM | POA: Diagnosis not present

## 2019-07-21 NOTE — Telephone Encounter (Signed)
Called to Discuss with patient about Covid symptoms and the use of bamlanivimab, a monoclonal antibody infusion for those with mild to moderate Covid symptoms and at a high risk of hospitalization.     Pt is qualified for this infusion at the [facility] infusion center due to co-morbid conditions and/or a member of an at-risk group.     Patient Active Problem List   Diagnosis Date Noted  . Family history of breast cancer in mother 02/13/2017  . Osteoporosis 02/27/2015  . Allergic rhinitis 11/11/2014  . Hypercholesteremia 11/11/2014  . History of malignant melanoma of skin 11/11/2014  . Avitaminosis D 11/11/2014    Patient declines infusion at this time. Symptoms tier reviewed as well as criteria for ending isolation. Preventative practices reviewed. Patient verbalized understanding.    Patient advised to call back if she decides that she does want to get infusion. Callback number to the infusion center given. Patient advised to go to Urgent care or ED with severe symptoms.   Note: Symptoms started 07/18/19

## 2019-07-21 NOTE — Progress Notes (Signed)
Patient: Rita Webb Female    DOB: 1945/09/15   74 y.o.   MRN: [id number] Visit Date: 07/21/2019  Today's Provider: Lavon Paganini, MD   Chief Complaint  Patient presents with  . Sore Throat   Subjective:    I Rita Webb, CMA, am acting as scribe for Rita Paganini, MD. Virtual Visit via Telephone Note  I connected with Rita Webb on 07/21/19 at  3:00 PM EST by telephone and verified that I am speaking with the correct person using two identifiers.  Location: Patient location: home Provider location: [facility] Persons involved in the visit: patient, provider   I discussed the limitations, risks, security and privacy concerns of performing an evaluation and management service by telephone and the availability of in person appointments. I also discussed with the patient that there may be a patient responsible charge related to this service. The patient expressed understanding and agreed to proceed.  HPI Patient reports she is positive for COVID-19. Patient C/O sore throat, and feeling tired. Patient reports that she did have fever and headache a few days ago. Patient reports having some cough, denies any shortness of breath or wheezing. Husband tested positive on 07/18/18. Fever has subsided today.  She has not been taking any medications at this point.  She got a call to offer her monoclonal antibody therapy.  She did not except, but is able to call back to schedule this.  She wanted to be sure that we talked about it first.   Allergies  Allergen Reactions  . Cefuroxime Diarrhea     Current Outpatient Medications:  .  B COMPLEX-C PO, Take 1 tablet by mouth daily. , Disp: , Rfl:  .  Calcium Carbonate-Vitamin D 600-200 MG-UNIT CAPS, Take 2 capsules by mouth daily. , Disp: , Rfl:  .  Cetirizine HCl 10 MG CAPS, Take 1 capsule by mouth daily. , Disp: , Rfl:  .  Cholecalciferol 1000 UNITS capsule, Take 1,000 Units by mouth daily.  , Disp: , Rfl:  .  Ibuprofen 200 MG CAPS, Take 1 capsule by mouth as needed. , Disp: , Rfl:  .  ipratropium (ATROVENT) 0.06 % nasal spray, , Disp: , Rfl:  .  montelukast (SINGULAIR) 10 MG tablet, Take 10 mg by mouth at bedtime. , Disp: , Rfl:  .  MULTIPLE VITAMIN PO, Take 1 tablet by mouth daily. , Disp: , Rfl:  .  Omega-3 Fatty Acids (FISH OIL) 1200 MG CAPS, Take 1 capsule by mouth daily. , Disp: , Rfl:  .  oxymetazoline (MUCINEX NASAL SPRAY FULL FORCE) 0.05 % nasal spray, Place 1 spray into both nostrils 2 (two) times daily., Disp: , Rfl:  .  psyllium (REGULOID) 0.52 G capsule, Take 1 capsule by mouth 6 (six) times daily. , Disp: , Rfl:  .  simvastatin (ZOCOR) 10 MG tablet, TAKE 1 TABLET BY MOUTH ONCE DAILY AT BEDTIME, Disp: 90 tablet, Rfl: 1 .  UNABLE TO FIND, Med Name: Allergy Shots once a week, Disp: , Rfl:   Review of Systems  Constitutional: Positive for fatigue.  HENT: Positive for ear pain, postnasal drip, rhinorrhea and sore throat. Negative for sinus pressure and sinus pain.   Respiratory: Positive for cough. Negative for shortness of breath and wheezing.     Social History   Tobacco Use  . Smoking status: Former Smoker    Quit date: 11/10/1968    Years since quitting: 50.7  . Smokeless tobacco: Never Used  Substance Use Topics  . Alcohol use: No      Objective:   Temp 98.1 F (36.7 C) (Oral)   Wt 131 lb (59.4 kg)   BMI 21.80 kg/m  Vitals:   07/21/19 1346  Temp: 98.1 F (36.7 C)  TempSrc: Oral  Weight: 131 lb (59.4 kg)  Body mass index is 21.8 kg/m.   Physical Exam Speaking in full sentences in no acute distress  No results found for any visits on 07/21/19.     Assessment & Plan    I discussed the assessment and treatment plan with the patient. The patient was provided an opportunity to ask questions and all were answered. The patient agreed with the plan and demonstrated an understanding of the instructions.   The patient was advised to call back or  seek an in-person evaluation if the symptoms worsen or if the condition fails to improve as anticipated.  1. COVID-19 virus infection -New problem, uncertain prognosis -Was notified of positive test yesterday -Her husband is also positive, and she believes that this is her exposure -Advised on the natural course of COVID-19 and the possibility of acute worsening at day 8/9 -Encouraged her that if she qualifies for monoclonal antibody treatment, given her age and comorbidities, that she should consider taking this -We discussed the risks and benefits -She will call back to schedule her infusion -Encouraged her to treat her symptoms symptomatically otherwise with things like Mucinex, Tylenol/ibuprofen, honey/Delsym -Discussed strict return/ER precautions, especially if she develops significant shortness of breath   Follow-up as needed  Total time spent on today's visit was greater than 30 minutes, including nonface-to-face time personally spent both on the visit with the patient and on review of chart (labs and imaging), discussing labs and goals, discussing further work-up, treatment options, referrals to specialist if needed, reviewing outside records of pertinent, answering patient's questions, and coordinating care.  The entirety of the information documented in the History of Present Illness, Review of Systems and Physical Exam were personally obtained by me. Portions of this information were initially documented by Rita Webb, CMA and reviewed by me for thoroughness and accuracy.    Bacigalupo, Rita Bucy, MD MPH [facility]

## 2019-07-22 ENCOUNTER — Other Ambulatory Visit: Payer: Self-pay | Admitting: Nurse Practitioner

## 2019-07-22 DIAGNOSIS — U071 COVID-19: Secondary | ICD-10-CM

## 2019-07-22 NOTE — Progress Notes (Signed)
  I connected by phone with Rita Webb on 07/22/2019 at 9:43 AM to discuss the potential use of an new treatment for mild to moderate COVID-19 viral infection in non-hospitalized patients.  This patient is a 74 y.o. female that meets the FDA criteria for Emergency Use Authorization of bamlanivimab or casirivimab\imdevimab.  Has a (+) direct SARS-CoV-2 viral test result  Has mild or moderate COVID-19   Is ? 74 years of age and weighs ? 40 kg  Is NOT hospitalized due to COVID-19  Is NOT requiring oxygen therapy or requiring an increase in baseline oxygen flow rate due to COVID-19  Is within 10 days of symptom onset  Has at least one of the high risk factor(s) for progression to severe COVID-19 and/or hospitalization as defined in EUA.  Specific high risk criteria : >/= 72 yo   I have spoken and communicated the following to the patient or parent/caregiver:  1. FDA has authorized the emergency use of bamlanivimab and casirivimab\imdevimab for the treatment of mild to moderate COVID-19 in adults and pediatric patients with positive results of direct SARS-CoV-2 viral testing who are 72 years of age and older weighing at least 40 kg, and who are at high risk for progressing to severe COVID-19 and/or hospitalization.  2. The significant known and potential risks and benefits of bamlanivimab and casirivimab\imdevimab, and the extent to which such potential risks and benefits are unknown.  3. Information on available alternative treatments and the risks and benefits of those alternatives, including clinical trials.  4. Patients treated with bamlanivimab and casirivimab\imdevimab should continue to self-isolate and use infection control measures (e.g., wear mask, isolate, social distance, avoid sharing personal items, clean and disinfect "high touch" surfaces, and frequent handwashing) according to CDC guidelines.   5. The patient or parent/caregiver has the option to accept or refuse  bamlanivimab or casirivimab\imdevimab .  After reviewing this information with the patient, The patient agreed to proceed with receiving the bamlanimivab infusion and will be provided a copy of the Fact sheet prior to receiving the infusion.[name] 07/22/2019 9:43 AM

## 2019-07-24 ENCOUNTER — Ambulatory Visit (HOSPITAL_COMMUNITY)
Admission: RE | Admit: 2019-07-24 | Discharge: 2019-07-24 | Disposition: A | Discharge status: Home / Self Care | Payer: Medicare Other | Source: Ambulatory Visit | Attending: Pulmonary Disease | Admitting: Pulmonary Disease

## 2019-07-24 DIAGNOSIS — Z23 Encounter for immunization: Secondary | ICD-10-CM | POA: Insufficient documentation

## 2019-07-24 DIAGNOSIS — U071 COVID-19: Secondary | ICD-10-CM | POA: Diagnosis present

## 2019-07-24 MED ORDER — METHYLPREDNISOLONE SODIUM SUCC 125 MG IJ SOLR: 125.0000 mg | Freq: Once | INTRAMUSCULAR | Status: DC | PRN

## 2019-07-24 MED ORDER — FAMOTIDINE IN NACL 20-0.9 MG/50ML-% IV SOLN: 20.0000 mg | Freq: Once | INTRAVENOUS | Status: DC | PRN

## 2019-07-24 MED ORDER — DIPHENHYDRAMINE HCL 50 MG/ML IJ SOLN: 50.0000 mg | Freq: Once | INTRAMUSCULAR | Status: DC | PRN

## 2019-07-24 MED ORDER — EPINEPHRINE 0.3 MG/0.3ML IJ SOAJ: 0.3000 mg | Freq: Once | INTRAMUSCULAR | Status: DC | PRN

## 2019-07-24 MED ORDER — ALBUTEROL SULFATE HFA 108 (90 BASE) MCG/ACT IN AERS: 2.0000 | INHALATION_SPRAY | Freq: Once | RESPIRATORY_TRACT | Status: DC | PRN

## 2019-07-24 MED ORDER — SODIUM CHLORIDE 0.9 % IV SOLN
700.0000 mg | Freq: Once | INTRAVENOUS | Status: AC
  Administered 2019-07-24: 700 mg via INTRAVENOUS
  Filled 2019-07-24: qty 20

## 2019-07-24 MED ORDER — SODIUM CHLORIDE 0.9 % IV SOLN
INTRAVENOUS | Status: DC | PRN
  Administered 2019-07-24: 250 mL via INTRAVENOUS

## 2019-07-24 NOTE — Discharge Instructions (Signed)
10 Things You Can Do to Manage Your COVID-19 Symptoms at Home If you have possible or confirmed COVID-19: 1. Stay home from work and school. And stay away from other public places. If you must go out, avoid using any kind of public transportation, ridesharing, or taxis. 2. Monitor your symptoms carefully. If your symptoms get worse, call your healthcare provider immediately. 3. Get rest and stay hydrated. 4. If you have a medical appointment, call the healthcare provider ahead of time and tell them that you have or may have COVID-19. 5. For medical emergencies, call 911 and notify the dispatch personnel that you have or may have COVID-19. 6. Cover your cough and sneezes with a tissue or use the inside of your elbow. 7. Wash your hands often with soap and water for at least 20 seconds or clean your hands with an alcohol-based hand sanitizer that contains at least 60% alcohol. 8. As much as possible, stay in a specific room and away from other people in your home. Also, you should use a separate bathroom, if available. If you need to be around other people in or outside of the home, wear a mask. 9. Avoid sharing personal items with other people in your household, like dishes, towels, and bedding. 10. Clean all surfaces that are touched often, like counters, tabletops, and doorknobs. Use household cleaning sprays or wipes according to the label instructions. [email or web address] 01/06/2019 This information is not intended to replace advice given to you by your health care provider. Make sure you discuss any questions you have with your health care provider. Document Revised: 06/10/2019 Document Reviewed: 06/10/2019 Elsevier Patient Education  [place].  What types of side effects do monoclonal antibody drugs cause?  Common side effects  . In general, the more common side effects caused by monoclonal antibody drugs include:gic reactions, such as hives or itching . Flu-like signs and  symptoms, including chills, fatigue, fever, and muscle aches and pains . Nausea, vomiting . Diarrhea . Skin rashes . Low blood pressure  Aller  The CDC is recommending patients who receive monoclonal antibody treatments wait at least 90 days before being vaccinated.  Currently, there are no data on the safety and efficacy of mRNA COVID-19 vaccines in persons who received monoclonal antibodies or convalescent plasma as part of COVID-19 treatment. Based on the estimated half-life of such therapies as well as evidence suggesting that reinfection is uncommon in the 90 days after initial infection, vaccination should be deferred for at least 90 days, as a precautionary measure until additional information becomes available, to avoid interference of the antibody treatment with vaccine-induced immune responses.

## 2019-07-24 NOTE — Progress Notes (Signed)
  Diagnosis: COVID-19  Physician: Dr. Wright  Procedure: [facility] Med: bamlanivimab infusion - Provided patient with bamlanimivab fact sheet for patients, parents and caregivers prior to infusion.  Complications: No immediate complications noted.  Discharge: Discharged home   Rita Webb 07/24/2019   

## 2019-07-26 ENCOUNTER — Other Ambulatory Visit: Payer: Self-pay | Admitting: Family Medicine

## 2019-07-26 DIAGNOSIS — E7800 Pure hypercholesterolemia, unspecified: Secondary | ICD-10-CM

## 2019-08-06 DIAGNOSIS — J301 Allergic rhinitis due to pollen: Secondary | ICD-10-CM | POA: Diagnosis not present

## 2019-08-11 DIAGNOSIS — J301 Allergic rhinitis due to pollen: Secondary | ICD-10-CM | POA: Diagnosis not present

## 2019-08-18 DIAGNOSIS — J301 Allergic rhinitis due to pollen: Secondary | ICD-10-CM | POA: Diagnosis not present

## 2019-08-25 DIAGNOSIS — J301 Allergic rhinitis due to pollen: Secondary | ICD-10-CM | POA: Diagnosis not present

## 2019-09-01 DIAGNOSIS — J301 Allergic rhinitis due to pollen: Secondary | ICD-10-CM | POA: Diagnosis not present

## 2019-09-06 DIAGNOSIS — H25013 Cortical age-related cataract, bilateral: Secondary | ICD-10-CM | POA: Diagnosis not present

## 2019-09-06 DIAGNOSIS — H25043 Posterior subcapsular polar age-related cataract, bilateral: Secondary | ICD-10-CM | POA: Diagnosis not present

## 2019-09-06 DIAGNOSIS — H18453 Nodular corneal degeneration, bilateral: Secondary | ICD-10-CM | POA: Diagnosis not present

## 2019-09-06 DIAGNOSIS — H2513 Age-related nuclear cataract, bilateral: Secondary | ICD-10-CM | POA: Diagnosis not present

## 2019-09-08 DIAGNOSIS — J301 Allergic rhinitis due to pollen: Secondary | ICD-10-CM | POA: Diagnosis not present

## 2019-09-10 DIAGNOSIS — K8050 Calculus of bile duct without cholangitis or cholecystitis without obstruction: Secondary | ICD-10-CM | POA: Insufficient documentation

## 2019-09-10 DIAGNOSIS — R109 Unspecified abdominal pain: Secondary | ICD-10-CM | POA: Diagnosis not present

## 2019-09-10 DIAGNOSIS — Z87891 Personal history of nicotine dependence: Secondary | ICD-10-CM | POA: Diagnosis not present

## 2019-09-10 DIAGNOSIS — E785 Hyperlipidemia, unspecified: Secondary | ICD-10-CM | POA: Diagnosis not present

## 2019-09-10 DIAGNOSIS — N2889 Other specified disorders of kidney and ureter: Secondary | ICD-10-CM | POA: Diagnosis not present

## 2019-09-10 DIAGNOSIS — K8020 Calculus of gallbladder without cholecystitis without obstruction: Secondary | ICD-10-CM | POA: Diagnosis not present

## 2019-09-10 DIAGNOSIS — R1011 Right upper quadrant pain: Secondary | ICD-10-CM | POA: Diagnosis not present

## 2019-09-13 ENCOUNTER — Other Ambulatory Visit: Payer: Self-pay

## 2019-09-13 ENCOUNTER — Ambulatory Visit (INDEPENDENT_AMBULATORY_CARE_PROVIDER_SITE_OTHER): Payer: Medicare Other | Admitting: Family Medicine

## 2019-09-13 ENCOUNTER — Telehealth: Payer: Self-pay

## 2019-09-13 DIAGNOSIS — N2889 Other specified disorders of kidney and ureter: Secondary | ICD-10-CM | POA: Insufficient documentation

## 2019-09-13 DIAGNOSIS — K8020 Calculus of gallbladder without cholecystitis without obstruction: Secondary | ICD-10-CM | POA: Insufficient documentation

## 2019-09-13 NOTE — Telephone Encounter (Signed)
FYI... Pt has a phone visit scheduled for 1:20 this afternoon.  Pt also requested a conference call with her daughter Effie Berkshire [phone]  Thanks,   -Mickel Baas

## 2019-09-13 NOTE — Progress Notes (Signed)
Patient: Nakeia Calvi Female    DOB: 1946/05/08   74 y.o.   MRN: [id number] Visit Date: 09/13/2019  Today's Provider: Lavon Paganini, MD   No chief complaint on file.  Subjective:    Virtual Visit via Telephone Note  I connected with Inez Catalina on 09/13/19 at  1:20 PM EST by telephone and verified that I am speaking with the correct person using two identifiers.  Location:  Patient location: home Provider location: [facility] Persons involved in the visit: patient, provider, patient's husband, patient's daughter Lovey Newcomer, Medical student Chipper Herb   I discussed the limitations, risks, security and privacy concerns of performing an evaluation and management service by telephone and the availability of in person appointments. I also discussed with the patient that there may be a patient responsible charge related to this service. The patient expressed understanding and agreed to proceed.  HPI  Went to ED in [place] over the weekend for RUQ pain after eating.  EMS took her to ED.  RUQ US revealed multiple small gall stones with no signs of cholecystitis and no biliary dilatation.  Incidentally noted 6cm solid R renal mass.  F/u CT scan showed enhancing, exophytic R renal mass most suggestive of renal cell carcinoma 6.5cm.  Also with R adrenal adenoma.  She is f/u today to get referrals to Urology and Oncology.  She would like to wait to see Gen Surg about gallstones and biliary colic.   Allergies  Allergen Reactions  . Cefuroxime Diarrhea     Current Outpatient Medications:  .  B COMPLEX-C PO, Take 1 tablet by mouth daily. , Disp: , Rfl:  .  Calcium Carbonate-Vitamin D 600-200 MG-UNIT CAPS, Take 2 capsules by mouth daily. , Disp: , Rfl:  .  Cetirizine HCl 10 MG CAPS, Take 1 capsule by mouth daily. , Disp: , Rfl:  .  Cholecalciferol 1000 UNITS capsule, Take 1,000 Units by mouth daily. , Disp: , Rfl:  .  Ibuprofen 200 MG CAPS, Take 1 capsule by mouth  as needed. , Disp: , Rfl:  .  ipratropium (ATROVENT) 0.06 % nasal spray, , Disp: , Rfl:  .  montelukast (SINGULAIR) 10 MG tablet, Take 10 mg by mouth at bedtime. , Disp: , Rfl:  .  MULTIPLE VITAMIN PO, Take 1 tablet by mouth daily. , Disp: , Rfl:  .  Omega-3 Fatty Acids (FISH OIL) 1200 MG CAPS, Take 1 capsule by mouth daily. , Disp: , Rfl:  .  oxymetazoline (MUCINEX NASAL SPRAY FULL FORCE) 0.05 % nasal spray, Place 1 spray into both nostrils 2 (two) times daily., Disp: , Rfl:  .  psyllium (REGULOID) 0.52 G capsule, Take 1 capsule by mouth 6 (six) times daily. , Disp: , Rfl:  .  simvastatin (ZOCOR) 10 MG tablet, TAKE ONE TABLET BY MOUTH AT BEDTIME, Disp: 90 tablet, Rfl: 1 .  UNABLE TO FIND, Med Name: Allergy Shots once a week, Disp: , Rfl:   Review of Systems  Social History   Tobacco Use  . Smoking status: Former Smoker    Quit date: 11/10/1968    Years since quitting: 50.8  . Smokeless tobacco: Never Used  Substance Use Topics  . Alcohol use: No      Objective:   There were no vitals taken for this visit. There were no vitals filed for this visit.There is no height or weight on file to calculate BMI.   Physical Exam Speaking in full sentences, NAD  No results found for any visits on 09/13/19.     Assessment & Plan    Follow Up Instructions:    I discussed the assessment and treatment plan with the patient. The patient was provided an opportunity to ask questions and all were answered. The patient agreed with the plan and demonstrated an understanding of the instructions.   The patient was advised to call back or seek an in-person evaluation if the symptoms worsen or if the condition fails to improve as anticipated.  Problem List Items Addressed This Visit      Digestive   Calculus of gallbladder without cholecystitis without obstruction    New problem with episode of biliary colic Reassuring that there is no biliary dilatation or signs of acute cholescystitis Will  refer to Gen Surg if recurs pending Urology recommendations for renal mass Encouraged low fat diet        Other   Renal mass, right - Primary    New problem, concerning for RCC No previous imaging of abd available, so unclear of prior normal Reviewed imaging reports from [place] that patient provided Will send ROI for official reports She has disc with images to bring to Urology Referrals placed today for Urology and Oncology       Relevant Orders   Ambulatory referral to Urology   Ambulatory referral to Oncology       Return if symptoms worsen or fail to improve.   The entirety of the information documented in the History of Present Illness, Review of Systems and Physical Exam were personally obtained by me. Portions of this information were initially documented by Ashley Royalty, CMA and reviewed by me for thoroughness and accuracy.    Jakwon Gayton, Dionne Bucy, MD MPH [facility]

## 2019-09-13 NOTE — Assessment & Plan Note (Signed)
New problem, concerning for RCC No previous imaging of abd available, so unclear of prior normal Reviewed imaging reports from [place] that patient provided Will send ROI for official reports She has disc with images to bring to Urology Referrals placed today for Urology and Oncology

## 2019-09-13 NOTE — Assessment & Plan Note (Signed)
New problem with episode of biliary colic Reassuring that there is no biliary dilatation or signs of acute cholescystitis Will refer to Gen Surg if recurs pending Urology recommendations for renal mass Encouraged low fat diet

## 2019-09-13 NOTE — Telephone Encounter (Signed)
Copied from [place] [id number]. Topic: General - Other >> Sep 13, 2019  8:04 AM Celene Kras wrote: Reason for CRM: Pt called stating that she is needing to see Dr. B urgently. She states that she was having severe pain in her ribs. She was transported to the emergency room. Pt states that they located a mass on her right kidney. She was advised to see her doctor urgently!. Please advise.

## 2019-09-14 NOTE — Progress Notes (Signed)
09/15/19 4:49 PM   Rita Webb 05-07-1946 [id number]  Referring provider: Virginia Crews, [address] [place],  [place] [address]  Chief Complaint  Patient presents with  . Renal Mass    HPI: Rita Webb is a 74 y.o. white F who presents today for the evaluation and management of a right renal mass. She was referred to [place] by Virginia Crews, MD. Accompanied by daughtesrs and husband via phone call.   PCP had reviewed imaging reports from [place] that the patient provided indicative of a renal mass.   She reports of being at daughter's house last Friday night celebrating her birthday with eating pizza and ice cream. She went to fix herself a coffee and had a sudden onset of epigastric pain.Marland Kitchen She went to sit down and pain was still present. She was taken to ER in [place]. She underwent an [place] to check gallbladder and right kidney mass was indicated. She was followed up with a CT scan.   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 Also, indicative is a 5 mm upper pole and 1.5 cm left renal cyst with no adenopathy. Her labs showed negative urine, creatinine 0.9, and normal alk phos. she did have mildly elevated LFTs.  She also has calculi in her gallbladder.  No blood in urine. No infection or any past gallbladder issues.   She had a tubal ligation done in the past.   No other flank pain or gross hematuria.  No weight loss.  No family history of renal cell malignancy.  PMH: Past Medical History:  Diagnosis Date  . Allergy   . Hyperlipidemia     Surgical History: Past Surgical History:  Procedure Laterality Date  . GANGLION CYST EXCISION Right 1996   wrist  . MELANOMA EXCISION  08/2011  . SPINE SURGERY      Home Medications:  Allergies as of 09/15/2019      Reactions   Cefuroxime Diarrhea      Medication  List       Accurate as of September 15, 2019  4:49 PM. If you have any questions, ask your nurse or doctor.        B COMPLEX-C PO Take 1 tablet by mouth daily.   Calcium Carbonate-Vitamin D 600-200 MG-UNIT Caps Take 2 capsules by mouth daily.   Cetirizine HCl 10 MG Caps Take 1 capsule by mouth daily.   Cholecalciferol 25 MCG (1000 UT) capsule Take 1,000 Units by mouth daily.   Fish Oil 1200 MG Caps Take 1 capsule by mouth daily.   Ibuprofen 200 MG Caps Take 1 capsule by mouth as needed.   ipratropium 0.06 % nasal spray Commonly known as: ATROVENT   montelukast 10 MG tablet Commonly known as: SINGULAIR Take 10 mg by mouth at bedtime.   Mucinex Nasal Spray Full Force 0.05 % nasal spray Generic drug: oxymetazoline Place 1 spray into both nostrils 2 (two) times daily.   MULTIPLE VITAMIN PO Take 1 tablet by mouth daily.   psyllium 0.52 g capsule Commonly known as: REGULOID Take 1 capsule by mouth 6 (six) times daily.   simvastatin 10 MG tablet Commonly known as: ZOCOR TAKE ONE TABLET BY MOUTH AT BEDTIME   UNABLE TO FIND Med Name: Allergy Shots once a week       Allergies:  Allergies  Allergen Reactions  . Cefuroxime Diarrhea    Family History: Family History  Problem Relation Age of Onset  . Breast cancer Mother 44  . Prostate cancer Father   . Diabetes Maternal Aunt   . Diabetes Maternal Uncle     Social History:  reports that she quit smoking about 50 years ago. She has never used smokeless tobacco. She reports that she does not drink alcohol or use drugs.   Physical Exam: BP (!) 170/72   Pulse (!) 103   Ht 5' 5" (1.651 m)   Wt 121 lb (54.9 kg)   BMI 20.14 kg/m   Constitutional:  Alert and oriented, No acute distress.  Anxious. HEENT: [place] AT, moist mucus membranes.  Trachea midline, no masses. Cardiovascular: No clubbing, cyanosis, or edema. Respiratory: Normal respiratory effort, no increased work of breathing. Skin: No rashes, bruises or  suspicious lesions. Neurologic: Grossly intact, no focal deficits, moving all 4 extremities. Psychiatric: Normal mood and affect.  Assessment & Plan:    1. Right renal cell carcinoma  Reviewed CT report/ CD images from [place] indicating an enhancing renal right mass-primarily endophytic with irregular borders extending up towards the hilum and collecting system crossing the midline, ~7 cm.  No evidence of renal vein involvement or adjacent lymphadenopathy.  Suspect gall bladder issue since LFT elevated; will repeat LFT blood work today   Chest CT ordered to rule out metastasis / complete staging  May beneficial to do both cholecystectomy and nephrectomy at same time if needed; will discuss with Dr. Dahlia Byes  A solid renal mass raises the suspicion of primary renal malignancy.  We discussed this in detail and in regards to the spectrum of renal masses which includes cysts (pure cysts are considered benign), solid masses and everything in between. The risk of metastasis increases as the size of solid renal mass increases. In general, it is believed that the risk of metastasis for renal masses less than 3-4 cm is small (up to approximately 5%) based mainly on large retrospective studies.  In some cases and especially in patients of older age and multiple comorbidities a surveillance approach may be appropriate. The treatment of solid renal masses includes: surveillance, cryoablation (percutaneous and laparoscopic) in addition to partial and complete nephrectomy (each with option of laparoscopic, robotic and open depending on appropriateness). Furthermore, nephrectomy appears to be an independent risk factor for the development of chronic kidney disease suggesting that nephron sparing approaches should be implored whenever feasible. We reviewed these options in context of the patients current situation as well as the pros and cons of each.  Extensive review of imaging today, this lesion does not  appear to be amenable to partial nephrectomy based on complexity, size, proximity to the hilum and crossing the central axis of the kidney.  I recommended a right radical robot-assisted nephrectomy with consideration of laparoscopic cholecystectomy if deemed appropriate by general surgery at the same time as a combined procedure.  Discussed extra precaution of having 1 renal unit including avoiding dehydration and use of NSAIDS   Pt understood and is interested in robotic minimally invasive laparoscopic nephrectomy.  We went ahead and discussed the risk and benefits of surgery as well as the intraoperative and postoperative course at length today.  We discussed the risk of bleeding, infection, damage surrounding structures, hernia formation, ileus another bowel issues along with other more serious complications such as blood clot heart attack and stroke.    Will call with CT result  She and her family had lots of questions today in addition to the above regarding need for oncologic referral.  We discussed that the primary treatment for nonmetastatic localized renal cell malignancy is surgical resection with consideration of adjuvant therapy depending on presence or absence of adverse pathologic features.  We also discussed the role of post surveillance.  They are unsure whether or not the go see a medical oncology prior to pursuing nephrectomy per their preference.  [facility] [address] [place], [place] [address] [phone]  I, Lucas Mallow, am acting as a scribe for Dr. Hollice Espy,  I have reviewed the above documentation for accuracy and completeness, and I agree with the above.   Hollice Espy, MD  I spent 75 min with this patient of which greater than 50% was spent in counseling and coordination of care with the patient.

## 2019-09-15 ENCOUNTER — Other Ambulatory Visit: Payer: Self-pay

## 2019-09-15 ENCOUNTER — Encounter: Payer: Self-pay | Admitting: Urology

## 2019-09-15 ENCOUNTER — Ambulatory Visit: Payer: Medicare Other | Admitting: Urology

## 2019-09-15 ENCOUNTER — Telehealth: Payer: Self-pay

## 2019-09-15 DIAGNOSIS — N2889 Other specified disorders of kidney and ureter: Secondary | ICD-10-CM | POA: Diagnosis not present

## 2019-09-15 DIAGNOSIS — J301 Allergic rhinitis due to pollen: Secondary | ICD-10-CM | POA: Diagnosis not present

## 2019-09-15 NOTE — Telephone Encounter (Signed)
OK to place new referral or change one that was sent.  Was put as urgent

## 2019-09-15 NOTE — Telephone Encounter (Signed)
Copied from [place] [id number]. Topic: Referral - Request for Referral >> Sep 15, 2019  4:21 PM Rainey Pines A wrote: Has patient seen PCP for this complaint? Yes *If NO, is insurance requiring patient see PCP for this issue before PCP can refer them? Referral for which specialty: [facility]  Preferred provider/office: Dr. Louanne Skye with [facility] [phone] Reason for referral: Patient initial referral was sent to provider that isnt accepting new patients. Patient has an appointment with Dr Voncille Lo but insurance requires referral to be sent

## 2019-09-16 ENCOUNTER — Telehealth: Payer: Self-pay | Admitting: *Deleted

## 2019-09-16 LAB — HEPATIC FUNCTION PANEL
ALT: 61 IU/L — ABNORMAL HIGH (ref 0–32)
AST: 24 IU/L (ref 0–40)
Albumin: 4.8 g/dL — ABNORMAL HIGH (ref 3.7–4.7)
Alkaline Phosphatase: 97 IU/L (ref 39–117)
Bilirubin Total: 0.4 mg/dL (ref 0.0–1.2)
Bilirubin, Direct: 0.13 mg/dL (ref 0.00–0.40)
Total Protein: 7.1 g/dL (ref 6.0–8.5)

## 2019-09-16 NOTE — Telephone Encounter (Addendum)
Patient informed, verbalized understanding.   ----- Message from Hollice Espy, MD sent at 09/16/2019  8:11 AM EST ----- Your ALT is still slightly elevated but trending back to normal.   Hollice Espy, MD

## 2019-09-20 DIAGNOSIS — N2889 Other specified disorders of kidney and ureter: Secondary | ICD-10-CM | POA: Diagnosis not present

## 2019-09-21 ENCOUNTER — Telehealth: Payer: Self-pay | Admitting: Urology

## 2019-09-21 NOTE — Telephone Encounter (Signed)
Checking on this patient's chest CT.  Has yet to be schedule, can you please f/u and help expedite.  Hollice Espy, MD

## 2019-09-22 NOTE — Telephone Encounter (Signed)
The patient told Rita Webb that she had the CT scan done at [facility]? I didn't send an order for it to be done from you? I saw were she went but was not able to pull up the report?

## 2019-09-22 NOTE — Telephone Encounter (Signed)
I do understand your message.    I ordered a chest CT for staging.  It has not been done.  It does not look like it has been done at [facility] either.  She was seeing [facility] for second opinion.    If she is going to have surgery there, she can have her CT there too, does not matter.  She just needs to get done somewhere.  Hollice Espy, MD

## 2019-09-22 NOTE — Telephone Encounter (Signed)
It doesn't look like they have called her yet but I have sent Manuela Schwartz in scheduling a message to give her a call today.  Thanks

## 2019-09-22 NOTE — Telephone Encounter (Signed)
I just spoke with the patient and she stated that she told her that she was going to go to [facility] for a second opinion and has decided to stay there for her care. Sorry about the confusion.    Sharyn Lull

## 2019-09-23 ENCOUNTER — Other Ambulatory Visit: Payer: Self-pay

## 2019-09-23 ENCOUNTER — Ambulatory Visit (INDEPENDENT_AMBULATORY_CARE_PROVIDER_SITE_OTHER): Payer: Medicare Other | Admitting: Family Medicine

## 2019-09-23 ENCOUNTER — Ambulatory Visit: Payer: Self-pay | Admitting: *Deleted

## 2019-09-23 DIAGNOSIS — K8020 Calculus of gallbladder without cholecystitis without obstruction: Secondary | ICD-10-CM | POA: Diagnosis not present

## 2019-09-23 NOTE — Progress Notes (Signed)
Patient: Rita Webb Female    DOB: 03-27-1946   74 y.o.   MRN: [id number] Visit Date: 09/23/2019  Today's Provider: Lavon Paganini, MD   Chief Complaint  Patient presents with  . Nausea   Subjective:    Virtual Visit via Telephone Note  I connected with Inez Catalina on 09/23/19 at  2:40 PM EDT by telephone and verified that I am speaking with the correct person using two identifiers.  Location:  Patient location: home Provider location: [facility] Persons involved in the visit: patient, provider, patient's daughter Lovey Newcomer   I discussed the limitations, risks, security and privacy concerns of performing an evaluation and management service by telephone and the availability of in person appointments. I also discussed with the patient that there may be a patient responsible charge related to this service. The patient expressed understanding and agreed to proceed.  HPI States that she feels bad.  She is feeling weak and shaky.  Denies any abdominal pain, vomiting.  Started yesterday morning after eating a casserole with cream of chicken soup  Has a tightness in upper abdomen   Allergies  Allergen Reactions  . Cefuroxime Diarrhea     Current Outpatient Medications:  .  B COMPLEX-C PO, Take 1 tablet by mouth daily. , Disp: , Rfl:  .  Calcium Carbonate-Vitamin D 600-200 MG-UNIT CAPS, Take 2 capsules by mouth daily. , Disp: , Rfl:  .  Cetirizine HCl 10 MG CAPS, Take 1 capsule by mouth daily. , Disp: , Rfl:  .  Cholecalciferol 1000 UNITS capsule, Take 1,000 Units by mouth daily. , Disp: , Rfl:  .  Ibuprofen 200 MG CAPS, Take 1 capsule by mouth as needed. , Disp: , Rfl:  .  ipratropium (ATROVENT) 0.06 % nasal spray, , Disp: , Rfl:  .  montelukast (SINGULAIR) 10 MG tablet, Take 10 mg by mouth at bedtime. , Disp: , Rfl:  .  Omega-3 Fatty Acids (FISH OIL) 1200 MG CAPS, Take 1 capsule by mouth daily. , Disp: , Rfl:  .  oxymetazoline (MUCINEX  NASAL SPRAY FULL FORCE) 0.05 % nasal spray, Place 1 spray into both nostrils 2 (two) times daily., Disp: , Rfl:  .  psyllium (REGULOID) 0.52 G capsule, Take 1 capsule by mouth 6 (six) times daily. , Disp: , Rfl:  .  simvastatin (ZOCOR) 10 MG tablet, TAKE ONE TABLET BY MOUTH AT BEDTIME, Disp: 90 tablet, Rfl: 1 .  UNABLE TO FIND, Med Name: Allergy Shots once a week, Disp: , Rfl:  .  MULTIPLE VITAMIN PO, Take 1 tablet by mouth daily. , Disp: , Rfl:   Review of Systems  Constitutional: Positive for chills and fatigue. Negative for activity change, appetite change, diaphoresis, fever and unexpected weight change.  Gastrointestinal: Positive for abdominal pain and diarrhea. Negative for abdominal distention, anal bleeding, blood in stool, constipation, nausea, rectal pain and vomiting.  Neurological: Positive for light-headedness. Negative for dizziness and headaches.    Social History   Tobacco Use  . Smoking status: Former Smoker    Quit date: 11/10/1968    Years since quitting: 50.9  . Smokeless tobacco: Never Used  Substance Use Topics  . Alcohol use: No      Objective:   There were no vitals taken for this visit. There were no vitals filed for this visit.There is no height or weight on file to calculate BMI.   Physical Exam Speaking in full sentences in NAD  No results  found for any visits on 09/23/19.     Assessment & Plan    Follow Up Instructions:   I discussed the assessment and treatment plan with the patient. The patient was provided an opportunity to ask questions and all were answered. The patient agreed with the plan and demonstrated an understanding of the instructions.   The patient was advised to call back or seek an in-person evaluation if the symptoms worsen or if the condition fails to improve as anticipated.  1. Calculus of gallbladder without cholecystitis without obstruction - discussed need to discuss with surgery the possibility of having cholecystectomy  at same time as nephrectomy - in the meantime, very important to still get enough caloric intake and stay hydrated while decreasing fats in diet to prevent biliary colic - advised on low fat food choices, pedialyte for hydration - discussed that there is not a medication to prevent biliary colic - advised that if she develops severe pain or inability to tolerate any PO, may need to go to ED  The entirety of the information documented in the History of Present Illness, Review of Systems and Physical Exam were personally obtained by me. Portions of this information were initially documented by Ashley Royalty, CMA and reviewed by me for thoroughness and accuracy.    Josanne Boerema, Dionne Bucy, MD MPH [facility]

## 2019-09-23 NOTE — Telephone Encounter (Signed)
Pt called with having problems eating now she has had gallbladder since Marchl 5 th and now not able to eat. When she eats she feels really bad. She has been trying to eat chicken soup, the only thing right now that she can tolerate. She is starting to feel weak but is able to move around and ambulate. She drinks a 16 oz bottle of water every hour, including [place]el. She is advised to try the pedialyte and to call back if she develops worsening symptoms of not being able to ambulate and feeling like she is passing out. Per RN advice to make an appointment with pt virtually. Made for tomorrow at 8am. She is requesting to be worked in sooner than tomorrow morning if possible.  Please call her on her cell # [phone].  Will notify and route  [facility] for review.  Reason for Disposition . Nursing judgment  Protocols used: NO GUIDELINE OR REFERENCE AVAILABLE-A-AH

## 2019-09-23 NOTE — Telephone Encounter (Signed)
Apt for this afternoon at 2:40  Thanks,   -Mickel Baas

## 2019-09-24 ENCOUNTER — Telehealth: Payer: Self-pay | Admitting: Family Medicine

## 2019-09-29 ENCOUNTER — Ambulatory Visit (INDEPENDENT_AMBULATORY_CARE_PROVIDER_SITE_OTHER): Payer: Medicare Other | Admitting: Family Medicine

## 2019-09-29 ENCOUNTER — Other Ambulatory Visit: Payer: Self-pay

## 2019-09-29 ENCOUNTER — Telehealth: Payer: Self-pay | Admitting: Family Medicine

## 2019-09-29 ENCOUNTER — Encounter: Payer: Self-pay | Admitting: Family Medicine

## 2019-09-29 VITALS — BP 138/72 | HR 82 | Temp 96.9°F | Wt 119.6 lb

## 2019-09-29 DIAGNOSIS — L03116 Cellulitis of left lower limb: Secondary | ICD-10-CM

## 2019-09-29 DIAGNOSIS — K8020 Calculus of gallbladder without cholecystitis without obstruction: Secondary | ICD-10-CM

## 2019-09-29 DIAGNOSIS — R531 Weakness: Secondary | ICD-10-CM | POA: Diagnosis not present

## 2019-09-29 DIAGNOSIS — F4322 Adjustment disorder with anxiety: Secondary | ICD-10-CM | POA: Diagnosis not present

## 2019-09-29 MED ORDER — DOXYCYCLINE HYCLATE 100 MG PO TABS: 100.0000 mg | ORAL_TABLET | Freq: Two times a day (BID) | ORAL | 0 refills | Status: AC

## 2019-09-29 MED ORDER — SERTRALINE HCL 50 MG PO TABS: 50.0000 mg | ORAL_TABLET | Freq: Every day | ORAL | 3 refills | Status: DC

## 2019-09-29 MED ORDER — HYDROXYZINE HCL 10 MG PO TABS: 10.0000 mg | ORAL_TABLET | Freq: Three times a day (TID) | ORAL | 0 refills | Status: DC | PRN

## 2019-09-29 MED ORDER — ONDANSETRON HCL 4 MG PO TABS: 4.0000 mg | ORAL_TABLET | Freq: Three times a day (TID) | ORAL | 0 refills | Status: DC | PRN

## 2019-09-29 NOTE — Patient Instructions (Signed)

## 2019-09-29 NOTE — Telephone Encounter (Signed)
Daughter advised. They will come in at 1:40pm.

## 2019-09-29 NOTE — Telephone Encounter (Signed)
Pt is in extreme pain and discomfort and thinks it is Gallstones and anxiety is playing into it as well/ Pt went to [facility] ER and has sat for 7 hrs and has not been seen and would like to see if Dr. B can see her today/ please advise and call her daughter Lovey Newcomer @ [phone]

## 2019-09-29 NOTE — Telephone Encounter (Signed)
OK to double book the 140 appt and have her come in

## 2019-09-29 NOTE — Progress Notes (Signed)
Patient: Rita Webb Female    DOB: [birth year]   74 y.o.   MRN: [id number] Visit Date: 09/29/2019  Today's Provider: Lavon Paganini, MD   Chief Complaint  Patient presents with  . Leg Pain   Subjective:    I, Porsha McClurkin CMA, am acting as a scribe for Lavon Paganini, MD.   Leg Pain  The incident occurred 2 days ago. There was no injury mechanism. The pain is present in the left thigh and left leg. The quality of the pain is described as aching. The pain is at a severity of 4/10. The pain is mild. Associated symptoms include numbness (feet per pt) and tingling. Pertinent negatives include no inability to bear weight. She reports no foreign bodies present. The symptoms are aggravated by movement. She has tried nothing for the symptoms. The treatment provided no relief.  She reports that her left thigh is red. Patient states that she been having anxiety attacks due to finding out she will have gallbladder and kidney surgery in April (4/8). She found out that she had gallstones on September 10, 2019.  No more RUQ pain, but does have nausea with eating.  Anxiety is keeping her awake at night.  Feels very tired and weak.  Trying to build up strength for surgery.   Allergies  Allergen Reactions  . Cefuroxime Diarrhea     Current Outpatient Medications:  .  ipratropium (ATROVENT) 0.06 % nasal spray, , Disp: , Rfl:  .  montelukast (SINGULAIR) 10 MG tablet, Take 10 mg by mouth at bedtime. , Disp: , Rfl:  .  simvastatin (ZOCOR) 10 MG tablet, TAKE ONE TABLET BY MOUTH AT BEDTIME, Disp: 90 tablet, Rfl: 1 .  B COMPLEX-C PO, Take 1 tablet by mouth daily. , Disp: , Rfl:  .  Calcium Carbonate-Vitamin D 600-200 MG-UNIT CAPS, Take 2 capsules by mouth daily. , Disp: , Rfl:  .  Cetirizine HCl 10 MG CAPS, Take 1 capsule by mouth daily. , Disp: , Rfl:  .  Cholecalciferol 1000 UNITS capsule, Take 1,000 Units by mouth daily. , Disp: , Rfl:  .  Ibuprofen 200 MG CAPS, Take 1 capsule  by mouth as needed. , Disp: , Rfl:  .  MULTIPLE VITAMIN PO, Take 1 tablet by mouth daily. , Disp: , Rfl:  .  Omega-3 Fatty Acids (FISH OIL) 1200 MG CAPS, Take 1 capsule by mouth daily. , Disp: , Rfl:  .  oxymetazoline (MUCINEX NASAL SPRAY FULL FORCE) 0.05 % nasal spray, Place 1 spray into both nostrils 2 (two) times daily., Disp: , Rfl:  .  psyllium (REGULOID) 0.52 G capsule, Take 1 capsule by mouth 6 (six) times daily. , Disp: , Rfl:  .  UNABLE TO FIND, Med Name: Allergy Shots once a week, Disp: , Rfl:   Review of Systems  Constitutional: Negative.   Respiratory: Negative.   Cardiovascular: Negative.   Genitourinary: Negative.   Musculoskeletal: Negative.   Neurological: Positive for tingling and numbness (feet per pt).    Social History   Tobacco Use  . Smoking status: Former Smoker    Quit date: 11/10/1968    Years since quitting: 50.9  . Smokeless tobacco: Never Used  Substance Use Topics  . Alcohol use: No      Objective:   BP 138/72 (BP Location: Right Arm, Patient Position: Sitting, Cuff Size: Normal)   Pulse 82   Temp (!) 96.9 F (36.1 C) (Temporal)   Wt 119 lb  9.6 oz (54.3 kg)   SpO2 97%   BMI 19.90 kg/m  Vitals:   09/29/19 1350  BP: 138/72  Pulse: 82  Temp: (!) 96.9 F (36.1 C)  TempSrc: Temporal  SpO2: 97%  Weight: 119 lb 9.6 oz (54.3 kg)  Body mass index is 19.9 kg/m.   Physical Exam Vitals reviewed.  Constitutional:      General: She is not in acute distress.    Appearance: Normal appearance. She is well-developed. She is not diaphoretic.  HENT:     Head: Normocephalic and atraumatic.  Eyes:     General: No scleral icterus.    Conjunctiva/sclera: Conjunctivae normal.  Neck:     Thyroid: No thyromegaly.  Cardiovascular:     Rate and Rhythm: Normal rate and regular rhythm.     Pulses: Normal pulses.     Heart sounds: Normal heart sounds. No murmur.  Pulmonary:     Effort: Pulmonary effort is normal. No respiratory distress.     Breath  sounds: Normal breath sounds. No wheezing, rhonchi or rales.  Musculoskeletal:     Cervical back: Neck supple.     Right lower leg: No edema.     Left lower leg: No edema.     Comments: No calf tenderness.  Negative Homans' sign  Lymphadenopathy:     Cervical: No cervical adenopathy.  Skin:    General: Skin is warm and dry.     Capillary Refill: Capillary refill takes less than 2 seconds.     Comments: 2 cm circular area of erythema on left medial thigh that is tender to palpation  Neurological:     Mental Status: She is alert and oriented to person, place, and time. Mental status is at baseline.  Psychiatric:        Mood and Affect: Mood is anxious.        Thought Content: Thought content does not include homicidal or suicidal ideation. Thought content does not include homicidal or suicidal plan.      No results found for any visits on 09/29/19.     Assessment & Plan    1. Cellulitis of left lower extremity -New problem -Consistent with cellulitis on exam -No evidence of DVT, reassurance given -Treat with doxycycline twice daily x7 days -Discussed return precautions  2. Adjustment disorder with anxiety -New problem -Related to recent finding of renal mass that is concerning for renal cell carcinoma -Also related to upcoming surgeries -She has never taken a medication for anxiety -Discussed synergistic effects between therapy and medications -Discussed resources for finding a counselor/therapist -Start Zoloft 50 mg daily -Discussed that SSRIs can take 6 to 8 weeks to reach full efficacy -Discussed possible side effects -Hydroxyzine as needed, especially at bedtime while Zoloft is increasing in efficacy -Discussed return precautions -Follow-up in 1 month -Repeat PHQ-9 and GAD-7 at next visit  3. Calculus of gallbladder without cholecystitis without obstruction -Ongoing nausea, likely related to cholelithiasis/biliary colic -Continue low-fat diet -Reviewed labs from  emergency room visit, where she was triaged, but not seen before she left -Labs are unremarkable, including normal WBC and lipase -Zofran as needed -Return precautions discussed -Upcoming cholecystectomy on 4/8   Meds ordered this encounter  Medications  . doxycycline (VIBRA-TABS) 100 MG tablet    Sig: Take 1 tablet (100 mg total) by mouth 2 (two) times daily for 7 days.    Dispense:  14 tablet    Refill:  0  . sertraline (ZOLOFT) 50 MG tablet  Sig: Take 1 tablet (50 mg total) by mouth daily.    Dispense:  30 tablet    Refill:  3  . ondansetron (ZOFRAN) 4 MG tablet    Sig: Take 1 tablet (4 mg total) by mouth every 8 (eight) hours as needed for nausea or vomiting.    Dispense:  20 tablet    Refill:  0  . hydrOXYzine (ATARAX/VISTARIL) 10 MG tablet    Sig: Take 1 tablet (10 mg total) by mouth 3 (three) times daily as needed for anxiety.    Dispense:  30 tablet    Refill:  0     Return in about 4 weeks (around 10/27/2019) for Anxiety f/u.   The entirety of the information documented in the History of Present Illness, Review of Systems and Physical Exam were personally obtained by me. Portions of this information were initially documented by [facility], CMA and reviewed by me for thoroughness and accuracy.    [place], Dionne Bucy, MD MPH [facility]

## 2019-10-01 DIAGNOSIS — C649 Malignant neoplasm of unspecified kidney, except renal pelvis: Secondary | ICD-10-CM | POA: Diagnosis not present

## 2019-10-01 DIAGNOSIS — R918 Other nonspecific abnormal finding of lung field: Secondary | ICD-10-CM | POA: Diagnosis not present

## 2019-10-01 DIAGNOSIS — N2889 Other specified disorders of kidney and ureter: Secondary | ICD-10-CM | POA: Diagnosis not present

## 2019-10-08 ENCOUNTER — Ambulatory Visit (INDEPENDENT_AMBULATORY_CARE_PROVIDER_SITE_OTHER): Payer: Medicare Other | Admitting: Family Medicine

## 2019-10-08 ENCOUNTER — Other Ambulatory Visit: Payer: Self-pay

## 2019-10-08 ENCOUNTER — Ambulatory Visit
Admission: RE | Admit: 2019-10-08 | Discharge: 2019-10-08 | Disposition: A | Discharge status: Home / Self Care | Payer: Medicare Other | Source: Ambulatory Visit | Attending: Family Medicine | Admitting: Family Medicine

## 2019-10-08 ENCOUNTER — Encounter: Payer: Self-pay | Admitting: Family Medicine

## 2019-10-08 VITALS — BP 128/56 | HR 84 | Temp 96.6°F | Wt 119.0 lb

## 2019-10-08 DIAGNOSIS — I82422 Acute embolism and thrombosis of left iliac vein: Secondary | ICD-10-CM | POA: Diagnosis not present

## 2019-10-08 DIAGNOSIS — C649 Malignant neoplasm of unspecified kidney, except renal pelvis: Secondary | ICD-10-CM | POA: Diagnosis not present

## 2019-10-08 DIAGNOSIS — M7062 Trochanteric bursitis, left hip: Secondary | ICD-10-CM

## 2019-10-08 DIAGNOSIS — I8002 Phlebitis and thrombophlebitis of superficial vessels of left lower extremity: Secondary | ICD-10-CM | POA: Insufficient documentation

## 2019-10-08 DIAGNOSIS — F4322 Adjustment disorder with anxiety: Secondary | ICD-10-CM

## 2019-10-08 DIAGNOSIS — N2889 Other specified disorders of kidney and ureter: Secondary | ICD-10-CM | POA: Diagnosis not present

## 2019-10-08 MED ORDER — ENOXAPARIN SODIUM 80 MG/0.8ML ~~LOC~~ SOLN: 1.5000 mg/kg | SUBCUTANEOUS | 0 refills | Status: DC

## 2019-10-08 MED ORDER — CLONAZEPAM 0.5 MG PO TABS: 0.2500 mg | ORAL_TABLET | Freq: Two times a day (BID) | ORAL | 0 refills | Status: DC | PRN

## 2019-10-08 NOTE — Addendum Note (Signed)
Addended by: Virginia Crews on: 10/08/2019 04:59 PM   Modules accepted: Orders

## 2019-10-08 NOTE — Patient Instructions (Signed)
Thrombophlebitis Thrombophlebitis is a condition in which a blood clot forms in a vein. This can happen in your arms or legs, or in the area between your neck and groin (torso). When this condition happens in a vein that is close to the surface of the body (superficial thrombophlebitis), it is usually not serious.However, when the condition happens in a vein that is deep inside the body (deep vein thrombosis, DVT), it can cause serious problems. What are the causes? This condition may be caused by:  Damage to a vein.  Inflammation of the veins.  A condition that causes blood to clot more easily.  Reduced blood flow through the veins. What increases the risk? The following factors may make you more likely to develop this condition:  Having a condition that makes blood thicker or more likely to clot.  Having an infection.  Having major surgery.  Experiencing a traumatic injury or a broken bone.  Having a catheter in a vein (central line).  Having a condition in which valves in the veins do not work properly, causing blood to collect (pool) in the veins (chronic venous insufficiency).  An inactive (sedentary) lifestyle.  Pregnancy or having recently given birth.  Cancer.  Older age, especially being 48 or older.  Obesity.  Smoking.  Taking medicines that contain estrogen, such as birth control pills.  Having varicose veins.  Using drugs that are injected into the veins (intravenous, IV). What are the signs or symptoms? The main symptoms of this condition are:  Swelling and pain in an arm or leg. If the affected vein is in the leg, you may feel pain while standing or walking.  Warmth or redness in an arm or leg. Other symptoms include:  Low-grade fever.  Muscle aches.  A bulging vein (venous distension). In some cases, there are no symptoms. How is this diagnosed? This condition may be diagnosed based on:  Your symptoms and medical history.  A physical  exam.  Tests, such as: ? Blood tests. ? A test that uses sound waves to make images (ultrasound). How is this treated? Treatment depends on how severe the condition is and which area of the body is affected. Treatment may include:  Applying a warm compress or heating pad to affected areas.  Wearing compression stockings to help prevent blood clots and reduce swelling in your legs.  Raising (elevating) the affected arm or leg above the level of your heart.  Medicines, such as: ? Anti-inflammatory medicines, such as ibuprofen. ? Blood thinners (anticoagulants), such as heparin. ? Antibiotic medicine, if you have an infection.  Removing an IV that may be causing the problem. In rare cases, surgery may be needed to:  Remove a damaged section of a vein.  Place a filter in a large vein to catch blood clots before they reach the lungs. Follow these instructions at home: Medicines  Take over-the-counter and prescription medicines only as told by your health care provider.  If you were prescribed an antibiotic, take it as told by your health care provider. Do not stop using the antibiotic even if you feel better. Managing pain, stiffness, and swelling   If directed, put heat on the affected area as often as told by your health care provider. Use the heat source that your health care provider recommends, such as a moist heat pack or a heating pad. ? Place a towel between your skin and the heat source. ? Leave the heat on for 20-30 minutes. ? Remove the heat  if your skin turns bright red. This is especially important if you are not able to feel pain, heat, or cold. You may have a greater risk of getting burned.  Elevate the affected area above the level of your heart while you are sitting or lying down. Activity  Return to your normal activities as told by your health care provider. Ask your health care provider what activities are safe for you.  Avoid sitting or lying down for long  periods. If possible, stand up and walk around regularly. If you are taking blood thinners:  Take your medicine exactly as told, at the same time every day.  Avoid activities that could cause injury or bruising, and follow instructions about how to prevent falls.  Wear a medical alert bracelet or carry a card that lists what medicines you take. General instructions  Drink enough fluid to keep your urine pale yellow.  Wear compression stockings as told by your health care provider.  Do not use any products that contain nicotine or tobacco, such as cigarettes and e-cigarettes. If you need help quitting, ask your health care provider.  Keep all follow-up visits as told by your health care provider. This is important. Contact a health care provider if:  You miss a dose of your blood thinner, if applicable.  Your symptoms do not improve.  You have unusual bruising.  You have nausea, vomiting, or diarrhea that lasts for more than one day. Get help right away if:  You have any of these problems: ? New or worse pain, swelling, or redness in an arm or leg. ? Numbness or tingling in an arm or leg. ? Shortness of breath. ? Chest pain. ? Severe pain in your abdomen. ? Fast breathing. ? A fast or irregular heartbeat. ? Blood in your vomit, stool, or urine. ? A severe headache or confusion. ? A cut that does not stop bleeding.  You feel light-headed or dizzy.  You cough up blood.  You have a serious fall or accident, or you hit your head. These symptoms may represent a serious problem that is an emergency. Do not wait to see if the symptoms will go away. Get medical help right away. Call your local emergency services (911 in the [place] Do not drive yourself to the hospital. Summary  Thrombophlebitis is a condition in which a blood clot forms in a vein. This can happen in a vein close to the surface of the body or a vein deep inside the body.  This condition can cause serious  problems when it happens in a vein deep inside the body (deep vein thrombosis, DVT).  The main symptom of this condition is swelling and pain around the affected vein.  Treatment may include warm compresses, anti-inflammatory medicines, or blood thinners. This information is not intended to replace advice given to you by your health care provider. Make sure you discuss any questions you have with your health care provider. Document Revised: 10/14/2018 Document Reviewed: 12/18/2016 Elsevier Patient Education  [place].

## 2019-10-08 NOTE — Progress Notes (Addendum)
Established patient visit   Patient: Rita Webb   DOB: 1945-12-10   74 y.o. Female  MRN: [id number] Visit Date: 10/08/2019  Today's healthcare provider: Lavon Paganini, MD  Subjective:    Chief Complaint  Patient presents with  . Leg Pain    Left leg   HPI  L medial thigh erythema and pain resolved with antibiotic therapy.  Now with small area of erythema, tenderness, and palpable nodule noted on medial lower leg just distal to the left knee.  She has known varicose veins.  She has had no calf pain/tenderness or difficulty walking  Left hip pain present for 1 day.  Hurts worse to lay on her left side and to walk.  She has been more sedentary than usual with her recent illnesses and cholelithiasis/biliary colic.  She is never had anything like this before.  The pain is in her lateral left hip and radiates down her thigh.  No back pain.  No weakness/numbness.  She has not tried any treatments  Anxiety: Feels like the medication is mostly helping.  She has gotten dry mouth and urinary issues overnight since starting the hydroxyzine.  She is taking this consistently 3 times daily.  She is also taking Zoloft daily without issue.  Patient Active Problem List   Diagnosis Date Noted  . Renal mass, right 09/13/2019  . Calculus of gallbladder without cholecystitis without obstruction 09/13/2019  . Family history of breast cancer in mother 02/13/2017  . Osteoporosis 02/27/2015  . Allergic rhinitis 11/11/2014  . Hypercholesteremia 11/11/2014  . History of malignant melanoma of skin 11/11/2014  . Avitaminosis D 11/11/2014   Past Medical History:  Diagnosis Date  . Allergy   . Hyperlipidemia    Past Surgical History:  Procedure Laterality Date  . GANGLION CYST EXCISION Right 1996   wrist  . MELANOMA EXCISION  08/2011  . SPINE SURGERY     Social History   Tobacco Use  . Smoking status: Former Smoker    Quit date: 11/10/1968    Years since quitting: 50.9  . Smokeless  tobacco: Never Used  Substance Use Topics  . Alcohol use: No  . Drug use: No       Medications: Outpatient Medications Prior to Visit  Medication Sig  . B COMPLEX-C PO Take 1 tablet by mouth daily.   . Calcium Carbonate-Vitamin D 600-200 MG-UNIT CAPS Take 2 capsules by mouth daily.   . Cetirizine HCl 10 MG CAPS Take 1 capsule by mouth daily.   . Cholecalciferol 1000 UNITS capsule Take 1,000 Units by mouth daily.   . hydrOXYzine (ATARAX/VISTARIL) 10 MG tablet Take 1 tablet (10 mg total) by mouth 3 (three) times daily as needed for anxiety.  . Ibuprofen 200 MG CAPS Take 1 capsule by mouth as needed.   Marland Kitchen ipratropium (ATROVENT) 0.06 % nasal spray   . montelukast (SINGULAIR) 10 MG tablet Take 10 mg by mouth at bedtime.   . MULTIPLE VITAMIN PO Take 1 tablet by mouth daily.   . Omega-3 Fatty Acids (FISH OIL) 1200 MG CAPS Take 1 capsule by mouth daily.   . ondansetron (ZOFRAN) 4 MG tablet Take 1 tablet (4 mg total) by mouth every 8 (eight) hours as needed for nausea or vomiting.  Marland Kitchen oxymetazoline (MUCINEX NASAL SPRAY FULL FORCE) 0.05 % nasal spray Place 1 spray into both nostrils 2 (two) times daily.  . psyllium (REGULOID) 0.52 G capsule Take 1 capsule by mouth 6 (six) times daily.   Marland Kitchen  sertraline (ZOLOFT) 50 MG tablet Take 1 tablet (50 mg total) by mouth daily.  . simvastatin (ZOCOR) 10 MG tablet TAKE ONE TABLET BY MOUTH AT BEDTIME  . UNABLE TO FIND Med Name: Allergy Shots once a week   No facility-administered medications prior to visit.    Review of Systems  Constitutional: Positive for chills and fatigue. Negative for activity change, appetite change, diaphoresis, fever and unexpected weight change.  Musculoskeletal: Negative for arthralgias, back pain, gait problem, joint swelling, myalgias, neck pain and neck stiffness.    Last CBC Lab Results  Component Value Date   WBC 6.6 02/18/2019   HGB 13.1 02/18/2019   HCT 39.4 02/18/2019   MCV 91 02/18/2019   MCH 30.2 02/18/2019   RDW  12.7 02/18/2019   PLT 287 37/62/8315   Last metabolic panel Lab Results  Component Value Date   GLUCOSE 115 (H) 02/18/2019   NA 143 02/18/2019   K 4.3 02/18/2019   CL 106 02/18/2019   CO2 21 02/18/2019   BUN 14 02/18/2019   CREATININE 0.84 02/18/2019   GFRNONAA 70 02/18/2019   GFRAA 81 02/18/2019   CALCIUM 9.2 02/18/2019   PROT 7.1 09/15/2019   ALBUMIN 4.8 (H) 09/15/2019   LABGLOB 2.0 02/18/2019   AGRATIO 2.3 (H) 02/18/2019   BILITOT 0.4 09/15/2019   ALKPHOS 97 09/15/2019   AST 24 09/15/2019   ALT 61 (H) 09/15/2019        Objective:    BP (!) 128/56 (BP Location: Left Arm, Patient Position: Sitting, Cuff Size: Normal)   Pulse 84   Temp (!) 96.6 F (35.9 C) (Temporal)   Wt 119 lb (54 kg)   BMI 19.80 kg/m  Wt Readings from Last 3 Encounters:  10/08/19 119 lb (54 kg)  09/29/19 119 lb 9.6 oz (54.3 kg)  09/15/19 121 lb (54.9 kg)      Physical Exam Vitals reviewed.  Constitutional:      General: She is not in acute distress.    Appearance: She is well-developed.  HENT:     Head: Normocephalic and atraumatic.  Eyes:     General: No scleral icterus.    Conjunctiva/sclera: Conjunctivae normal.  Cardiovascular:     Rate and Rhythm: Normal rate and regular rhythm.  Pulmonary:     Effort: Pulmonary effort is normal. No respiratory distress.  Musculoskeletal:     Right lower leg: No edema.     Left lower leg: No edema.     Comments: Left hip: Range of motion within normal limits.  Strength intact in flexion, abduction, adduction.  No pain with internal/external rotation.  Tenderness to palpation over greater trochanter and down IT band No calf tenderness or palpable cords Negative Homans' sign Palpable nodule over varicose vein with tenderness and erythema just distal to the medial left knee  Skin:    General: Skin is warm and dry.  Neurological:     Mental Status: She is alert and oriented to person, place, and time. Mental status is at baseline.  Psychiatric:         Behavior: Behavior normal.       No results found for any visits on 10/08/19.    Assessment & Plan:    1. 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 Adjustment disorder with anxiety -Improving/stable -Doing well on Zoloft, which we will continue current dose -Having anticholinergic side effects from hydroxyzine, which we will discontinue -Can use very low-dose clonazepam as needed for acute anxiety symptoms -Discussed possible risks of these, especially in the elderly -Likely a short-term medication while undergoing current work-up for possible cancer  4. Greater trochanteric bursitis of left hip -New problem -Exam is consistent with trochanteric bursitis of the left hip -This is likely related to being more sedentary recently than previously -Avoid NSAIDs given upcoming nephrectomy -Can use Tylenol as needed -Advised on Biofreeze/Aspercreme for topical relief -Home exercise program given -Advised to ice the area -Return precautions discussed  Meds ordered this encounter  Medications  . clonazePAM (KLONOPIN) 0.5 MG tablet    Sig: Take 0.5-1 tablets (0.25-0.5 mg total) by mouth 2 (two) times daily as needed for anxiety.    Dispense:  30 tablet    Refill:  0     Return if symptoms worsen or fail to improve.   The entirety of the  information documented in the History of Present Illness, Review of Systems and Physical Exam were personally obtained by me. Portions of this information were initially documented by Ashley Royalty, CMA and reviewed by me for thoroughness and accuracy.    Virginia Crews, MD MPH [facility]     Addendum:  LE Duplex shows extensive deep and superficial clots of the LLE.  Gave results to daughter, Joseph Art.  Discussed need for anticoagulation.  Given upcoming surgery, will start Lovenox 1.5mg /kg daily and then consider DOAC post-op.  Left message for Dr Voncille Lo at [facility].

## 2019-10-11 ENCOUNTER — Telehealth: Payer: Self-pay

## 2019-10-11 DIAGNOSIS — Z8616 Personal history of COVID-19: Secondary | ICD-10-CM | POA: Insufficient documentation

## 2019-10-11 NOTE — Telephone Encounter (Signed)
Copied from [place] [id number]. Topic: General - Other >> Oct 11, 2019 12:31 PM Leward Quan A wrote: Reason for CRM: Patient called to request a copy of her US done on 10/08/19 sent over to Dr Voncille Lo at [facility] at Fax# [phone] asking for this to be sent ASAP say that she plans to have surgery on 10/14/19. Any questions please call patient at Ph# [phone]

## 2019-10-11 NOTE — Telephone Encounter (Signed)
Copied from [place] [id number]. Topic: General - Other >> Oct 11, 2019  3:43 PM Mcneil, Ja-Kwan wrote: Reason for CRM: Pt daughter Pamala Hurry stated pt surgery has been postponed and she would like to know if an oral blood thinner can be prescribed. Pt daughter requests call back. Cb# [phone]

## 2019-10-11 NOTE — Telephone Encounter (Signed)
Please advise 

## 2019-10-11 NOTE — Telephone Encounter (Signed)
Results faxed.

## 2019-10-11 NOTE — Telephone Encounter (Signed)
Pt's daughter called back in to follow up on previous message sent. Please advise.

## 2019-10-12 DIAGNOSIS — R1011 Right upper quadrant pain: Secondary | ICD-10-CM | POA: Diagnosis not present

## 2019-10-12 DIAGNOSIS — I82422 Acute embolism and thrombosis of left iliac vein: Secondary | ICD-10-CM | POA: Diagnosis not present

## 2019-10-12 DIAGNOSIS — Z01818 Encounter for other preprocedural examination: Secondary | ICD-10-CM | POA: Diagnosis not present

## 2019-10-12 DIAGNOSIS — N2889 Other specified disorders of kidney and ureter: Secondary | ICD-10-CM | POA: Diagnosis not present

## 2019-10-12 DIAGNOSIS — K8020 Calculus of gallbladder without cholecystitis without obstruction: Secondary | ICD-10-CM | POA: Diagnosis not present

## 2019-10-12 DIAGNOSIS — Z8616 Personal history of COVID-19: Secondary | ICD-10-CM | POA: Diagnosis not present

## 2019-10-12 DIAGNOSIS — I82402 Acute embolism and thrombosis of unspecified deep veins of left lower extremity: Secondary | ICD-10-CM | POA: Diagnosis not present

## 2019-10-13 ENCOUNTER — Other Ambulatory Visit: Payer: Self-pay

## 2019-10-13 ENCOUNTER — Encounter: Payer: Self-pay | Admitting: Adult Health

## 2019-10-13 ENCOUNTER — Inpatient Hospital Stay
Admission: EM | Admit: 2019-10-13 | Discharge: 2019-10-15 | DRG: 271 | Disposition: A | Discharge status: Home / Self Care | Payer: Medicare Other | Attending: Family Medicine | Admitting: Family Medicine

## 2019-10-13 ENCOUNTER — Emergency Department: Payer: Medicare Other

## 2019-10-13 ENCOUNTER — Other Ambulatory Visit: Payer: Self-pay | Admitting: Adult Health

## 2019-10-13 DIAGNOSIS — Z20822 Contact with and (suspected) exposure to covid-19: Secondary | ICD-10-CM | POA: Diagnosis present

## 2019-10-13 DIAGNOSIS — Z7901 Long term (current) use of anticoagulants: Secondary | ICD-10-CM | POA: Diagnosis not present

## 2019-10-13 DIAGNOSIS — E785 Hyperlipidemia, unspecified: Secondary | ICD-10-CM | POA: Diagnosis present

## 2019-10-13 DIAGNOSIS — I82409 Acute embolism and thrombosis of unspecified deep veins of unspecified lower extremity: Secondary | ICD-10-CM | POA: Diagnosis present

## 2019-10-13 DIAGNOSIS — I824Y2 Acute embolism and thrombosis of unspecified deep veins of left proximal lower extremity: Secondary | ICD-10-CM | POA: Diagnosis not present

## 2019-10-13 DIAGNOSIS — F419 Anxiety disorder, unspecified: Secondary | ICD-10-CM | POA: Diagnosis not present

## 2019-10-13 DIAGNOSIS — N2889 Other specified disorders of kidney and ureter: Secondary | ICD-10-CM | POA: Diagnosis not present

## 2019-10-13 DIAGNOSIS — N390 Urinary tract infection, site not specified: Secondary | ICD-10-CM | POA: Diagnosis not present

## 2019-10-13 DIAGNOSIS — J309 Allergic rhinitis, unspecified: Secondary | ICD-10-CM | POA: Diagnosis not present

## 2019-10-13 DIAGNOSIS — I82412 Acute embolism and thrombosis of left femoral vein: Secondary | ICD-10-CM | POA: Diagnosis present

## 2019-10-13 DIAGNOSIS — Z881 Allergy status to other antibiotic agents status: Secondary | ICD-10-CM | POA: Diagnosis not present

## 2019-10-13 DIAGNOSIS — K8020 Calculus of gallbladder without cholecystitis without obstruction: Secondary | ICD-10-CM | POA: Diagnosis not present

## 2019-10-13 DIAGNOSIS — Z79899 Other long term (current) drug therapy: Secondary | ICD-10-CM

## 2019-10-13 DIAGNOSIS — Z8616 Personal history of COVID-19: Secondary | ICD-10-CM

## 2019-10-13 DIAGNOSIS — Z85820 Personal history of malignant melanoma of skin: Secondary | ICD-10-CM | POA: Diagnosis not present

## 2019-10-13 DIAGNOSIS — Z87891 Personal history of nicotine dependence: Secondary | ICD-10-CM | POA: Diagnosis not present

## 2019-10-13 DIAGNOSIS — I82432 Acute embolism and thrombosis of left popliteal vein: Secondary | ICD-10-CM | POA: Diagnosis present

## 2019-10-13 DIAGNOSIS — I82422 Acute embolism and thrombosis of left iliac vein: Secondary | ICD-10-CM

## 2019-10-13 DIAGNOSIS — F329 Major depressive disorder, single episode, unspecified: Secondary | ICD-10-CM | POA: Diagnosis not present

## 2019-10-13 DIAGNOSIS — M79605 Pain in left leg: Secondary | ICD-10-CM

## 2019-10-13 DIAGNOSIS — I82402 Acute embolism and thrombosis of unspecified deep veins of left lower extremity: Secondary | ICD-10-CM | POA: Diagnosis not present

## 2019-10-13 DIAGNOSIS — M7989 Other specified soft tissue disorders: Secondary | ICD-10-CM

## 2019-10-13 LAB — CBC
HCT: 29.6 % — ABNORMAL LOW (ref 36.0–46.0)
Hemoglobin: 10 g/dL — ABNORMAL LOW (ref 12.0–15.0)
MCH: 30.7 pg (ref 26.0–34.0)
MCHC: 33.8 g/dL (ref 30.0–36.0)
MCV: 90.8 fL (ref 80.0–100.0)
Platelets: 367 10*3/uL (ref 150–400)
RBC: 3.26 MIL/uL — ABNORMAL LOW (ref 3.87–5.11)
RDW: 13.2 % (ref 11.5–15.5)
WBC: 12.7 10*3/uL — ABNORMAL HIGH (ref 4.0–10.5)
nRBC: 0 % (ref 0.0–0.2)

## 2019-10-13 LAB — COMPREHENSIVE METABOLIC PANEL
ALT: 40 U/L (ref 0–44)
AST: 25 U/L (ref 15–41)
Albumin: 2.8 g/dL — ABNORMAL LOW (ref 3.5–5.0)
Alkaline Phosphatase: 84 U/L (ref 38–126)
Anion gap: 9 (ref 5–15)
BUN: 12 mg/dL (ref 8–23)
CO2: 25 mmol/L (ref 22–32)
Calcium: 8.4 mg/dL — ABNORMAL LOW (ref 8.9–10.3)
Chloride: 98 mmol/L (ref 98–111)
Creatinine, Ser: 0.51 mg/dL (ref 0.44–1.00)
GFR calc Af Amer: >60 mL/min (ref >60)
GFR calc non Af Amer: >60 mL/min (ref >60)
Glucose, Bld: 123 mg/dL — ABNORMAL HIGH (ref 70–99)
Potassium: 3.8 mmol/L (ref 3.5–5.1)
Sodium: 132 mmol/L — ABNORMAL LOW (ref 135–145)
Total Bilirubin: 0.6 mg/dL (ref 0.3–1.2)
Total Protein: 6.4 g/dL — ABNORMAL LOW (ref 6.5–8.1)

## 2019-10-13 MED ORDER — ONDANSETRON HCL 4 MG/2ML IJ SOLN
4.0000 mg | Freq: Once | INTRAMUSCULAR | Status: AC
  Administered 2019-10-13: 4 mg via INTRAVENOUS
  Filled 2019-10-13: qty 2

## 2019-10-13 MED ORDER — MORPHINE SULFATE (PF) 2 MG/ML IV SOLN
2.0000 mg | Freq: Once | INTRAVENOUS | Status: AC
  Administered 2019-10-13: 2 mg via INTRAVENOUS
  Filled 2019-10-13: qty 1

## 2019-10-13 MED ORDER — APIXABAN 5 MG PO TABS: ORAL_TABLET | ORAL | 0 refills | Status: DC

## 2019-10-13 MED ORDER — HEPARIN (PORCINE) 25000 UT/250ML-% IV SOLN
1200.0000 [IU]/h | INTRAVENOUS | Status: AC
  Administered 2019-10-14: 950 [IU]/h via INTRAVENOUS
  Administered 2019-10-15: 1100 [IU]/h via INTRAVENOUS
  Filled 2019-10-13 (×2): qty 250

## 2019-10-13 MED ORDER — HEPARIN SODIUM (PORCINE) 5000 UNIT/ML IJ SOLN: 60.0000 [IU]/kg | Freq: Once | INTRAMUSCULAR | Status: DC

## 2019-10-13 MED ORDER — HEPARIN (PORCINE) 25000 UT/250ML-% IV SOLN: 16.0000 [IU]/kg/h | INTRAVENOUS | Status: DC

## 2019-10-13 MED ORDER — MORPHINE SULFATE (PF) 2 MG/ML IV SOLN: 1.0000 mg | Freq: Once | INTRAVENOUS | Status: DC

## 2019-10-13 NOTE — Telephone Encounter (Signed)
I have placed urgent referral as well to [facility] - can you see soonest they can see patient please ?

## 2019-10-13 NOTE — ED Provider Notes (Addendum)
[facility] Emergency Department Provider Note  ____________________________________________   First MD Initiated Contact with Patient 10/13/19 2310     (approximate)  I have reviewed the triage vital signs and the nursing notes.   HISTORY  Chief Complaint Leg Pain and Leg Swelling    HPI Rita Webb is a 74 y.o. female with below list of previous medical conditions including recently diagnosed left lower extremity DVT presents to the emergency department secondary to worsening left lower extremity pain and swelling her current pain score of 8 out of 10.  Patient was  seen by primary care provider who ordered an ultrasound which confirmed the presence of a DVT on Friday.  Patient states that she was prescribed Lovenox of which she has been compliant with.  Patient states despite doing so pain has worsened as well as swelling.  Patient denies any chest pain no shortness of breath or palpitations.  Patient denies any dizziness or syncopal episode.     Past Medical History:  Diagnosis Date  . Allergy   . Hyperlipidemia     Patient Active Problem List   Diagnosis Date Noted  . History of 2019 novel coronavirus disease (COVID-19) 10/11/2019  . Renal mass, right 09/13/2019  . Calculus of gallbladder without cholecystitis without obstruction 09/13/2019  . Family history of breast cancer in mother 02/13/2017  . Osteoporosis 02/27/2015  . Allergic rhinitis 11/11/2014  . Hypercholesteremia 11/11/2014  . History of malignant melanoma of skin 11/11/2014  . Avitaminosis D 11/11/2014    Past Surgical History:  Procedure Laterality Date  . GANGLION CYST EXCISION Right 1996   wrist  . MELANOMA EXCISION  08/2011  . SPINE SURGERY      Prior to Admission medications   Medication Sig Start Date End Date Taking? Authorizing Provider  apixaban (ELIQUIS) 5 MG TABS tablet Take 2 tablets (10 mg total) by mouth 2 (two) times daily for 7 days, THEN 1  tablet (5 mg total) 2 (two) times daily. 10/13/19 12/19/19  Flinchum, Kelby Aline, FNP  B COMPLEX-C PO Take 1 tablet by mouth daily.  08/23/10   [provider]  Calcium Carbonate-Vitamin D 600-200 MG-UNIT CAPS Take 2 capsules by mouth daily.  08/23/10   [provider]  Cetirizine HCl 10 MG CAPS Take 1 capsule by mouth daily.     [provider]  Cholecalciferol 1000 UNITS capsule Take 1,000 Units by mouth daily.  08/23/10   [provider]  clonazePAM (KLONOPIN) 0.5 MG tablet Take 0.5-1 tablets (0.25-0.5 mg total) by mouth 2 (two) times daily as needed for anxiety. 10/08/19   Virginia Crews, MD  enoxaparin (LOVENOX) 80 MG/0.8ML injection Inject 0.8 mLs (80 mg total) into the skin daily for 7 days. 10/08/19 10/15/19  Virginia Crews, MD  ipratropium (ATROVENT) 0.06 % nasal spray  02/16/19   [provider]  montelukast (SINGULAIR) 10 MG tablet Take 10 mg by mouth at bedtime.  11/27/11   [provider]  MULTIPLE VITAMIN PO Take 1 tablet by mouth daily.  08/23/10   [provider]  Omega-3 Fatty Acids (FISH OIL) 1200 MG CAPS Take 1 capsule by mouth daily.  08/23/10   [provider]  ondansetron (ZOFRAN) 4 MG tablet Take 1 tablet (4 mg total) by mouth every 8 (eight) hours as needed for nausea or vomiting. 09/29/19   Bacigalupo, Dionne Bucy, MD  oxymetazoline [facility] NASAL SPRAY FULL FORCE) 0.05 % nasal spray Place 1 spray into both  nostrils 2 (two) times daily.    [provider]  psyllium (REGULOID) 0.52 G capsule Take 1 capsule by mouth 6 (six) times daily.  08/23/10   [provider]  sertraline (ZOLOFT) 50 MG tablet Take 1 tablet (50 mg total) by mouth daily. 09/29/19   Virginia Crews, MD  simvastatin (ZOCOR) 10 MG tablet TAKE ONE TABLET BY MOUTH AT BEDTIME 07/26/19   Bacigalupo, Dionne Bucy, MD  UNABLE TO FIND Med Name: Allergy Shots once a week    Beverly Gust, MD    Allergies Cefuroxime  Family History    Problem Relation Age of Onset  . Breast cancer Mother 91  . Prostate cancer Father   . Diabetes Maternal Aunt   . Diabetes Maternal Uncle     Social History Social History   Tobacco Use  . Smoking status: Former Smoker    Quit date: 11/10/1968    Years since quitting: 50.9  . Smokeless tobacco: Never Used  Substance Use Topics  . Alcohol use: No  . Drug use: No    Review of Systems Constitutional: No fever/chills Eyes: No visual changes. ENT: No sore throat. Cardiovascular: Denies chest pain. Respiratory: Denies shortness of breath. Gastrointestinal: No abdominal pain.  No nausea, no vomiting.  No diarrhea.  No constipation. Genitourinary: Negative for dysuria. Musculoskeletal: Negative for neck pain.  Negative for back pain.  Positive for left lower extremity pain and swelling  integumentary: Negative for rash. Neurological: Negative for headaches, focal weakness or numbness.   ____________________________________________   PHYSICAL EXAM:  VITAL SIGNS: ED Triage Vitals  Enc Vitals Group     BP 10/13/19 1856 (!) 121/44     Pulse Rate 10/13/19 1856 88     Resp 10/13/19 1856 16     Temp 10/13/19 1856 99.6 F (37.6 C)     Temp Source 10/13/19 1856 Oral     SpO2 10/13/19 1856 96 %     Weight 10/13/19 1857 54 kg (119 lb)     Height 10/13/19 1857 1.651 m (5\' 5" )     Head Circumference --      Peak Flow --      Pain Score 10/13/19 1903 9     Pain Loc --      Pain Edu? --      Excl. in [place]? --     Constitutional: Alert and oriented.  Eyes: Conjunctivae are normal.  Mouth/Throat: Patient is wearing a mask. Neck: No stridor.  No meningeal signs.   Cardiovascular: Normal rate, regular rhythm. Good peripheral circulation. Grossly normal heart sounds.Equally palpable PT/DP bilaterally Respiratory: Normal respiratory effort.  No retractions. Gastrointestinal: Soft and nontender. No distention.  Musculoskeletal: No lower extremity tenderness nor edema. No gross  deformities of extremities. Neurologic:  Normal speech and language. No gross focal neurologic deficits are appreciated.  Skin:  Skin is warm, dry and intact. Psychiatric: Mood and affect are normal. Speech and behavior are normal.  ____________________________________________   LABS (all labs ordered are listed, but only abnormal results are displayed)  Labs Reviewed  CBC - Abnormal; Notable for the following components:      Result Value   WBC 12.7 (*)    RBC 3.26 (*)    Hemoglobin 10.0 (*)    HCT 29.6 (*)    All other components within normal limits  COMPREHENSIVE METABOLIC PANEL - Abnormal; Notable for the following components:   Sodium 132 (*)    Glucose, Bld 123 (*)    Calcium  8.4 (*)    Total Protein 6.4 (*)    Albumin 2.8 (*)    All other components within normal limits     RADIOLOGY I, Dennis N Lynnda Wiersma, personally viewed and evaluated these images (plain radiographs) as part of my medical decision making, as well as reviewing the written report by the radiologist.  ED MD interpretation: Extensive occlusive left lower extremity DVT with proximal extension into the common femoral vein overall progression of clot burden since prior ultrasound.  Official radiology report(s): US Venous Img Lower Unilateral Left (DVT)  Result Date: 10/13/2019 CLINICAL DATA:  74 year old female with left lower extremity pain and swelling x1 day. Left lower extremity DVT seen on the recent ultrasound. EXAM: Left LOWER EXTREMITY VENOUS DOPPLER ULTRASOUND TECHNIQUE: Gray-scale sonography with graded compression, as well as color Doppler and duplex ultrasound were performed to evaluate the lower extremity deep venous systems from the level of the common femoral vein and including the common femoral, femoral, profunda femoral, popliteal and calf veins including the posterior tibial, peroneal and gastrocnemius veins when visible. The superficial great saphenous vein was also interrogated. Spectral  Doppler was utilized to evaluate flow at rest and with distal augmentation maneuvers in the common femoral, femoral and popliteal veins. COMPARISON:  Ultrasound dated 10/08/2019. FINDINGS: Contralateral Common Femoral Vein: Respiratory phasicity is normal and symmetric with the symptomatic side. No evidence of thrombus. Normal compressibility. Common Femoral Vein: Occlusive thrombus with distension and noncompression of the vessel. Saphenofemoral Junction: Occlusive thrombus with none compression. Profunda Femoral Vein: Occlusive thrombus with noncompression. Femoral Vein: Occlusive thrombus.  No flow or compression. Popliteal Vein: Occlusive thrombus with distension of the vessel and noncompression. Calf Veins: There is near complete occlusion of the peroneal vein. The posterior tibial vein appears patent. Superficial Great Saphenous Vein: Near complete occlusive thrombus through the great saphenous vein at the thigh. The visualized great saphenous vein is patent at the level of the knee. Venous Reflux:  None. Other Findings:  None. IMPRESSION: Extensive occlusive left lower extremity DVT with proximal extension to the common femoral vein. Overall progression of the clot burden since the prior ultrasound. These results were called by telephone at the time of interpretation on 10/13/2019 at 8:33 pm to provider Merlyn Lot , who verbally acknowledged these results. Electronically Signed   By: Anner Crete M.D.   On: 10/13/2019 20:38    ____________________________________________   PROCEDURES    Procedures   ____________________________________________   INITIAL IMPRESSION / MDM / ASSESSMENT AND PLAN / ED COURSE  As part of my medical decision making, I reviewed the following data within the electronic MEDICAL RECORD [id number] year old female presented with above-stated history and physical exam secondary to progression of left lower extremity DVT now extending into the common femoral vein despite  Lovenox use at home.  Patient without any symptoms of pulmonary emboli.  Patient given heparin bolus followed by heparin infusion.  Patient also given 2 mg of IV morphine on on reevaluation patient states that pain is much improved.  Patient discussed with Dr. Danella Penton for hospital admission for further evaluation and management.   ____________________________________________  FINAL CLINICAL IMPRESSION(S) / ED DIAGNOSES  Final diagnoses:  DVT (deep venous thrombosis) (HCC)  Acute deep vein thrombosis (DVT) of proximal vein of left lower extremity ([place])     MEDICATIONS GIVEN DURING THIS VISIT:  Medications  ondansetron (ZOFRAN) injection 4 mg (has no administration in time range)  morphine 2 MG/ML injection 2 mg (has no administration in time range)  ED Discharge Orders    None      *Please note:  Rita Webb was evaluated in Emergency Department on 10/13/2019 for the symptoms described in the history of present illness. She was evaluated in the context of the global COVID-19 pandemic, which necessitated consideration that the patient might be at risk for infection with the SARS-CoV-2 virus that causes COVID-19. Institutional protocols and algorithms that pertain to the evaluation of patients at risk for COVID-19 are in a state of rapid change based on information released by regulatory bodies including the CDC and federal and state organizations. These policies and algorithms were followed during the patient's care in the ED.  Some ED evaluations and interventions may be delayed as a result of limited staffing during the pandemic.*  Note:  This document was prepared using Dragon voice recognition software and may include unintentional dictation errors.   Gregor Hams, MD 10/14/19 0019    Gregor Hams, MD 10/14/19 [id number]

## 2019-10-13 NOTE — Telephone Encounter (Signed)
Can you have someone look at this. She needs Eliquis

## 2019-10-13 NOTE — Telephone Encounter (Signed)
She was positive for DVT of legit iliac vein. She will need Eliquis  however if symptoms are worsening in the left leg she needs to be evaluated. Any shortness of breath ? Chest discomfort ?  Increasing pain ? Increased erythema to leg? Color change to skin ?    She will need evaluation in the emergency room now for urgent work up if any of the above are yes answers. Ok to advise as such.   She is currently on Lovenox  Reviewed.  IMPRESSION:  1. Deep venous thrombosis extending from the distal aspect of the  left external iliac vein through the level of the common femoral  vein at the saphenofemoral junction.  2. Superficial thrombosis of the left greater saphenous vein from  the proximal through the distal thigh.  3. Patent calf veins, though superficial varicosities are identified  at the site of palpable abnormality.    Electronically Signed    By: Randa Ngo M.D.    On: 10/08/2019 16:19

## 2019-10-13 NOTE — Telephone Encounter (Signed)
Just wanted to verify  and have documented that the patient was not having any of the symptoms asked in the triage message below sent by me earlier.? If you will also please inquire on the swelling in left lower extremity to be sure it is mild and not worsening. Thank you !  Office visit is always appropriate if daughter or patinet has increasing concerns.   Eliquis was sent to pharmacy she is to to take 10 mg by mouth twice daily for 7 days then decrease dose to 5 mg by mouth twice daily usually for around 6 months, however this will be determined by her PCP and specialty doctors.  Finish Lovenox doses completely and then when completed start Eliquis at the next time the scheduled Lovenox would have been due to given.   Avoid any NSAID's such as aleve, ibuprofen. Inform all providers you are on Eliquis. Report any bleeding, or falls.

## 2019-10-13 NOTE — Telephone Encounter (Signed)
Patient's daughter called in in regards to past message. Patient's daughter now notices that L leg is starting to swell and wondering what to do medication wise, as she only has 2 doses left. Please advise.

## 2019-10-13 NOTE — ED Triage Notes (Signed)
First Nurse Note:  C/O fever and left leg pain.  Patient has known DVT to left leg and started taking Lovenox on Friday.  AAOx3.  Skin warm and dry. NAD

## 2019-10-13 NOTE — Telephone Encounter (Signed)
Rita Webb, can you review this and if we need to get her in let me know.  Per Dr B she will need Eliquis.  But as you can see she has called again with additional information Thanks

## 2019-10-13 NOTE — Telephone Encounter (Signed)
Spoke with patient's daughter. She states patient started swelling yesterday morning the entire left extremity. The swelling is unchanged today not worse.   Daughter denies any SOB, chest discomfort, increased erythema to leg, and skin color changes.   Patient has a appointment with vein & vascular tomorrow morning at 11.  Patient's daughter verbalized understanding that if patient develops any of the symptoms above or swelling worsens go to to the ER.

## 2019-10-13 NOTE — Progress Notes (Signed)
Acute deep vein thrombosis (DVT) of iliac vein of left lower extremity (Rita Webb) - Plan: Ambulatory referral to Vascular Surgery  Renal mass, right - Plan: Ambulatory referral to Vascular Surgery  Personal history of covid-19 - Plan: Ambulatory referral to Vascular Surgery

## 2019-10-13 NOTE — Telephone Encounter (Signed)
Patient's daughter Joseph Art advised. Pharmacy- [place] for [facility]

## 2019-10-13 NOTE — ED Triage Notes (Signed)
Pt arrives to ED via POV from home with c/o left leg pain and swelling r/t confirmed DVT through outpatient imaging last Friday. Pt was started on Lovenox and has been taking medication injections as directed. Pt has an appointment with [facility] tomorrow, but presents here tonight for increased pain and swelling. Pt denies any c/o CP or SHOB. Pt is A&O, in NAD; RR even, regular, and unlabored.

## 2019-10-14 ENCOUNTER — Telehealth: Payer: Self-pay

## 2019-10-14 ENCOUNTER — Encounter: Payer: Self-pay | Admitting: Family Medicine

## 2019-10-14 ENCOUNTER — Encounter (INDEPENDENT_AMBULATORY_CARE_PROVIDER_SITE_OTHER): Payer: Self-pay | Admitting: Vascular Surgery

## 2019-10-14 ENCOUNTER — Encounter
Admission: EM | Disposition: A | Discharge status: Home / Self Care | Payer: Self-pay | Source: Home / Self Care | Attending: Family Medicine

## 2019-10-14 ENCOUNTER — Other Ambulatory Visit: Payer: Self-pay

## 2019-10-14 ENCOUNTER — Other Ambulatory Visit (INDEPENDENT_AMBULATORY_CARE_PROVIDER_SITE_OTHER): Payer: Self-pay | Admitting: Vascular Surgery

## 2019-10-14 DIAGNOSIS — I82402 Acute embolism and thrombosis of unspecified deep veins of left lower extremity: Secondary | ICD-10-CM

## 2019-10-14 DIAGNOSIS — I82409 Acute embolism and thrombosis of unspecified deep veins of unspecified lower extremity: Secondary | ICD-10-CM

## 2019-10-14 DIAGNOSIS — I824Y2 Acute embolism and thrombosis of unspecified deep veins of left proximal lower extremity: Secondary | ICD-10-CM

## 2019-10-14 DIAGNOSIS — I82422 Acute embolism and thrombosis of left iliac vein: Secondary | ICD-10-CM

## 2019-10-14 DIAGNOSIS — M79605 Pain in left leg: Secondary | ICD-10-CM

## 2019-10-14 DIAGNOSIS — I82412 Acute embolism and thrombosis of left femoral vein: Secondary | ICD-10-CM

## 2019-10-14 DIAGNOSIS — R600 Localized edema: Secondary | ICD-10-CM

## 2019-10-14 HISTORY — PX: PERIPHERAL VASCULAR THROMBECTOMY: CATH118306

## 2019-10-14 LAB — BASIC METABOLIC PANEL
Anion gap: 6 (ref 5–15)
BUN: 13 mg/dL (ref 8–23)
CO2: 27 mmol/L (ref 22–32)
Calcium: 8.2 mg/dL — ABNORMAL LOW (ref 8.9–10.3)
Chloride: 99 mmol/L (ref 98–111)
Creatinine, Ser: 0.45 mg/dL (ref 0.44–1.00)
GFR calc Af Amer: >60 mL/min (ref >60)
GFR calc non Af Amer: >60 mL/min (ref >60)
Glucose, Bld: 117 mg/dL — ABNORMAL HIGH (ref 70–99)
Potassium: 3.9 mmol/L (ref 3.5–5.1)
Sodium: 132 mmol/L — ABNORMAL LOW (ref 135–145)

## 2019-10-14 LAB — CBC
HCT: 28.3 % — ABNORMAL LOW (ref 36.0–46.0)
Hemoglobin: 9.4 g/dL — ABNORMAL LOW (ref 12.0–15.0)
MCH: 30.8 pg (ref 26.0–34.0)
MCHC: 33.2 g/dL (ref 30.0–36.0)
MCV: 92.8 fL (ref 80.0–100.0)
Platelets: 356 10*3/uL (ref 150–400)
RBC: 3.05 MIL/uL — ABNORMAL LOW (ref 3.87–5.11)
RDW: 13.3 % (ref 11.5–15.5)
WBC: 12.5 10*3/uL — ABNORMAL HIGH (ref 4.0–10.5)
nRBC: 0 % (ref 0.0–0.2)

## 2019-10-14 LAB — RESPIRATORY PANEL BY RT PCR (FLU A&B, COVID)
Influenza A by PCR: NEGATIVE
Influenza B by PCR: NEGATIVE
SARS Coronavirus 2 by RT PCR: NEGATIVE

## 2019-10-14 LAB — HEPARIN LEVEL (UNFRACTIONATED): Heparin Unfractionated: 0.88 IU/mL — ABNORMAL HIGH (ref 0.30–0.70)

## 2019-10-14 LAB — APTT
aPTT: 45 seconds — ABNORMAL HIGH (ref 24–36)
aPTT: 58 seconds — ABNORMAL HIGH (ref 24–36)

## 2019-10-14 LAB — PROTIME-INR
INR: 1.3 — ABNORMAL HIGH (ref 0.8–1.2)
Prothrombin Time: 15.6 seconds — ABNORMAL HIGH (ref 11.4–15.2)

## 2019-10-14 SURGERY — PERIPHERAL VASCULAR THROMBECTOMY
Anesthesia: Moderate Sedation | Laterality: Left

## 2019-10-14 MED ORDER — ONDANSETRON HCL 4 MG/2ML IJ SOLN: 4.0000 mg | Freq: Four times a day (QID) | INTRAMUSCULAR | Status: DC | PRN

## 2019-10-14 MED ORDER — ALTEPLASE 2 MG IJ SOLR
INTRAMUSCULAR | Status: AC
  Filled 2019-10-14: qty 6

## 2019-10-14 MED ORDER — ALTEPLASE 2 MG IJ SOLR
INTRAMUSCULAR | Status: DC | PRN
  Administered 2019-10-14: 6 mg

## 2019-10-14 MED ORDER — FENTANYL CITRATE (PF) 100 MCG/2ML IJ SOLN
INTRAMUSCULAR | Status: AC
  Filled 2019-10-14: qty 2

## 2019-10-14 MED ORDER — MAGNESIUM HYDROXIDE 400 MG/5ML PO SUSP
30.0000 mL | Freq: Every day | ORAL | Status: DC | PRN
  Filled 2019-10-14: qty 30

## 2019-10-14 MED ORDER — FENTANYL CITRATE (PF) 100 MCG/2ML IJ SOLN
INTRAMUSCULAR | Status: DC | PRN
  Administered 2019-10-14 (×2): 25 ug via INTRAVENOUS
  Administered 2019-10-14: 50 ug via INTRAVENOUS

## 2019-10-14 MED ORDER — HEPARIN (PORCINE) 25000 UT/250ML-% IV SOLN: 10.0000 [IU]/kg/h | INTRAVENOUS | Status: DC

## 2019-10-14 MED ORDER — HEPARIN SODIUM (PORCINE) 1000 UNIT/ML IJ SOLN
INTRAMUSCULAR | Status: AC
  Filled 2019-10-14: qty 1

## 2019-10-14 MED ORDER — TRAZODONE HCL 50 MG PO TABS: 25.0000 mg | ORAL_TABLET | Freq: Every evening | ORAL | Status: DC | PRN

## 2019-10-14 MED ORDER — SODIUM CHLORIDE 0.9 % IV BOLUS: 1000.0000 mL | Freq: Once | INTRAVENOUS | Status: DC

## 2019-10-14 MED ORDER — METHYLPREDNISOLONE SODIUM SUCC 125 MG IJ SOLR: 125.0000 mg | Freq: Once | INTRAMUSCULAR | Status: DC | PRN

## 2019-10-14 MED ORDER — OXYCODONE HCL 5 MG PO TABS: 5.0000 mg | ORAL_TABLET | ORAL | Status: DC | PRN

## 2019-10-14 MED ORDER — FAMOTIDINE 20 MG PO TABS: 40.0000 mg | ORAL_TABLET | Freq: Once | ORAL | Status: DC | PRN

## 2019-10-14 MED ORDER — DIPHENHYDRAMINE HCL 50 MG/ML IJ SOLN: 50.0000 mg | Freq: Once | INTRAMUSCULAR | Status: DC | PRN

## 2019-10-14 MED ORDER — ACETAMINOPHEN 650 MG RE SUPP: 650.0000 mg | Freq: Four times a day (QID) | RECTAL | Status: DC | PRN

## 2019-10-14 MED ORDER — MIDAZOLAM HCL 2 MG/2ML IJ SOLN
INTRAMUSCULAR | Status: DC | PRN
  Administered 2019-10-14: 1 mg via INTRAVENOUS
  Administered 2019-10-14: 2 mg via INTRAVENOUS
  Administered 2019-10-14: 1 mg via INTRAVENOUS

## 2019-10-14 MED ORDER — ACETAMINOPHEN 325 MG PO TABS
650.0000 mg | ORAL_TABLET | Freq: Four times a day (QID) | ORAL | Status: DC | PRN
  Administered 2019-10-14: 21:00:00 650 mg via ORAL
  Filled 2019-10-14: qty 2

## 2019-10-14 MED ORDER — HYDROMORPHONE HCL 1 MG/ML IJ SOLN: 1.0000 mg | Freq: Once | INTRAMUSCULAR | Status: DC | PRN

## 2019-10-14 MED ORDER — IODIXANOL 320 MG/ML IV SOLN
INTRAVENOUS | Status: DC | PRN
  Administered 2019-10-14: 15:00:00 80 mL via INTRA_ARTERIAL

## 2019-10-14 MED ORDER — SODIUM CHLORIDE 0.9 % IV SOLN: INTRAVENOUS | Status: DC

## 2019-10-14 MED ORDER — CLINDAMYCIN PHOSPHATE 300 MG/50ML IV SOLN
INTRAVENOUS | Status: AC
  Administered 2019-10-14: 300 mg via INTRAVENOUS
  Filled 2019-10-14: qty 50

## 2019-10-14 MED ORDER — ONDANSETRON HCL 4 MG PO TABS: 4.0000 mg | ORAL_TABLET | Freq: Four times a day (QID) | ORAL | Status: DC | PRN

## 2019-10-14 MED ORDER — MIDAZOLAM HCL 5 MG/5ML IJ SOLN
INTRAMUSCULAR | Status: AC
  Filled 2019-10-14: qty 5

## 2019-10-14 MED ORDER — CLINDAMYCIN PHOSPHATE 300 MG/50ML IV SOLN
300.0000 mg | Freq: Once | INTRAVENOUS | Status: AC
  Filled 2019-10-14: qty 50

## 2019-10-14 MED ORDER — MIDAZOLAM HCL 2 MG/ML PO SYRP: 8.0000 mg | ORAL_SOLUTION | Freq: Once | ORAL | Status: DC | PRN

## 2019-10-14 SURGICAL SUPPLY — 17 items
BALLN ATG 14X6X80 (BALLOONS) ×2
BALLN DORADO 10X80X80 (BALLOONS) ×2
BALLOON ATG 14X6X80 (BALLOONS) IMPLANT
BALLOON DORADO 10X80X80 (BALLOONS) IMPLANT
CANISTER PENUMBRA ENGINE (MISCELLANEOUS) ×1 IMPLANT
CANNULA 5F STIFF (CANNULA) ×1 IMPLANT
CATH BEACON 5 .035 65 KMP TIP (CATHETERS) ×1 IMPLANT
CATH BEACON 5 .038 100 VERT TP (CATHETERS) ×1 IMPLANT
CATH INDIGO 12 HTORQ 115 (CATHETERS) ×1 IMPLANT
DEVICE PRESTO INFLATION (MISCELLANEOUS) ×1 IMPLANT
KIT FEMORAL DEL DENALI (Miscellaneous) ×1 IMPLANT
PACK ANGIOGRAPHY (CUSTOM PROCEDURE TRAY) ×2 IMPLANT
SHEATH PINNACLE 11FRX10 (SHEATH) ×1 IMPLANT
STENT VENOVO 16X100X80 (Permanent Stent) ×1 IMPLANT
SUT PROLENE 0 CT 1 30 (SUTURE) ×1 IMPLANT
WIRE J 3MM .035X145CM (WIRE) ×1 IMPLANT
WIRE MAGIC TORQUE 260C (WIRE) ×1 IMPLANT

## 2019-10-14 NOTE — ED Notes (Signed)
EDP at bedside at this time to speak with patient and update patient regarding procedure. EDP updating patient regarding patient going for DVT thrombectomy later today. Pt's daughter on the phone for update at this time.

## 2019-10-14 NOTE — ED Notes (Signed)
MD Owens Shark made aware of pt BP of 103/55

## 2019-10-14 NOTE — Consult Note (Signed)
[facility] Vascular Consult Note  MRN : [id number]  Rita Webb is a 75 y.o. ([birth year]) female who presents with chief complaint of  Chief Complaint  Patient presents with  . Leg Pain  . Leg Swelling   History of Present Illness:  Rita Webb  is a 74 year old  female with a known history of dyslipidemia, recently diagnosed right renal mass, presented to the emergency room with worsening left leg pain and swelling.   The patient endorses a history of being diagnosed with a left lower extremity DVT on Friday and started on Lovenox for anticoagulation.  Since her diagnosis on Friday, the patient notes that her left lower extremity has become more swollen and painful which prompted her to seek medical attention in our emergency department.  Patient states that she has been compliant with Lovenox dosing.  Denies any shortness of breath or chest pain.  She denies any recent surgery or trauma, prolonged immobility, known history of DVT or bleeding/clotting disorders.  Venous Duplex (10/13/19): Extensive occlusive left lower extremity DVT with proximal extension to the common femoral vein. Overall progression of the clot burden since the prior ultrasound.  Vascular surgery was consulted by Dr. Loleta Books due to the progression of the clot for possible endovascular intervention.  Current Facility-Administered Medications  Medication Dose Route Frequency Provider Last Rate Last Admin  . 0.9 %  sodium chloride infusion   Intravenous Continuous Mansy, Jan A, MD 100 mL/hr at 10/14/19 0224 New Bag at 10/14/19 0224  . 0.9 %  sodium chloride infusion   Intravenous Continuous Nusrat Encarnacion A, PA-C      . [MAR Hold] acetaminophen (TYLENOL) tablet 650 mg  650 mg Oral Q6H PRN Mansy, Arvella Merles, MD       Or  . Doug Sou Hold] acetaminophen (TYLENOL) suppository 650 mg  650 mg Rectal Q6H PRN Mansy, Jan A, MD      . clindamycin (CLEOCIN) 300 MG/50ML IVPB           . [START ON  10/15/2019] clindamycin (CLEOCIN) IVPB 300 mg  300 mg Intravenous Once Gayland Nicol A, PA-C      . diphenhydrAMINE (BENADRYL) injection 50 mg  50 mg Intravenous Once PRN Ulric Salzman A, PA-C      . famotidine (PEPCID) tablet 40 mg  40 mg Oral Once PRN Ashayla Subia A, PA-C      . fentaNYL (SUBLIMAZE) 100 MCG/2ML injection           . heparin 1000 UNIT/ML injection           . heparin ADULT infusion 100 units/mL (25000 units/266mL sodium chloride 0.45%)  1,100 Units/hr Intravenous Continuous Edwin Dada, MD 11 mL/hr at 10/14/19 1101 1,100 Units/hr at 10/14/19 1101  . [MAR Hold] HYDROmorphone (DILAUDID) injection 1 mg  1 mg Intravenous Once PRN Brenton Joines, Janalyn Harder, PA-C      . [MAR Hold] magnesium hydroxide (MILK OF MAGNESIA) suspension 30 mL  30 mL Oral Daily PRN Mansy, Jan A, MD      . methylPREDNISolone sodium succinate (SOLU-MEDROL) 125 mg/2 mL injection 125 mg  125 mg Intravenous Once PRN Jaeden Messer A, PA-C      . midazolam (VERSED) 2 MG/ML syrup 8 mg  8 mg Oral Once PRN Phyllistine Domingos A, PA-C      . midazolam (VERSED) 5 MG/5ML injection           . [MAR Hold] ondansetron (ZOFRAN) tablet 4 mg  4  mg Oral Q6H PRN Mansy, Arvella Merles, MD       Or  . Doug Sou Hold] ondansetron [facility]) injection 4 mg  4 mg Intravenous Q6H PRN Mansy, Arvella Merles, MD      . Doug Sou Hold] ondansetron [facility]) injection 4 mg  4 mg Intravenous Q6H PRN Mikel Hardgrove, Janalyn Harder, PA-C      . [MAR Hold] oxyCODONE (Oxy IR/ROXICODONE) immediate release tablet 5 mg  5 mg Oral Q4H PRN Mansy, Arvella Merles, MD      . Doug Sou Hold] traZODone (DESYREL) tablet 25 mg  25 mg Oral QHS PRN Mansy, Arvella Merles, MD       Past Medical History:  Diagnosis Date  . Allergy   . Hyperlipidemia    Past Surgical History:  Procedure Laterality Date  . GANGLION CYST EXCISION Right 1996   wrist  . MELANOMA EXCISION  08/2011  . SPINE SURGERY     Social History Social History   Tobacco Use  . Smoking status: Former Smoker    Quit  date: 11/10/1968    Years since quitting: 50.9  . Smokeless tobacco: Never Used  Substance Use Topics  . Alcohol use: No  . Drug use: No   Family History Family History  Problem Relation Age of Onset  . Breast cancer Mother 67  . Prostate cancer Father   . Diabetes Maternal Aunt   . Diabetes Maternal Uncle   Denies family history of peripheral artery disease, venous disease or renal disease.  Allergies  Allergen Reactions  . Cefuroxime Diarrhea   REVIEW OF SYSTEMS (Negative unless checked)  Constitutional: [] Weight loss  [] Fever  [] Chills Cardiac: [] Chest pain   [] Chest pressure   [] Palpitations   [] Shortness of breath when laying flat   [] Shortness of breath at rest   [] Shortness of breath with exertion. Vascular:  [] Pain in legs with walking   [] Pain in legs at rest   [] Pain in legs when laying flat   [] Claudication   [] Pain in feet when walking  [] Pain in feet at rest  [] Pain in feet when laying flat   [] History of DVT   [] Phlebitis   [x] Swelling in legs   [] Varicose veins   [] Non-healing ulcers Pulmonary:   [] Uses home oxygen   [] Productive cough   [] Hemoptysis   [] Wheeze  [] COPD   [] Asthma Neurologic:  [] Dizziness  [] Blackouts   [] Seizures   [] History of stroke   [] History of TIA  [] Aphasia   [] Temporary blindness   [] Dysphagia   [] Weakness or numbness in arms   [] Weakness or numbness in legs Musculoskeletal:  [] Arthritis   [] Joint swelling   [] Joint pain   [] Low back pain Hematologic:  [] Easy bruising  [] Easy bleeding   [x] Hypercoagulable state   [] Anemic  [] Hepatitis Gastrointestinal:  [] Blood in stool   [] Vomiting blood  [] Gastroesophageal reflux/heartburn   [] Difficulty swallowing. Genitourinary:  [] Chronic kidney disease   [] Difficult urination  [] Frequent urination  [] Burning with urination   [] Blood in urine Skin:  [] Rashes   [] Ulcers   [] Wounds Psychological:  [] History of anxiety   []  History of major depression.  Physical Examination  Vitals:   10/14/19 0945 10/14/19  1030 10/14/19 1115 10/14/19 1316  BP: (!) 106/44 (!) 120/58 (!) 105/51 (!) 131/58  Pulse: 80 86 78 91  Resp: (!) 23   20  Temp:    98.5 F (36.9 C)  TempSrc:    Oral  SpO2: 97% 96% 94% 95%  Weight:    54 kg  Height:  5\' 5"  (1.651 m)   Body mass index is 19.81 kg/m. Gen:  WD/WN, NAD Head: /AT, No temporalis wasting. Prominent temp pulse not noted. Ear/Nose/Throat: Hearing grossly intact, nares w/o erythema or drainage, oropharynx w/o Erythema/Exudate Eyes: Sclera non-icteric, conjunctiva clear Neck: Trachea midline.  No JVD.  Pulmonary:  Good air movement, respirations not labored, equal bilaterally.  Cardiac: RRR, normal S1, S2. Vascular:  Vessel Right Left  Radial Palpable Palpable  Ulnar Palpable Palpable  Brachial Palpable Palpable  Carotid Palpable, without bruit Palpable, without bruit  Aorta Not palpable N/A  Femoral Palpable Palpable  Popliteal Palpable Palpable  PT Palpable Palpable  DP Palpable Palpable   Left lower extremity: Thigh soft.  Calf soft.  There is no acute vascular compromise noted to the left lower extremity at this time.  Moderate edema.  This slightly tender to palpation.  Pain with dorsiflexion.  Gastrointestinal: soft, non-tender/non-distended. No guarding/reflex.  Musculoskeletal: M/S 5/5 throughout.  Extremities without ischemic changes.  No deformity or atrophy.  Neurologic: Sensation grossly intact in extremities.  Symmetrical.  Speech is fluent. Motor exam as listed above. Psychiatric: Judgment intact, Mood & affect appropriate for pt's clinical situation. Dermatologic: No rashes or ulcers noted.  No cellulitis or open wounds. Lymph : No Cervical, Axillary, or Inguinal lymphadenopathy.  CBC Lab Results  Component Value Date   WBC 12.5 (H) 10/14/2019   HGB 9.4 (L) 10/14/2019   HCT 28.3 (L) 10/14/2019   MCV 92.8 10/14/2019   PLT 356 10/14/2019   BMET    Component Value Date/Time   NA 132 (L) 10/14/2019 0544   NA 143 02/18/2019  1024   K 3.9 10/14/2019 0544   CL 99 10/14/2019 0544   CO2 27 10/14/2019 0544   GLUCOSE 117 (H) 10/14/2019 0544   BUN 13 10/14/2019 0544   BUN 14 02/18/2019 1024   CREATININE 0.45 10/14/2019 0544   CALCIUM 8.2 (L) 10/14/2019 0544   GFRNONAA >60 10/14/2019 0544   GFRAA >60 10/14/2019 0544   Estimated Creatinine Clearance: 54.2 mL/min (by C-G formula based on SCr of 0.45 mg/dL).  COAG Lab Results  Component Value Date   INR 1.3 (H) 10/13/2019   Radiology US Venous Img Lower Unilateral Left (DVT)  Result Date: 10/13/2019 CLINICAL DATA:  74 year old female with left lower extremity pain and swelling x1 day. Left lower extremity DVT seen on the recent ultrasound. EXAM: Left LOWER EXTREMITY VENOUS DOPPLER ULTRASOUND TECHNIQUE: Gray-scale sonography with graded compression, as well as color Doppler and duplex ultrasound were performed to evaluate the lower extremity deep venous systems from the level of the common femoral vein and including the common femoral, femoral, profunda femoral, popliteal and calf veins including the posterior tibial, peroneal and gastrocnemius veins when visible. The superficial great saphenous vein was also interrogated. Spectral Doppler was utilized to evaluate flow at rest and with distal augmentation maneuvers in the common femoral, femoral and popliteal veins. COMPARISON:  Ultrasound dated 10/08/2019. FINDINGS: Contralateral Common Femoral Vein: Respiratory phasicity is normal and symmetric with the symptomatic side. No evidence of thrombus. Normal compressibility. Common Femoral Vein: Occlusive thrombus with distension and noncompression of the vessel. Saphenofemoral Junction: Occlusive thrombus with none compression. Profunda Femoral Vein: Occlusive thrombus with noncompression. Femoral Vein: Occlusive thrombus.  No flow or compression. Popliteal Vein: Occlusive thrombus with distension of the vessel and noncompression. Calf Veins: There is near complete occlusion of  the peroneal vein. The posterior tibial vein appears patent. Superficial Great Saphenous Vein: Near complete occlusive thrombus through the  great saphenous vein at the thigh. The visualized great saphenous vein is patent at the level of the knee. Venous Reflux:  None. Other Findings:  None. IMPRESSION: Extensive occlusive left lower extremity DVT with proximal extension to the common femoral vein. Overall progression of the clot burden since the prior ultrasound. These results were called by telephone at the time of interpretation on 10/13/2019 at 8:33 pm to provider Merlyn Lot , who verbally acknowledged these results. Electronically Signed   By: Anner Crete M.D.   On: 10/13/2019 20:38   US Venous Img Lower Unilateral Left  Result Date: 10/08/2019 CLINICAL DATA:  Renal cell carcinoma, superficial thrombophlebitis left lower extremity EXAM: LEFT LOWER EXTREMITY VENOUS DOPPLER ULTRASOUND TECHNIQUE: Gray-scale sonography with graded compression, as well as color Doppler and duplex ultrasound were performed to evaluate the lower extremity deep venous systems from the level of the common femoral vein and including the common femoral, femoral, profunda femoral, popliteal and calf veins including the posterior tibial, peroneal and gastrocnemius veins when visible. The superficial great saphenous vein was also interrogated. Spectral Doppler was utilized to evaluate flow at rest and with distal augmentation maneuvers in the common femoral, femoral and popliteal veins. COMPARISON:  None. FINDINGS: Contralateral Common Femoral Vein: Respiratory phasicity is normal and symmetric with the symptomatic side. No evidence of thrombus. Normal compressibility. Common Femoral Vein: There is occlusive thrombus within the left common femoral vein extending to the saphenous junction. Saphenofemoral Junction: Occlusive thrombus at the saphenofemoral junction. Profunda Femoral Vein: No evidence of thrombus. Normal  compressibility and flow on color Doppler imaging. Femoral Vein: No evidence of thrombus. Normal compressibility, respiratory phasicity and response to augmentation. Popliteal Vein: No evidence of thrombus. Normal compressibility, respiratory phasicity and response to augmentation. Calf Veins: No evidence of thrombus. Normal compressibility and flow on color Doppler imaging. Varicosities are identified at the site of palpable abnormality. Superficial Great Saphenous Vein: There is occlusive thrombus extending from the proximal through the distal thigh. Other Findings: There is thrombus within the distal margin of the left external iliac vein extending into the common femoral vein. The proximal aspect of the external iliac vein is not well visualized, but the IVC is patent. IMPRESSION: 1. Deep venous thrombosis extending from the distal aspect of the left external iliac vein through the level of the common femoral vein at the saphenofemoral junction. 2. Superficial thrombosis of the left greater saphenous vein from the proximal through the distal thigh. 3. Patent calf veins, though superficial varicosities are identified at the site of palpable abnormality. Electronically Signed   By: Randa Ngo M.D.   On: 10/08/2019 16:19   Assessment/Plan Rita Webb  is a 74 year old  female with a known history of dyslipidemia, recently diagnosed right renal mass, presented to the emergency room with acute onset of worsening left leg pain and swelling.   1.  Left lower extremity DVT: Patient was first diagnosed with DVT on Friday.  The patient was started on Lovenox for anticoagulation.  Patient states she has been compliant with Lovenox dosing.  Patient presented to the [facility] emergency department after progressive worsening in pain and swelling to left lower extremity and found to have proximal extension of her DVT to the common femoral vein.  Since the patient has experienced extension  of her DVT approximately while being compliant on Lovenox will consider her feeling anticoagulation at this point.  Recommend placement of an IVC filter to prevent PE as well as undergoing a  lower extremity venous thrombectomy / lysis to lessen the clot burden, improve symptoms and decrease the probability of phlebitic problems.  Procedure, risks and benefits of plan to the patient.  All questions were answered.  Patient wished to proceed.  2.  Right renal mass: Patient was supposed undergo excision of her right renal mass at [facility] today however presented to the [facility] emergency department due to progressively worsening right lower extremity pain and swelling.  Would assume her right renal mass is the contributing factor to her DVT.  3.  Hematology consult: Agree with hematology consult as the patient's DVT did worsen while she was being compliant with Lovenox dosing.  Discussed with Dr. Mayme Genta, PA-C  10/14/2019 1:32 PM  This note was created with Dragon medical transcription system.  Any error is purely unintentional

## 2019-10-14 NOTE — Progress Notes (Signed)
PROGRESS NOTE    Rita Webb  GQQ:[id number] DOB: [birth year] DOA: 10/13/2019 PCP: Virginia Crews, MD      Brief Narrative:  Rita Webb  is a 74 y.o. female with a known history of dyslipidemia oncology as well as a recently diagnosed right renal mass, presented to the emergency room with acute onset of worsening left leg pain and swelling.  Patient was diagnosed with DVT on Friday and was started on subcutaneous Lovenox in the spine that has been having significant worsening of symptoms as mentioned above.  She admitted to fever without chills this afternoon.  No chest pain or dyspnea or palpitations or cough or wheezing or hemoptysis.  No nausea or abdominal pain.  No bleeding diathesis.  Pain was graded as initially 8/10 in severity and later on improved to 2/10.Marland Kitchen  Upon presentation to the emergency room, temperature was 96.9 otherwise vital signs were within normal.  Labs revealed unremarkable CMP.  CBC showed leukocytosis and anemia.  Left lower extremity venous duplex revealed extensive occlusive left lower extremity DVT with proximal extension to the common femoral vein and overall progression of the clot burden since prior ultrasound.  The patient was given 2 mg of IV morphine sulfate and 4 mg of IV Zofran that brought the pain down from 8/10-2/10 and was started on IV heparin with a bolus then drip.  She will be admitted to a medical monitored bed for further evaluation and management.   Assessment & Plan:  1.  Extensive left lower extremity DVT. -Continue heparin -Follow with Dr. Lucky Cowboy -Continue pain control   2.  Right renal mass and calculus gallbladder. -She was expected to have surgical excision for both at [facility] on 4/8. -Pain management will be provided.  3.  Anxiety and depression -We will continue Klonopin and Zoloft.  4.  Allergic rhinitis. -Flonase will be resumed.  5.  Dyslipidemia. -Zocor will be resumed.  6.  DVT  prophylaxis. -The patient will be on IV heparin as mentioned above.   Recent Labs  Lab 10/13/19 1913 10/14/19 0544  NA 132* 132*  K 3.8 3.9  CO2 25 27  BUN 12 13  CREATININE 0.51 0.45  WBC 12.7* 12.5*  HGB 10.0* 9.4*  PLT 367 356    Current Meds  Medication Sig  . B Complex Vitamins (B COMPLEX 1 PO) Take 1 tablet by mouth daily.  . B COMPLEX-C PO Take 1 tablet by mouth daily.   . Calcium Carbonate-Vitamin D 600-200 MG-UNIT CAPS Take 2 capsules by mouth daily.   . Cetirizine HCl 10 MG CAPS Take 1 capsule by mouth daily.   . Cholecalciferol 1000 UNITS capsule Take 1,000 Units by mouth daily.   . clonazePAM (KLONOPIN) 0.5 MG tablet Take 0.5-1 tablets (0.25-0.5 mg total) by mouth 2 (two) times daily as needed for anxiety.  . enoxaparin (LOVENOX) 80 MG/0.8ML injection Inject 0.8 mLs (80 mg total) into the skin daily for 7 days.  . fluticasone (FLONASE) 50 MCG/ACT nasal spray Place 1 spray into the nose daily.  . montelukast (SINGULAIR) 10 MG tablet Take 10 mg by mouth at bedtime.   . MULTIPLE VITAMIN PO Take 1 tablet by mouth daily.   . Omega-3 Fatty Acids (FISH OIL) 1200 MG CAPS Take 1 capsule by mouth daily.   . ondansetron (ZOFRAN) 4 MG tablet Take 1 tablet (4 mg total) by mouth every 8 (eight) hours as needed for nausea or vomiting.  Marland Kitchen oxymetazoline (MUCINEX NASAL SPRAY FULL  FORCE) 0.05 % nasal spray Place 1 spray into both nostrils 2 (two) times daily.  . psyllium (REGULOID) 0.52 G capsule Take 1 capsule by mouth 3 (three) times daily.   . sertraline (ZOLOFT) 50 MG tablet Take 1 tablet (50 mg total) by mouth daily.  . simvastatin (ZOCOR) 10 MG tablet TAKE ONE TABLET BY MOUTH AT BEDTIME  . UNABLE TO FIND Med Name: Allergy Shots once a week        Disposition: The patient was admitted with left DVT.   I will discharge when vascular surgey have determined their treatment plan.        MDM: This is a no charge note.  For further details, please see H&P by my partner  Dr. Sidney Ace from earlier today.  The below labs and imaging reports were reviewed and summarized above.    DVT prophylaxis: N?A Code Status: FULL Family Communication: Husband at bedside    Consultants:   Vascular surgery  Procedures:     Antimicrobials:      Culture data:              Subjective: Patient with continued left leg pain        Objective: Vitals:   10/14/19 0415 10/14/19 0500 10/14/19 0544 10/14/19 0724  BP: (!) 109/47 (!) 105/50 (!) 107/54 (!) 116/51  Pulse: 79 83 82 86  Resp: (!) 22 (!) 21 (!) 22 (!) 22  Temp:    98.9 F (37.2 C)  TempSrc:    Oral  SpO2: 94% 94% 95% 96%  Weight:      Height:        Intake/Output Summary (Last 24 hours) at 10/14/2019 0808 Last data filed at 10/14/2019 0724 Gross per 24 hour  Intake 68.72 ml  Output --  Net 68.72 ml   Filed Weights   10/13/19 1857  Weight: 54 kg    Examination: The patient was seen and examined.      Data Reviewed: I have personally reviewed following labs and imaging studies:  CBC: Recent Labs  Lab 10/13/19 1913 10/14/19 0544  WBC 12.7* 12.5*  HGB 10.0* 9.4*  HCT 29.6* 28.3*  MCV 90.8 92.8  PLT 367 175   Basic Metabolic Panel: Recent Labs  Lab 10/13/19 1913 10/14/19 0544  NA 132* 132*  K 3.8 3.9  CL 98 99  CO2 25 27  GLUCOSE 123* 117*  BUN 12 13  CREATININE 0.51 0.45  CALCIUM 8.4* 8.2*   GFR: Estimated Creatinine Clearance: 54.2 mL/min (by C-G formula based on SCr of 0.45 mg/dL). Liver Function Tests: Recent Labs  Lab 10/13/19 1913  AST 25  ALT 40  ALKPHOS 84  BILITOT 0.6  PROT 6.4*  ALBUMIN 2.8*   No results for input(s): LIPASE, AMYLASE in the last 168 hours. No results for input(s): AMMONIA in the last 168 hours. Coagulation Profile: Recent Labs  Lab 10/13/19 2343  INR 1.3*   Cardiac Enzymes: No results for input(s): CKTOTAL, CKMB, CKMBINDEX, TROPONINI in the last 168 hours. BNP (last 3 results) No results for input(s): PROBNP in the  last 8760 hours. HbA1C: No results for input(s): HGBA1C in the last 72 hours. CBG: No results for input(s): GLUCAP in the last 168 hours. Lipid Profile: No results for input(s): CHOL, HDL, LDLCALC, TRIG, CHOLHDL, LDLDIRECT in the last 72 hours. Thyroid Function Tests: No results for input(s): TSH, T4TOTAL, FREET4, T3FREE, THYROIDAB in the last 72 hours. Anemia Panel: No results for input(s): VITAMINB12, FOLATE, FERRITIN, TIBC, IRON,  RETICCTPCT in the last 72 hours. Urine analysis: No results found for: COLORURINE, APPEARANCEUR, LABSPEC, PHURINE, GLUCOSEU, HGBUR, BILIRUBINUR, KETONESUR, PROTEINUR, UROBILINOGEN, NITRITE, LEUKOCYTESUR Sepsis Labs: @LABRCNTIP (procalcitonin:4,lacticacidven:4)  )No results found for this or any previous visit (from the past 240 hour(s)).       Radiology Studies: US Venous Img Lower Unilateral Left (DVT)  Result Date: 10/13/2019 CLINICAL DATA:  74 year old female with left lower extremity pain and swelling x1 day. Left lower extremity DVT seen on the recent ultrasound. EXAM: Left LOWER EXTREMITY VENOUS DOPPLER ULTRASOUND TECHNIQUE: Gray-scale sonography with graded compression, as well as color Doppler and duplex ultrasound were performed to evaluate the lower extremity deep venous systems from the level of the common femoral vein and including the common femoral, femoral, profunda femoral, popliteal and calf veins including the posterior tibial, peroneal and gastrocnemius veins when visible. The superficial great saphenous vein was also interrogated. Spectral Doppler was utilized to evaluate flow at rest and with distal augmentation maneuvers in the common femoral, femoral and popliteal veins. COMPARISON:  Ultrasound dated 10/08/2019. FINDINGS: Contralateral Common Femoral Vein: Respiratory phasicity is normal and symmetric with the symptomatic side. No evidence of thrombus. Normal compressibility. Common Femoral Vein: Occlusive thrombus with distension and  noncompression of the vessel. Saphenofemoral Junction: Occlusive thrombus with none compression. Profunda Femoral Vein: Occlusive thrombus with noncompression. Femoral Vein: Occlusive thrombus.  No flow or compression. Popliteal Vein: Occlusive thrombus with distension of the vessel and noncompression. Calf Veins: There is near complete occlusion of the peroneal vein. The posterior tibial vein appears patent. Superficial Great Saphenous Vein: Near complete occlusive thrombus through the great saphenous vein at the thigh. The visualized great saphenous vein is patent at the level of the knee. Venous Reflux:  None. Other Findings:  None. IMPRESSION: Extensive occlusive left lower extremity DVT with proximal extension to the common femoral vein. Overall progression of the clot burden since the prior ultrasound. These results were called by telephone at the time of interpretation on 10/13/2019 at 8:33 pm to provider Merlyn Lot , who verbally acknowledged these results. Electronically Signed   By: Anner Crete M.D.   On: 10/13/2019 20:38        Scheduled Meds: Continuous Infusions: . sodium chloride 100 mL/hr at 10/14/19 0224  . heparin 1,100 Units/hr (10/14/19 0724)     LOS: 1 day    Time spent: 15 minutes    Edwin Dada, MD [facility] 10/14/2019, 8:08 AM     Please page though [place] or Epic secure chat:  For password, contact charge nurse

## 2019-10-14 NOTE — H&P (Signed)
[facility] History & Physical Update  The patient was interviewed and re-examined.  The patient's previous History and Physical has been reviewed and is unchanged.  There is no change in the plan of care. We plan to proceed with the scheduled procedure.  Leotis Pain, MD  10/14/2019, 1:39 PM

## 2019-10-14 NOTE — Progress Notes (Addendum)
ANTICOAGULATION CONSULT NOTE - Follow Up Consult  Pharmacy Consult for heparin Indication: DVT  Allergies  Allergen Reactions  . Cefuroxime Diarrhea    Patient Measurements: Height: 5\' 5"  (165.1 cm) Weight: 54 kg (119 lb) IBW/kg (Calculated) : 57 Heparin Dosing Weight: 54 kg  Vital Signs: Temp: 99.6 F (37.6 C) (04/07 1856) Temp Source: Oral (04/07 1856) BP: 108/48 (04/08 0030) Pulse Rate: 84 (04/08 0030)  Labs: Recent Labs    10/13/19 1913 10/13/19 2343 10/13/19 2353  HGB 10.0*  --   --   HCT 29.6*  --   --   PLT 367  --   --   APTT  --  45*  --   LABPROT  --  15.6*  --   INR  --  1.3*  --   HEPARINUNFRC  --   --  0.88*  CREATININE 0.51  --   --     Estimated Creatinine Clearance: 54.2 mL/min (by C-G formula based on SCr of 0.51 mg/dL).   Medications:  Scheduled:    Assessment: Patient was going to [facility] hematology, but arrives tonight for pain and swelling of left leg pain w/ extensive occlusive DVT on [place] was planned to start eliquis PTA after being on lovenox? Patient is being started on heparin drip for extensive occlusive DVT. Baseline CBC lower than patient's normal, aPTT 45 seconds, anti-Xa 0.88 which is not surprising.  Goal of Therapy:  Heparin level 0.3-0.7 units/ml Monitor platelets by anticoagulation protocol: Yes   Plan:  Will omit boluses for now since patient took last dose of lovenox (1.5 mg/kg q24h) 04/07 at 1830 Will start heparin rate at 950 units/hr Will check aPTT at 0600 and dose per aPTT for now since baseline anti-Xa > 0.1. Will monitor daily CBC's and adjust per aPTTs for now.  Tobie Lords, PharmD, BCPS Clinical Pharmacist 10/14/2019,1:36 AM

## 2019-10-14 NOTE — ED Notes (Signed)
Report given to Claiborne Billings, RN in Special procedures

## 2019-10-14 NOTE — Telephone Encounter (Addendum)
Sharyn Lull, I believe you handled this yesterday     Copied from [place] [id number]. Topic: General - Other >> Oct 14, 2019  3:23 PM Rainey Pines A wrote: Patients daughter called in regards to patient having blood clot in leg and she has been admitted and also had surgery today. She had clot removed from leg and a filter pt in as well. Patient did very well during surgery as well and is recovering.

## 2019-10-14 NOTE — Progress Notes (Signed)
Per Dr. Lucky Cowboy, ok for Patient to sit up now. Also PAD to remain and be removed 4/9 AM. Report Called to 1 C RN. Pt prepared for transport.

## 2019-10-14 NOTE — Progress Notes (Signed)
Pt resting in bed. Right groin site intact, left knee site intact. Pt BP soft 84/40. Kaleen Mask PA notified. PA to place orders for Saline bolus. Will monitor closely.

## 2019-10-14 NOTE — ED Notes (Signed)
Pt ambulated to bathroom. Pt c/o pain 7/10 while walking.  Pt pain 2/10 when in bed. Pt resting comfortably, husband bedside.

## 2019-10-14 NOTE — ED Notes (Signed)
This RN to bedside due to alarms, tp visualized in NAD, pt's husband remains at bedside at this time. Pt's cardiac leads adjusted at this time. This RN explained central monitoring to patient and husband, apologized for alarms. Pt states understanding at this time. Call bell remains within reach.

## 2019-10-14 NOTE — Consult Note (Signed)
Patient undergoing IVC filter placement and thrombectomy, therefore unable to evaluate.  Patient is a 74 year old female recently noted to have incidental renal mass highly concerning for renal cell carcinoma.  Approximately 5 days ago she developed unprovoked left leg DVT and was placed on Lovenox.  Upon evaluation in the emergency room earlier today, patient noted to have mild propagation of DVT leading to IVC filter and thrombectomy as above.  Case discussed at length with her urology surgeon at [facility].  Plan to schedule her nephrectomy first week of May.  Agree with heparin drip at this time.  Will likely transition patient to Eliquis or Xarelto during this admission.  Although the likely etiology of her DVT is the underlying malignancy, plan to do a full hypercoagulable work-up for completeness.  Full consult to follow.

## 2019-10-14 NOTE — Op Note (Signed)
[facility]   OPERATIVE NOTE   PRE-OPERATIVE DIAGNOSIS: extensive left leg DVT  POST-OPERATIVE DIAGNOSIS: same   PROCEDURE: 1. US guidance for vascular access to right femoral vein and left popliteal vein 2. Catheter placement into IVC from right femoral vein approach and into the left common iliac vein from the left popliteal approach 3. IVC gram and left lower extremity venogram 4. IVC filter placement 5.   Catheter directed thrombolysis with 6 mg of TPA to the left iliac veins, common femoral vein, and superficial femoral vein 6. Mechanical thrombectomy to the left common and external iliac veins, common femoral vein, superficial femoral vein, and popliteal vein with the penumbra CAT 12 device 7. PTA of left superficial femoral, common femoral, external iliac, and common iliac veins with 10 mm balloon 8. Stent placement of the left common and external iliac veins with 16 mm diameter by 10 cm length VeNovo stent   SURGEON: Leotis Pain, MD  ASSISTANT(S): none  ANESTHESIA: local with moderate conscious sedation for 60 minutes using 4 mg of Versed and 100 mcg of Fentanyl  ESTIMATED BLOOD LOSS: 325 cc  FINDING(S): 1. Extensive thrombus throughout the left lower extremity including the popliteal vein, superficial femoral vein, common femoral vein, common and external iliac veins up to the iliac vein confluence to the IVC.  The IVC itself was not thrombosed.  SPECIMEN(S): none  INDICATIONS:  Patient is a 74 y.o. female who presents with extensive left lower extremity DVT and a renal cell carcinoma. Patient has marked leg swelling and pain. Venous intervention is performed to reduce the symtpoms and avoid long term postphlebitic symptoms.   DESCRIPTION: After obtaining full informed written consent, the patient was brought back to the vascular suite and placed supine upon the table.Moderate conscious sedation was administered during a face  to face encounter with the patient throughout the procedure with my supervision of the RN administering medicines and monitoring the patient's vital signs, pulse oximetry, telemetry and mental status throughout from the start of the procedure until the patient was taken to the recovery room. After obtaining adequate anesthesia, the patient was prepped and draped in the standard fashion. The right femoral vein was then accessed under US guidance and found to be widely patent. It was accessed without difficulty and a permanent image was recorded. I then placed the delivery sheath into the IVC. The IVC was patent and the renal veins were at the level of L1. The retrievable IVC filter was then deployed at the level of top of L2. The delivery sheath was then removed and dressings were placed in the right groin.  The patient was then placed into the prone position. The left popliteal vein was then accessed under direct ultrasound guidance without difficulty with a micropuncture needle and a permanent image was recorded. I then upsized to an 11Fr sheath over a J wire. The patient was on a heparin drip until the time of the procedure so additional heparin was not given. Imaging showed extensive DVT with minimal flow. A Kumpe catheter and Magic tourque wire were then advanced into the CFV and images were performed. Common femoral vein was completely thrombosed with only a trickle of flow through the iliac veins which had extensive thrombosis and stenosis.  I was able to cross the thrombus and stenosis and advance into the proximal left common iliac vein which still had thrombus and narrowing, but the IVC itself appeared to be patent. I then used the Kumpe catheter and  instilled 6 mg of tpa throughout the left iliac, common femoral, and superficial femoral veins.  After this dwelled, I used the Penumbra Cat 12 catheter and evacuated about 100 of effluent with mechanical thrombectomy throughout the iliac  veins, CFV, SFV, and popliteal vein. This had mild improvement. 2 more additional passes were performed with the penumbra CAT 12 catheter with extensive thrombus removed.  Each time the catheter was removed it was completely full of clot and large amounts of thrombus had also been removed into the suction system.  I then treated the SFV and CFV with 8 cm length 10 mm diameter angioplasty balloon plated up to 8 to 10 atm for 30 seconds to open a channel. This resulted in some resolution of the thrombus and improved flow. I then turned my attention to the iliac veins. The stenosis/occlusion and thrombus was treated with a 8 cm length 10 mm diameter mm diameter angioplasty balloon.  These were inflated again to 8 to 10 atm for 30 seconds.  Following this, there still remained nearly occlusive stenosis and thrombus throughout the iliac veins.  An additional pass was performed the penumbra cat 12 catheter with more thrombus removed, but there appeared to be fairly extensive stenosis as well.  I elected to treat this with a VeNovo stent.  We selected a 16 mm diameter by 10 cm length stent and deployed this from the origin of the left common iliac vein taking care not to encroach on the inferior vena cava or the right iliac vein down to the mid to distal left external iliac vein.  This is postop with a 14 mm.  There still remained residual thrombus and mild to moderate residual stenosis but this was markedly improved.  An additional pass was performed the penumbra cat 12 catheter with still more clot removed from the iliac vein system.  Once we had reached a little over 300 cc of blood loss and had remove the vast majority of the clot, I felt it was prudent to stop.  I then elected to terminate the procedure. The sheath was removed and a dressing was placed. She was taken to the recovery room in stable condition having tolerated the procedure well.   COMPLICATIONS: None  CONDITION: Stable  Leotis Pain 10/14/2019 2:56 PM

## 2019-10-14 NOTE — Progress Notes (Signed)
ANTICOAGULATION CONSULT NOTE - Follow Up Consult  Pharmacy Consult for heparin Indication: DVT  Allergies  Allergen Reactions  . Cefuroxime Diarrhea    Patient Measurements: Height: 5\' 5"  (165.1 cm) Weight: 54 kg (119 lb) IBW/kg (Calculated) : 57 Heparin Dosing Weight: 54 kg  Vital Signs: BP: 107/54 (04/08 0544) Pulse Rate: 82 (04/08 0544)  Labs: Recent Labs    10/13/19 1913 10/13/19 2343 10/13/19 2353 10/14/19 0544  HGB 10.0*  --   --  9.4*  HCT 29.6*  --   --  28.3*  PLT 367  --   --  356  APTT  --  45*  --  58*  LABPROT  --  15.6*  --   --   INR  --  1.3*  --   --   HEPARINUNFRC  --   --  0.88*  --   CREATININE 0.51  --   --  0.45    Estimated Creatinine Clearance: 54.2 mL/min (by C-G formula based on SCr of 0.45 mg/dL).   Medications:  Scheduled:    Assessment: Patient was going to [facility] hematology, but arrives tonight for pain and swelling of left leg pain w/ extensive occlusive DVT on [place] was planned to start eliquis PTA after being on lovenox? Patient is being started on heparin drip for extensive occlusive DVT. Baseline CBC lower than patient's normal, aPTT 45 seconds, anti-Xa 0.88 which is not surprising.  Goal of Therapy:  Heparin level 0.3-0.7 units/ml Monitor platelets by anticoagulation protocol: Yes   Plan:  04/08 @ 0600 aPTT 58 seconds. Will increase rate to 1100 units/hr and will recheck aPTT at 1300, h/h trending down will continue to monitor.  Tobie Lords, PharmD, BCPS Clinical Pharmacist 10/14/2019,7:04 AM

## 2019-10-14 NOTE — ED Notes (Addendum)
Pt is resting in bed, husband at bedside. Pt denies any needs at this time. Respirations are equal and unlabored, no signs of acute distress.

## 2019-10-14 NOTE — Progress Notes (Signed)
ANTICOAGULATION CONSULT NOTE  Pharmacy Consult for heparin Indication: DVT  Patient Measurements: Height: 5\' 5"  (165.1 cm) Weight: 54 kg (119 lb 0.8 oz) IBW/kg (Calculated) : 57 Heparin Dosing Weight: 54 kg  Vital Signs: Temp: 98.5 F (36.9 C) (04/08 1316) Temp Source: Oral (04/08 1316) BP: 131/58 (04/08 1316) Pulse Rate: 91 (04/08 1316)  Labs: Recent Labs    10/13/19 1913 10/13/19 2343 10/13/19 2353 10/14/19 0544  HGB 10.0*  --   --  9.4*  HCT 29.6*  --   --  28.3*  PLT 367  --   --  356  APTT  --  45*  --  58*  LABPROT  --  15.6*  --   --   INR  --  1.3*  --   --   HEPARINUNFRC  --   --  0.88*  --   CREATININE 0.51  --   --  0.45    Estimated Creatinine Clearance: 54.2 mL/min (by C-G formula based on SCr of 0.45 mg/dL).   Medications:  Scheduled:  . fentaNYL      . fentaNYL      . heparin      . midazolam        Assessment: Patient was going to [facility] hematology, but arrives with pain and swelling of left leg pain w/ extensive occlusive DVT on [place] was planned to start eliquis PTA after being on lovenox. Patient was started on heparin drip for extensive occlusive DVT. H&H trending down, platelets wnl and stable  Goal of Therapy:  Heparin level 0.3-0.7 units/ml aPTT 66 - 102s Monitor platelets by anticoagulation protocol: Yes   Plan:   Heparin was held at 1320 for placement of an IVC filter and restarted at 1515 at 1100 units/hr  Recheck aPTT and anti-Xa at 0000 10/15/19  Next CBC in am  Vallery Sa, PharmD, BCPS Clinical Pharmacist 10/14/2019,3:06 PM

## 2019-10-14 NOTE — H&P (Signed)
[place] [facility] [place] NAME: Rita Webb    MR#:  [id number]  DATE OF BIRTH:  [birth year]  DATE OF ADMISSION:  10/13/2019  PRIMARY CARE PHYSICIAN: Virginia Crews, MD   REQUESTING/REFERRING PHYSICIAN: Marjean Donna, MD  CHIEF COMPLAINT:   Chief Complaint  Patient presents with  . Leg Pain  . Leg Swelling    HISTORY OF PRESENT ILLNESS:  Rita Webb  is a 74 y.o. female with a known history of dyslipidemia oncology as well as a recently diagnosed right renal mass, presented to the emergency room with acute onset of worsening left leg pain and swelling.  Patient was diagnosed with DVT on Friday and was started on subcutaneous Lovenox in the spine that has been having significant worsening of symptoms as mentioned above.  She admitted to fever without chills this afternoon.  No chest pain or dyspnea or palpitations or cough or wheezing or hemoptysis.  No nausea or abdominal pain.  No bleeding diathesis.  Pain was graded as initially 8/10 in severity and later on improved to 2/10.Marland Kitchen  Upon presentation to the emergency room, temperature was 96.9 otherwise vital signs were within normal.  Labs revealed unremarkable CMP.  CBC showed leukocytosis and anemia.  Left lower extremity venous duplex revealed extensive occlusive left lower extremity DVT with proximal extension to the common femoral vein and overall progression of the clot burden since prior ultrasound.  The patient was given 2 mg of IV morphine sulfate and 4 mg of IV Zofran that brought the pain down from 8/10-2/10 and was started on IV heparin with a bolus then drip.  She will be admitted to a medical monitored bed for further evaluation and management. PAST MEDICAL HISTORY:   Past Medical History:  Diagnosis Date  . Allergy   . Hyperlipidemia     PAST SURGICAL HISTORY:   Past Surgical History:  Procedure Laterality Date  . GANGLION CYST EXCISION Right 1996   wrist  . MELANOMA EXCISION   08/2011  . SPINE SURGERY      SOCIAL HISTORY:   Social History   Tobacco Use  . Smoking status: Former Smoker    Quit date: 11/10/1968    Years since quitting: 50.9  . Smokeless tobacco: Never Used  Substance Use Topics  . Alcohol use: No    FAMILY HISTORY:   Family History  Problem Relation Age of Onset  . Breast cancer Mother 21  . Prostate cancer Father   . Diabetes Maternal Aunt   . Diabetes Maternal Uncle     DRUG ALLERGIES:   Allergies  Allergen Reactions  . Cefuroxime Diarrhea    REVIEW OF SYSTEMS:   ROS As per history of present illness. All pertinent systems were reviewed above. Constitutional,  HEENT, cardiovascular, respiratory, GI, GU, musculoskeletal, neuro, psychiatric, endocrine,  integumentary and hematologic systems were reviewed and are otherwise  negative/unremarkable except for positive findings mentioned above in the HPI.   MEDICATIONS AT HOME:   Prior to Admission medications   Medication Sig Start Date End Date Taking? Authorizing Provider  B Complex Vitamins (B COMPLEX 1 PO) Take 1 tablet by mouth daily.   Yes [provider]  B COMPLEX-C PO Take 1 tablet by mouth daily.  08/23/10  Yes [provider]  Calcium Carbonate-Vitamin D 600-200 MG-UNIT CAPS Take 2 capsules by mouth daily.  08/23/10  Yes [provider]  Cetirizine HCl 10 MG CAPS Take 1 capsule by mouth daily.  Yes [provider]  Cholecalciferol 1000 UNITS capsule Take 1,000 Units by mouth daily.  08/23/10  Yes [provider]  clonazePAM (KLONOPIN) 0.5 MG tablet Take 0.5-1 tablets (0.25-0.5 mg total) by mouth 2 (two) times daily as needed for anxiety. 10/08/19  Yes Bacigalupo, Dionne Bucy, MD  enoxaparin (LOVENOX) 80 MG/0.8ML injection Inject 0.8 mLs (80 mg total) into the skin daily for 7 days. 10/08/19 10/15/19 Yes Bacigalupo, Dionne Bucy, MD  fluticasone (FLONASE) 50 MCG/ACT nasal spray Place 1 spray into the nose daily.   Yes [provider]  montelukast (SINGULAIR) 10 MG tablet Take 10 mg by mouth at bedtime.  11/27/11  Yes [provider]  MULTIPLE VITAMIN PO Take 1 tablet by mouth daily.  08/23/10  Yes [provider]  Omega-3 Fatty Acids (FISH OIL) 1200 MG CAPS Take 1 capsule by mouth daily.  08/23/10  Yes [provider]  ondansetron (ZOFRAN) 4 MG tablet Take 1 tablet (4 mg total) by mouth every 8 (eight) hours as needed for nausea or vomiting. 09/29/19  Yes Bacigalupo, Dionne Bucy, MD  oxymetazoline [place] NASAL SPRAY FULL FORCE) 0.05 % nasal spray Place 1 spray into both nostrils 2 (two) times daily.   Yes [provider]  psyllium (REGULOID) 0.52 G capsule Take 1 capsule by mouth 3 (three) times daily.  08/23/10  Yes [provider]  sertraline (ZOLOFT) 50 MG tablet Take 1 tablet (50 mg total) by mouth daily. 09/29/19  Yes Bacigalupo, Dionne Bucy, MD  simvastatin (ZOCOR) 10 MG tablet TAKE ONE TABLET BY MOUTH AT BEDTIME 07/26/19  Yes Bacigalupo, Dionne Bucy, MD  UNABLE TO FIND Med Name: Allergy Shots once a week   Yes Beverly Gust, MD  apixaban (ELIQUIS) 5 MG TABS tablet Take 2 tablets (10 mg total) by mouth 2 (two) times daily for 7 days, THEN 1 tablet (5 mg total) 2 (two) times daily. 10/13/19 12/19/19  Flinchum, Kelby Aline, FNP      VITAL SIGNS:  Blood pressure (!) 108/48, pulse 84, temperature 99.6 F (37.6 C), temperature source Oral, resp. rate (!) 25, height 5\' 5"  (1.651 m), weight 54 kg, SpO2 95 %.  PHYSICAL EXAMINATION:  Physical Exam  GENERAL:  74 y.o.-year-old Caucasian female patient lying in the bed with no acute distress.  EYES: Pupils equal, round, reactive to light and accommodation. No scleral icterus. Extraocular muscles intact.  HEENT: Head atraumatic, normocephalic. Oropharynx and nasopharynx clear.  NECK:  Supple, no jugular venous distention. No thyroid enlargement, no tenderness.  LUNGS: Normal breath sounds bilaterally, no wheezing, rales,rhonchi or  crepitation. No use of accessory muscles of respiration.  CARDIOVASCULAR: Regular rate and rhythm, S1, S2 normal. No murmurs, rubs, or gallops.  ABDOMEN: Soft, nondistended, nontender. Bowel sounds present. No organomegaly or mass.  EXTREMITIES: Mild left leg swelling with negative Homans' sign with no cyanosis, or clubbing.  NEUROLOGIC: Cranial nerves II through XII are intact. Muscle strength 5/5 in all extremities. Sensation intact. Gait not checked.  PSYCHIATRIC: The patient is alert and oriented x 3.  Normal affect and good eye contact. SKIN: No obvious rash, lesion, or ulcer.   LABORATORY PANEL:   CBC Recent Labs  Lab 10/13/19 1913  WBC 12.7*  HGB 10.0*  HCT 29.6*  PLT 367   ------------------------------------------------------------------------------------------------------------------  Chemistries  Recent Labs  Lab 10/13/19 1913  NA 132*  K 3.8  CL 98  CO2 25  GLUCOSE 123*  BUN 12  CREATININE 0.51  CALCIUM 8.4*  AST 25  ALT 40  ALKPHOS 84  BILITOT 0.6   ------------------------------------------------------------------------------------------------------------------  Cardiac Enzymes No results for input(s): TROPONINI in the last 168 hours. ------------------------------------------------------------------------------------------------------------------  RADIOLOGY:  US Venous Img Lower Unilateral Left (DVT)  Result Date: 10/13/2019 CLINICAL DATA:  74 year old female with left lower extremity pain and swelling x1 day. Left lower extremity DVT seen on the recent ultrasound. EXAM: Left LOWER EXTREMITY VENOUS DOPPLER ULTRASOUND TECHNIQUE: Gray-scale sonography with graded compression, as well as color Doppler and duplex ultrasound were performed to evaluate the lower extremity deep venous systems from the level of the common femoral vein and including the common femoral, femoral, profunda femoral, popliteal and calf veins including the posterior tibial, peroneal and  gastrocnemius veins when visible. The superficial great saphenous vein was also interrogated. Spectral Doppler was utilized to evaluate flow at rest and with distal augmentation maneuvers in the common femoral, femoral and popliteal veins. COMPARISON:  Ultrasound dated 10/08/2019. FINDINGS: Contralateral Common Femoral Vein: Respiratory phasicity is normal and symmetric with the symptomatic side. No evidence of thrombus. Normal compressibility. Common Femoral Vein: Occlusive thrombus with distension and noncompression of the vessel. Saphenofemoral Junction: Occlusive thrombus with none compression. Profunda Femoral Vein: Occlusive thrombus with noncompression. Femoral Vein: Occlusive thrombus.  No flow or compression. Popliteal Vein: Occlusive thrombus with distension of the vessel and noncompression. Calf Veins: There is near complete occlusion of the peroneal vein. The posterior tibial vein appears patent. Superficial Great Saphenous Vein: Near complete occlusive thrombus through the great saphenous vein at the thigh. The visualized great saphenous vein is patent at the level of the knee. Venous Reflux:  None. Other Findings:  None. IMPRESSION: Extensive occlusive left lower extremity DVT with proximal extension to the common femoral vein. Overall progression of the clot burden since the prior ultrasound. These results were called by telephone at the time of interpretation on 10/13/2019 at 8:33 pm to provider Merlyn Lot , who verbally acknowledged these results. Electronically Signed   By: Anner Crete M.D.   On: 10/13/2019 20:38      IMPRESSION AND PLAN:  1.  Extensive left lower extremity DVT. -The patient will be admitted to a medical monitored bed. -Pain management will be provided. -We will continue her on IV heparin. -Hematology consultation will be obtained.  I notified Dr. Grayland Ormond about the patient.  2.  Right renal mass and calculus gallbladder. -She was expected to have surgical  excision for [facility] at [facility] on 4/8. -Pain management will be provided.  3.  Anxiety and depression -We will continue Klonopin and Zoloft.  4.  Allergic rhinitis. -Flonase will be resumed.  5.  Dyslipidemia. -Zocor will be resumed.  6.  DVT prophylaxis. -The patient will be on IV heparin as mentioned above.  All the records are reviewed and case discussed with ED provider. The plan of care was discussed in details with the patient (and family). I answered all questions. The patient agreed to proceed with the above mentioned plan. Further management will depend upon hospital course.   CODE STATUS: Full code  TOTAL TIME TAKING CARE OF THIS PATIENT: 50 minutes.    Christel Mormon M.D on 10/14/2019 at 1:54 AM  [facility]   From 7 PM-7 AM, contact night-coverage [email or web address]  CC: Primary care physician; Virginia Crews, MD   Note: This dictation was prepared with Dragon dictation along with smaller phrase technology. Any transcriptional errors that result from this process are unintentional.

## 2019-10-15 ENCOUNTER — Encounter: Payer: Self-pay | Admitting: Cardiology

## 2019-10-15 ENCOUNTER — Telehealth: Payer: Self-pay

## 2019-10-15 LAB — URINALYSIS, ROUTINE W REFLEX MICROSCOPIC
Bilirubin Urine: NEGATIVE
Glucose, UA: NEGATIVE mg/dL
Hgb urine dipstick: NEGATIVE
Ketones, ur: 20 mg/dL — AB
Leukocytes,Ua: NEGATIVE
Nitrite: NEGATIVE
Protein, ur: NEGATIVE mg/dL
Specific Gravity, Urine: 1.013 (ref 1.005–1.030)
pH: 6 (ref 5.0–8.0)

## 2019-10-15 LAB — BASIC METABOLIC PANEL
Anion gap: 10 (ref 5–15)
BUN: 14 mg/dL (ref 8–23)
CO2: 21 mmol/L — ABNORMAL LOW (ref 22–32)
Calcium: 8.1 mg/dL — ABNORMAL LOW (ref 8.9–10.3)
Chloride: 103 mmol/L (ref 98–111)
Creatinine, Ser: 0.5 mg/dL (ref 0.44–1.00)
GFR calc Af Amer: >60 mL/min (ref >60)
GFR calc non Af Amer: >60 mL/min (ref >60)
Glucose, Bld: 109 mg/dL — ABNORMAL HIGH (ref 70–99)
Potassium: 4 mmol/L (ref 3.5–5.1)
Sodium: 134 mmol/L — ABNORMAL LOW (ref 135–145)

## 2019-10-15 LAB — HEPARIN LEVEL (UNFRACTIONATED): Heparin Unfractionated: 0.21 IU/mL — ABNORMAL LOW (ref 0.30–0.70)

## 2019-10-15 LAB — CBC
HCT: 25 % — ABNORMAL LOW (ref 36.0–46.0)
Hemoglobin: 8.1 g/dL — ABNORMAL LOW (ref 12.0–15.0)
MCH: 30.2 pg (ref 26.0–34.0)
MCHC: 32.4 g/dL (ref 30.0–36.0)
MCV: 93.3 fL (ref 80.0–100.0)
Platelets: 343 10*3/uL (ref 150–400)
RBC: 2.68 MIL/uL — ABNORMAL LOW (ref 3.87–5.11)
RDW: 13.4 % (ref 11.5–15.5)
WBC: 12.4 10*3/uL — ABNORMAL HIGH (ref 4.0–10.5)
nRBC: 0 % (ref 0.0–0.2)

## 2019-10-15 LAB — APTT: aPTT: 51 seconds — ABNORMAL HIGH (ref 24–36)

## 2019-10-15 MED ORDER — OXYCODONE HCL 5 MG PO TABS: 5.0000 mg | ORAL_TABLET | Freq: Four times a day (QID) | ORAL | 0 refills | Status: DC | PRN

## 2019-10-15 MED ORDER — SULFAMETHOXAZOLE-TRIMETHOPRIM 800-160 MG PO TABS
1.0000 | ORAL_TABLET | Freq: Two times a day (BID) | ORAL | Status: DC
  Administered 2019-10-15: 11:00:00 1 via ORAL
  Filled 2019-10-15 (×2): qty 1

## 2019-10-15 MED ORDER — APIXABAN 5 MG PO TABS: ORAL_TABLET | ORAL | 0 refills | Status: DC

## 2019-10-15 MED ORDER — APIXABAN 5 MG PO TABS: 5.0000 mg | ORAL_TABLET | Freq: Two times a day (BID) | ORAL | Status: DC

## 2019-10-15 MED ORDER — SULFAMETHOXAZOLE-TRIMETHOPRIM 400-80 MG PO TABS: 2.0000 | ORAL_TABLET | Freq: Two times a day (BID) | ORAL | 0 refills | Status: DC

## 2019-10-15 MED ORDER — APIXABAN 5 MG PO TABS
10.0000 mg | ORAL_TABLET | Freq: Two times a day (BID) | ORAL | Status: DC
  Administered 2019-10-15: 10:00:00 10 mg via ORAL
  Filled 2019-10-15: qty 2

## 2019-10-15 NOTE — Telephone Encounter (Signed)
Copied from [place] [phone]. Topic: General - Other >> Oct 15, 2019 10:42 AM Celene Kras wrote: Reason for CRM: [facility] called requesting a hospital f/u for pt with PCP next week. Pcp does not have a hosp f/u until 11/03/19. [facility] is requesting to have pt contacted to get her scheduled for an earlier appt. Please advise.

## 2019-10-15 NOTE — Telephone Encounter (Signed)
Looking at Dr. Brita Romp schedule next week I don't see any opening and Im unsure if she is okay with being double booked. Please review over schedule with provider and advise patient/ KW

## 2019-10-15 NOTE — Progress Notes (Signed)
ANTICOAGULATION CONSULT NOTE  Pharmacy Consult for heparin Indication: DVT  Patient Measurements: Height: 5\' 5"  (165.1 cm) Weight: 54 kg (119 lb 0.8 oz) IBW/kg (Calculated) : 57 Heparin Dosing Weight: 54 kg  Vital Signs: Temp: 98.6 F (37 C) (04/08 2339) Temp Source: Oral (04/08 2339) BP: 106/34 (04/08 2339) Pulse Rate: 76 (04/08 2339)  Labs: Recent Labs    10/13/19 1913 10/13/19 1913 10/13/19 2343 10/13/19 2353 10/14/19 0544 10/15/19 0053  HGB 10.0*   < >  --   --  9.4* 8.1*  HCT 29.6*  --   --   --  28.3* 25.0*  PLT 367  --   --   --  356 343  APTT  --   --  45*  --  58* 51*  LABPROT  --   --  15.6*  --   --   --   INR  --   --  1.3*  --   --   --   HEPARINUNFRC  --   --   --  0.88*  --  0.21*  CREATININE 0.51  --   --   --  0.45 0.50   < > = values in this interval not displayed.    Estimated Creatinine Clearance: 54.2 mL/min (by C-G formula based on SCr of 0.5 mg/dL).   Medications:  Scheduled:    Assessment: Patient was going to [facility] hematology, but arrives with pain and swelling of left leg pain w/ extensive occlusive DVT on [place] was planned to start eliquis PTA after being on lovenox. Patient was started on heparin drip for extensive occlusive DVT. H&H trending down, platelets wnl and stable  Goal of Therapy:  Heparin level 0.3-0.7 units/ml aPTT 66 - 102s Monitor platelets by anticoagulation protocol: Yes   Plan:  04/09 0000 aPTT 51 seconds subtherapeutic. Will increase rate to 1200 units/hr and will recheck aPTT at 1000, CBC is dropping but is s/p IVC filter placement, will continue to monitor.  Tobie Lords, PharmD, BCPS Clinical Pharmacist 10/15/2019,3:49 AM

## 2019-10-15 NOTE — Care Management Important Message (Signed)
Important Message  Patient Details  Name: Rita Webb MRN: [id number] Date of Birth: January 28, 1946   Medicare Important Message Given:  N/A - LOS <3 / Initial given by admissions     [name] A Aisa Schoeppner 10/15/2019, 7:27 AM

## 2019-10-15 NOTE — Progress Notes (Addendum)
[facility] Daily Progress Note  Subjective: 1. US guidance for vascular access to right femoral vein and left popliteal vein 2. Catheter placement into IVC from right femoral vein approach and into the left common iliac vein from the left popliteal approach 3. IVC gram and left lower extremity venogram 4. IVC filter placement 5.   Catheter directed thrombolysis with 6 mg of TPA to the left iliac veins, common femoral vein, and superficial femoral vein 6. Mechanical thrombectomy to the left common and external iliac veins, common femoral vein, superficial femoral vein, and popliteal vein with the penumbra CAT 12 device 7. PTA of left superficial femoral, common femoral, external iliac, and common iliac veins with 10 mm balloon 8. Stent placement of the left common and external iliac veins with 16 mm diameter by 10 cm length VeNovo stent  Patient seen and examined with daughter at bedside.  No complaints.  Notes improvement to her left lower extremity.  No issues overnight.  Objective: Vitals:   10/14/19 1704 10/14/19 1707 10/14/19 2339 10/15/19 0739  BP: (!) 106/23 (!) 106/46 (!) 106/34 (!) 136/40  Pulse:  80 76 99  Resp:  20 (!) 21 18  Temp:  (!) 100.5 F (38.1 C) 98.6 F (37 C) 98.7 F (37.1 C)  TempSrc:  Axillary Oral Oral  SpO2:  99% 98% 95%  Weight:      Height:        Intake/Output Summary (Last 24 hours) at 10/15/2019 1050 Last data filed at 10/15/2019 1038 Gross per 24 hour  Intake 279.77 ml  Output --  Net 279.77 ml   Physical Exam: A&Ox3, NAD CV: RRR Pulmonary: CTA Bilaterally Abdomen: Soft, Nontender, Nondistended Vascular: Right Groin:  Access Site: PAD removed.  No swelling or drainage noted. Left Lower Extremity:   Thigh soft.  Calf soft.  Extremities warm distally toes.  Compression wrap intact clean and dry.     Document Information Photos    10/14/2019 14:53  Attached To:  Hospital Encounter on 10/13/19  Source  Information Leonides Schanz  [facility]   Laboratory: CBC    Component Value Date/Time   WBC 12.4 (H) 10/15/2019 0053   HGB 8.1 (L) 10/15/2019 0053   HGB 13.1 02/18/2019 1024   HCT 25.0 (L) 10/15/2019 0053   HCT 39.4 02/18/2019 1024   PLT 343 10/15/2019 0053   PLT 287 02/18/2019 1024   BMET    Component Value Date/Time   NA 134 (L) 10/15/2019 0053   NA 143 02/18/2019 1024   K 4.0 10/15/2019 0053   CL 103 10/15/2019 0053   CO2 21 (L) 10/15/2019 0053   GLUCOSE 109 (H) 10/15/2019 0053   BUN 14 10/15/2019 0053   BUN 14 02/18/2019 1024   CREATININE 0.50 10/15/2019 0053   CALCIUM 8.1 (L) 10/15/2019 0053   GFRNONAA >60 10/15/2019 0053   GFRAA >60 10/15/2019 0053   Assessment/Planning: The patient is a 74 year old female who presented with extensive left lower extremity DVT now status post venous thrombectomy/venous lysis - POD#1 1) Patient transition from IV heparin to Eliquis this AM. 2) We will leave the compression bandage for another day.  Daughter was given instructions to take it off tomorrow.  Patient should continue to elevate her leg 3) Patient can shower as of Saturday - this will discussed with the patient and her daughter at the bedside.  Discussed with Dr. Ellis Parents Fatumata Kashani PA-C 10/15/2019 10:50 AM

## 2019-10-15 NOTE — Discharge Instructions (Signed)
Information on my medicine - ELIQUIS (apixaban)  Why was Eliquis prescribed for you? Eliquis was prescribed to treat blood clots that may have been found in the veins of your legs (deep vein thrombosis) or in your lungs (pulmonary embolism) and to reduce the risk of them occurring again.  What do You need to know about Eliquis ? The starting dose is 10 mg (two 5 mg tablets) taken TWICE daily for the FIRST SEVEN (7) DAYS, then on 10/22/19  the dose is reduced to ONE 5 mg tablet taken TWICE daily.  Eliquis may be taken with or without food.   Try to take the dose about the same time in the morning and in the evening. If you have difficulty swallowing the tablet whole please discuss with your pharmacist how to take the medication safely.  Take Eliquis exactly as prescribed and DO NOT stop taking Eliquis without talking to the doctor who prescribed the medication.  Stopping may increase your risk of developing a new blood clot.  Refill your prescription before you run out.  After discharge, you should have regular check-up appointments with your healthcare provider that is prescribing your Eliquis.    What do you do if you miss a dose? If a dose of ELIQUIS is not taken at the scheduled time, take it as soon as possible on the same day and twice-daily administration should be resumed. The dose should not be doubled to make up for a missed dose.  Important Safety Information A possible side effect of Eliquis is bleeding. You should call your healthcare provider right away if you experience any of the following: ? Bleeding from an injury or your nose that does not stop. ? Unusual colored urine (red or dark brown) or unusual colored stools (red or black). ? Unusual bruising for unknown reasons. ? A serious fall or if you hit your head (even if there is no bleeding).  Some medicines may interact with Eliquis and might increase your risk of bleeding or clotting while on Eliquis. To help  avoid this, consult your healthcare provider or pharmacist prior to using any new prescription or non-prescription medications, including herbals, vitamins, non-steroidal anti-inflammatory drugs (NSAIDs) and supplements.  This website has more information on Eliquis (apixaban): http://www.eliquis.com/eliquis/home

## 2019-10-15 NOTE — Discharge Summary (Signed)
Physician Discharge Summary  Rita Webb ZOX:[id number] DOB: June 22, 1946 DOA: 10/13/2019  PCP: Virginia Crews, MD  Admit date: 10/13/2019 Discharge date: 10/15/2019  Admitted From: Home  Disposition:  Home   Recommendations for Outpatient Follow-up:  1. Follow up with Dr. Lucky Cowboy vascular Surgery as directed 2. Follow up with PCP in 1 week  3. Please obtain CBC in one week 4. Dr. Salley Scarlet: Please follow up urine culture at outpatient follow up      Home Health: None  Equipment/Devices: None  Discharge Condition: Good  CODE STATUS: FULL Diet recommendation: Cardiac  Brief/Interim Summary: Rita Webb is a 74 y.o. F with hx renal mass, pending resection and recent DVT left leg who presented with severe left leg pain, redness and swelling despite Lovenox.  In the ER, the patient had left lower extremity Doppler that showed extensive clot burden with progression since prior ultrasound.  She required IV morphine for pain control, and was started on a heparin drip.     PRINCIPAL HOSPITAL DIAGNOSIS: Left lower extremity DVT    Discharge Diagnoses:  Extensive left lower extremity DVT Patient was started on heparin drip, vascular surgery were consulted for debulking to prevent post DVT thrombophlebitis.  They performed thrombectomy, and stent placement, as well as venogram.  Postoperatively the patient had no complications, was transitioned to Eliquis, and was comfortable for discharge home.  Follow-up with her urologist at [facility] for plans regarding timing of resection.   Right renal mass Cholelithiasis Followed by urology at [facility].  Anxiety Continue Zoloft  UTI Developed mild fever overnight.  Urinalysis normal, but patient describing urinary discomfort and frequency. Started on Bactrim for 5 days           Discharge Instructions  Discharge Instructions    Discharge instructions   Complete by: As directed    From Dr. Loleta Books: You were admitted  for a blood clot in the leg.   Because it was large, Dr. Lucky Cowboy removed as much clot as he could, and placed a stent to allow the blood to drain.  This should gradually improve the pain and swelling. Use oxycodone or acetaminophen as needed to help the pain.  Stop the Lovenox shots. Take the alternative Oral blood thinner apixaban/Eliquis--> Take apixaban/Eliquis 10 mg (2 tabs) twice daily for 1 week Next Friday, Apr 16: Start taking apixaban/Eliquis 5 mg (1 tab) twice daily   Take this until directed to stop by your doctor (either your primary care or Dr. Grayland Ormond or Dr. Voncille Lo).  For the bladder infection: Take Bactrim 800-160 mg (2 tabs) twice daily for 5 days   Follow up with your primary care doctor early next week  Follow up with Dr. Voncille Lo   Increase activity slowly   Complete by: As directed      Allergies as of 10/15/2019      Reactions   Cefuroxime Diarrhea      Medication List    STOP taking these medications   enoxaparin 80 MG/0.8ML injection Commonly known as: Lovenox     TAKE these medications   apixaban 5 MG Tabs tablet Commonly known as: ELIQUIS Take apixaban/Eliquis 10 mg (2 tabs) twice daily for 7 days, then take 5 mg (1 tab) twice daily What changed: See the new instructions.   B COMPLEX 1 PO Take 1 tablet by mouth daily.   B COMPLEX-C PO Take 1 tablet by mouth daily.   Calcium Carbonate-Vitamin D 600-200 MG-UNIT Caps Take 2 capsules by mouth daily.  Cetirizine HCl 10 MG Caps Take 1 capsule by mouth daily.   Cholecalciferol 25 MCG (1000 UT) capsule Take 1,000 Units by mouth daily.   clonazePAM 0.5 MG tablet Commonly known as: KlonoPIN Take 0.5-1 tablets (0.25-0.5 mg total) by mouth 2 (two) times daily as needed for anxiety.   Fish Oil 1200 MG Caps Take 1 capsule by mouth daily.   fluticasone 50 MCG/ACT nasal spray Commonly known as: FLONASE Place 1 spray into the nose daily.   montelukast 10 MG tablet Commonly known as:  SINGULAIR Take 10 mg by mouth at bedtime.   Mucinex Nasal Spray Full Force 0.05 % nasal spray Generic drug: oxymetazoline Place 1 spray into both nostrils 2 (two) times daily.   MULTIPLE VITAMIN PO Take 1 tablet by mouth daily.   ondansetron 4 MG tablet Commonly known as: Zofran Take 1 tablet (4 mg total) by mouth every 8 (eight) hours as needed for nausea or vomiting.   oxyCODONE 5 MG immediate release tablet Commonly known as: Oxy IR/ROXICODONE Take 1 tablet (5 mg total) by mouth every 6 (six) hours as needed for moderate pain.   psyllium 0.52 g capsule Commonly known as: REGULOID Take 1 capsule by mouth 3 (three) times daily.   sertraline 50 MG tablet Commonly known as: ZOLOFT Take 1 tablet (50 mg total) by mouth daily.   simvastatin 10 MG tablet Commonly known as: ZOCOR TAKE ONE TABLET BY MOUTH AT BEDTIME   sulfamethoxazole-trimethoprim 400-80 MG tablet Commonly known as: BACTRIM Take 2 tablets by mouth 2 (two) times daily for 5 days.   UNABLE TO FIND Med Name: Allergy Shots once a week      Follow-up Information    Dew, Erskine Squibb, MD Follow up in 2 week(s).   Specialties: Vascular Surgery, Radiology, Interventional Cardiology Why: Seen as consult. Can see Dew or Arna Medici. Will need left lower extremity DVT with visit.  Contact information: [place] [address] [phone]        Virginia Crews, MD. Schedule an appointment as soon as possible for a visit in 1 week(s).   Specialty: Family Medicine Contact information: [address] [place] [address] [phone]          Allergies  Allergen Reactions  . Cefuroxime Diarrhea    Consultations:  Vascular Surgery  Oncology/hematology   Procedures/Studies: PERIPHERAL VASCULAR CATHETERIZATION  Result Date: 10/14/2019 See op note  US Venous Img Lower Unilateral Left (DVT)  Result Date: 10/13/2019 CLINICAL DATA:  74 year old female with left lower extremity  pain and swelling x1 day. Left lower extremity DVT seen on the recent ultrasound. EXAM: Left LOWER EXTREMITY VENOUS DOPPLER ULTRASOUND TECHNIQUE: Gray-scale sonography with graded compression, as well as color Doppler and duplex ultrasound were performed to evaluate the lower extremity deep venous systems from the level of the common femoral vein and including the common femoral, femoral, profunda femoral, popliteal and calf veins including the posterior tibial, peroneal and gastrocnemius veins when visible. The superficial great saphenous vein was also interrogated. Spectral Doppler was utilized to evaluate flow at rest and with distal augmentation maneuvers in the common femoral, femoral and popliteal veins. COMPARISON:  Ultrasound dated 10/08/2019. FINDINGS: Contralateral Common Femoral Vein: Respiratory phasicity is normal and symmetric with the symptomatic side. No evidence of thrombus. Normal compressibility. Common Femoral Vein: Occlusive thrombus with distension and noncompression of the vessel. Saphenofemoral Junction: Occlusive thrombus with none compression. Profunda Femoral Vein: Occlusive thrombus with noncompression. Femoral Vein: Occlusive thrombus.  No  flow or compression. Popliteal Vein: Occlusive thrombus with distension of the vessel and noncompression. Calf Veins: There is near complete occlusion of the peroneal vein. The posterior tibial vein appears patent. Superficial Great Saphenous Vein: Near complete occlusive thrombus through the great saphenous vein at the thigh. The visualized great saphenous vein is patent at the level of the knee. Venous Reflux:  None. Other Findings:  None. IMPRESSION: Extensive occlusive left lower extremity DVT with proximal extension to the common femoral vein. Overall progression of the clot burden since the prior ultrasound. These results were called by telephone at the time of interpretation on 10/13/2019 at 8:33 pm to provider Merlyn Lot , who verbally  acknowledged these results. Electronically Signed   By: Anner Crete M.D.   On: 10/13/2019 20:38   US Venous Img Lower Unilateral Left  Result Date: 10/08/2019 CLINICAL DATA:  Renal cell carcinoma, superficial thrombophlebitis left lower extremity EXAM: LEFT LOWER EXTREMITY VENOUS DOPPLER ULTRASOUND TECHNIQUE: Gray-scale sonography with graded compression, as well as color Doppler and duplex ultrasound were performed to evaluate the lower extremity deep venous systems from the level of the common femoral vein and including the common femoral, femoral, profunda femoral, popliteal and calf veins including the posterior tibial, peroneal and gastrocnemius veins when visible. The superficial great saphenous vein was also interrogated. Spectral Doppler was utilized to evaluate flow at rest and with distal augmentation maneuvers in the common femoral, femoral and popliteal veins. COMPARISON:  None. FINDINGS: Contralateral Common Femoral Vein: Respiratory phasicity is normal and symmetric with the symptomatic side. No evidence of thrombus. Normal compressibility. Common Femoral Vein: There is occlusive thrombus within the left common femoral vein extending to the saphenous junction. Saphenofemoral Junction: Occlusive thrombus at the saphenofemoral junction. Profunda Femoral Vein: No evidence of thrombus. Normal compressibility and flow on color Doppler imaging. Femoral Vein: No evidence of thrombus. Normal compressibility, respiratory phasicity and response to augmentation. Popliteal Vein: No evidence of thrombus. Normal compressibility, respiratory phasicity and response to augmentation. Calf Veins: No evidence of thrombus. Normal compressibility and flow on color Doppler imaging. Varicosities are identified at the site of palpable abnormality. Superficial Great Saphenous Vein: There is occlusive thrombus extending from the proximal through the distal thigh. Other Findings: There is thrombus within the distal margin  of the left external iliac vein extending into the common femoral vein. The proximal aspect of the external iliac vein is not well visualized, but the IVC is patent. IMPRESSION: 1. Deep venous thrombosis extending from the distal aspect of the left external iliac vein through the level of the common femoral vein at the saphenofemoral junction. 2. Superficial thrombosis of the left greater saphenous vein from the proximal through the distal thigh. 3. Patent calf veins, though superficial varicosities are identified at the site of palpable abnormality. Electronically Signed   By: Randa Ngo M.D.   On: 10/08/2019 16:19       Subjective: Patient's pain is somewhat better, there is still some swelling but she has delayed graft and so this is improved.  No confusion, vomiting.  She has had some dysuria and urinary frequency.  Discharge Exam: Vitals:   10/14/19 2339 10/15/19 0739  BP: (!) 106/34 (!) 136/40  Pulse: 76 99  Resp: (!) 21 18  Temp: 98.6 F (37 C) 98.7 F (37.1 C)  SpO2: 98% 95%   Vitals:   10/14/19 1704 10/14/19 1707 10/14/19 2339 10/15/19 0739  BP: (!) 106/23 (!) 106/46 (!) 106/34 (!) 136/40  Pulse:  80 76 99  Resp:  20 (!) 21 18  Temp:  (!) 100.5 F (38.1 C) 98.6 F (37 C) 98.7 F (37.1 C)  TempSrc:  Axillary Oral Oral  SpO2:  99% 98% 95%  Weight:      Height:        General: Pt is alert, awake, not in acute distress Cardiovascular: RRR, nl S1-S2, no murmurs appreciated.   No LE edema.   Respiratory: Normal respiratory rate and rhythm.  CTAB without rales or wheezes. Abdominal: Abdomen soft and non-tender.  No distension or HSM.   Neuro/Psych: Strength symmetric in upper and lower extremities.  Judgment and insight appear normal. MSK: The left leg is wrapped, but still mildly swollen.     The results of significant diagnostics from this hospitalization (including imaging, microbiology, ancillary and laboratory) are listed below for reference.      Microbiology: Recent Results (from the past 240 hour(s))  Respiratory Panel by RT PCR (Flu A&B, Covid) - Nasopharyngeal Swab     Status: None   Collection Time: 10/14/19  9:38 AM   Specimen: Nasopharyngeal Swab  Result Value Ref Range Status   SARS Coronavirus 2 by RT PCR NEGATIVE NEGATIVE Final    Comment: (NOTE) SARS-CoV-2 target nucleic acids are NOT DETECTED. The SARS-CoV-2 RNA is generally detectable in upper respiratoy specimens during the acute phase of infection. The lowest concentration of SARS-CoV-2 viral copies this assay can detect is 131 copies/mL. A negative result does not preclude SARS-Cov-2 infection and should not be used as the sole basis for treatment or other patient management decisions. A negative result may occur with  improper specimen collection/handling, submission of specimen other than nasopharyngeal swab, presence of viral mutation(s) within the areas targeted by this assay, and inadequate number of viral copies (<131 copies/mL). A negative result must be combined with clinical observations, patient history, and epidemiological information. The expected result is Negative. Fact Sheet for Patients:  PinkCheek.be Fact Sheet for Healthcare Providers:  GravelBags.it This test is not yet ap proved or cleared by the [place] FDA and  has been authorized for detection and/or diagnosis of SARS-CoV-2 by FDA under an Emergency Use Authorization (EUA). This EUA will remain  in effect (meaning this test can be used) for the duration of the COVID-19 declaration under Section 564(b)(1) of the Act, 21 U.S.C. section 360bbb-3(b)(1), unless the authorization is terminated or revoked sooner.    Influenza A by PCR NEGATIVE NEGATIVE Final   Influenza B by PCR NEGATIVE NEGATIVE Final    Comment: (NOTE) The Xpert Xpress SARS-CoV-2/FLU/RSV assay is intended as an aid in  the diagnosis of influenza from  Nasopharyngeal swab specimens and  should not be used as a sole basis for treatment. Nasal washings and  aspirates are unacceptable for Xpert Xpress SARS-CoV-2/FLU/RSV  testing. Fact Sheet for Patients: PinkCheek.be Fact Sheet for Healthcare Providers: GravelBags.it This test is not yet approved or cleared by the [place] FDA and  has been authorized for detection and/or diagnosis of SARS-CoV-2 by  FDA under an Emergency Use Authorization (EUA). This EUA will remain  in effect (meaning this test can be used) for the duration of the  Covid-19 declaration under Section 564(b)(1) of the Act, 21  U.S.C. section 360bbb-3(b)(1), unless the authorization is  terminated or revoked. Performed at [facility], [address] [place], [place] [address]      Labs: BNP (last 3 results) No results for input(s): BNP in the last 8760 hours. Basic Metabolic Panel: Recent Labs  Lab 10/13/19 1913 10/14/19 0544 10/15/19 0053  NA 132* 132* 134*  K 3.8 3.9 4.0  CL 98 99 103  CO2 25 27 21*  GLUCOSE 123* 117* 109*  BUN 12 13 14   CREATININE 0.51 0.45 0.50  CALCIUM 8.4* 8.2* 8.1*   Liver Function Tests: Recent Labs  Lab 10/13/19 1913  AST 25  ALT 40  ALKPHOS 84  BILITOT 0.6  PROT 6.4*  ALBUMIN 2.8*   No results for input(s): LIPASE, AMYLASE in the last 168 hours. No results for input(s): AMMONIA in the last 168 hours. CBC: Recent Labs  Lab 10/13/19 1913 10/14/19 0544 10/15/19 0053  WBC 12.7* 12.5* 12.4*  HGB 10.0* 9.4* 8.1*  HCT 29.6* 28.3* 25.0*  MCV 90.8 92.8 93.3  PLT 367 356 343   Cardiac Enzymes: No results for input(s): CKTOTAL, CKMB, CKMBINDEX, TROPONINI in the last 168 hours. BNP: Invalid input(s): POCBNP CBG: No results for input(s): GLUCAP in the last 168 hours. D-Dimer No results for input(s): DDIMER in the last 72 hours. Hgb A1c No results for input(s): HGBA1C in the last 72  hours. Lipid Profile No results for input(s): CHOL, HDL, LDLCALC, TRIG, CHOLHDL, LDLDIRECT in the last 72 hours. Thyroid function studies No results for input(s): TSH, T4TOTAL, T3FREE, THYROIDAB in the last 72 hours.  Invalid input(s): FREET3 Anemia work up No results for input(s): VITAMINB12, FOLATE, FERRITIN, TIBC, IRON, RETICCTPCT in the last 72 hours. Urinalysis No results found for: COLORURINE, APPEARANCEUR, [place], [place], [place], [place], [place], [place], PROTEINUR, UROBILINOGEN, NITRITE, LEUKOCYTESUR Sepsis Labs Invalid input(s): PROCALCITONIN,  WBC,  LACTICIDVEN Microbiology Recent Results (from the past 240 hour(s))  Respiratory Panel by RT PCR (Flu A&B, Covid) - Nasopharyngeal Swab     Status: None   Collection Time: 10/14/19  9:38 AM   Specimen: Nasopharyngeal Swab  Result Value Ref Range Status   SARS Coronavirus 2 by RT PCR NEGATIVE NEGATIVE Final    Comment: (NOTE) SARS-CoV-2 target nucleic acids are NOT DETECTED. The SARS-CoV-2 RNA is generally detectable in upper respiratoy specimens during the acute phase of infection. The lowest concentration of SARS-CoV-2 viral copies this assay can detect is 131 copies/mL. A negative result does not preclude SARS-Cov-2 infection and should not be used as the sole basis for treatment or other patient management decisions. A negative result may occur with  improper specimen collection/handling, submission of specimen other than nasopharyngeal swab, presence of viral mutation(s) within the areas targeted by this assay, and inadequate number of viral copies (<131 copies/mL). A negative result must be combined with clinical observations, patient history, and epidemiological information. The expected result is Negative. Fact Sheet for Patients:  PinkCheek.be Fact Sheet for Healthcare Providers:  GravelBags.it This test is not yet ap proved or cleared by the [place] FDA and  has been authorized for detection and/or diagnosis of SARS-CoV-2 by FDA under an Emergency Use Authorization (EUA). This EUA will remain  in effect (meaning this test can be used) for the duration of the COVID-19 declaration under Section 564(b)(1) of the Act, 21 U.S.C. section 360bbb-3(b)(1), unless the authorization is terminated or revoked sooner.    Influenza A by PCR NEGATIVE NEGATIVE Final   Influenza B by PCR NEGATIVE NEGATIVE Final    Comment: (NOTE) The Xpert Xpress SARS-CoV-2/FLU/RSV assay is intended as an aid in  the diagnosis of influenza from Nasopharyngeal swab specimens and  should not be used as a sole basis for treatment. Nasal washings and  aspirates are unacceptable for Xpert Xpress  SARS-CoV-2/FLU/RSV  testing. Fact Sheet for Patients: PinkCheek.be Fact Sheet for Healthcare Providers: GravelBags.it This test is not yet approved or cleared by the [place] FDA and  has been authorized for detection and/or diagnosis of SARS-CoV-2 by  FDA under an Emergency Use Authorization (EUA). This EUA will remain  in effect (meaning this test can be used) for the duration of the  Covid-19 declaration under Section 564(b)(1) of the Act, 21  U.S.C. section 360bbb-3(b)(1), unless the authorization is  terminated or revoked. Performed at [facility], [address] [place], [place] [address]      Time coordinating discharge: 35 minutes The [place] controlled substances registry was reviewed for this patient prior to filling the <5 days supply controlled substances script.      SIGNED:   Edwin Dada, MD  [facility] 10/15/2019, 10:15 AM

## 2019-10-16 LAB — URINE CULTURE: Culture: <10000 — AB

## 2019-10-18 ENCOUNTER — Telehealth: Payer: Self-pay

## 2019-10-18 NOTE — Telephone Encounter (Signed)
Looks like someone scheduled for 4/14

## 2019-10-18 NOTE — Telephone Encounter (Signed)
Transition Care Management Follow-up Telephone Call  Date of discharge and from where: [facility] on 10/15/19.  How have you been since you were released from the hospital? Doing ok but still very weak and the left foot is still swollen. There is redness at top of left leg. Pt currently has dull pain in her upper inner left thigh. Pt has been taking Tylenol to help with the pain. Pt has a bandage behind her left knee but has not removed the bandage to evaluate. Declined any other pains, fever or n/v/d.  Any questions or concerns? No   Items Reviewed:  Did the pt receive and understand the discharge instructions provided? Yes   Medications obtained and verified? Yes   Any new allergies since your discharge? No   Dietary orders reviewed? Yes  Do you have support at home? Yes   Other (ie: DME, Home Health, etc): N/A   Functional Questionnaire: (I = Independent and D = Dependent)  Bathing/Dressing- I, currently has assistance from husband.   Meal Prep- D currently.  Eating- I  Maintaining continence- I  Transferring/Ambulation- I  Managing Meds- D, currently being managed by daughters.   Follow up appointments reviewed:    PCP Hospital f/u appt confirmed? Yes  Scheduled to see Dr Brita Romp on 10/20/19 @ 10:00 AM.  [facility] f/u appt confirmed? Yes    Are transportation arrangements needed? No   If their condition worsens, is the pt aware to call  their PCP or go to the ED? Yes  Was the patient provided with contact information for the PCP's office or ED? Yes  Was the pt encouraged to call back with questions or concerns? Yes

## 2019-10-18 NOTE — Telephone Encounter (Signed)
Called and confirmed with patient about her scheduled appointment on 10/20/19. She gave verbal understanding.

## 2019-10-18 NOTE — Telephone Encounter (Signed)
Called and informed patient of her negative urine culture. She stated that the hospital placed her on abx and she was advised that she could stop the abx or if she has been improving she can finish the course. She gave verbal understanding.

## 2019-10-18 NOTE — Telephone Encounter (Signed)
HFU scheduled 11/03/19 and virtual 1 month f/u scheduled for 11/01/19.

## 2019-10-19 ENCOUNTER — Ambulatory Visit: Payer: Medicare Other | Admitting: Urology

## 2019-10-19 NOTE — Progress Notes (Signed)
Established patient visit     Patient: Rita Webb   DOB: Dec 15, 1945   74 y.o. Female  MRN: [id number] Visit Date: 10/20/2019  Today's healthcare provider: Lavon Paganini, MD  Subjective:    Chief Complaint  Patient presents with  . Hospitalization Follow-up    Treated for left lower extremity DVT.    I, Lilia Argue as a scribe for Lavon Paganini, MD.,have documented all relevant documentation on the behalf of Lavon Paganini, MD,as directed by  Lavon Paganini, MD while in the presence of Lavon Paganini, MD.  HPI Follow up Hospitalization Patient was admitted to [facility] on 10/13/2019 and discharged on 10/15/2019. She was treated for left lower extremity DVT. Treatment for this includes lower extremity Doppler that showed extensive clot. Patient was started on IV morphine for pain control, and was started on a heparin drip. Vascular surgery performed thrombectomy, and stent placed, as well as venogram. Patient was started on Eliquis. Patient advised to have CBC rechecked in one week. Telephone follow up was done on 10/18/2019. She reports good compliance with treatment. She reports this condition has  Right renal mass follow up And Choletithiasis Followed by urology at [facility].  Anxiety follow up Continue Zoloft.  UTI follow up Urinalysis normal, patient had urinary discomfort and frequency. Started on Bactrim for 5 days. Urine culture with no true UTI  Social History   Tobacco Use  . Smoking status: Former Smoker    Quit date: 11/10/1968    Years since quitting: 50.9  . Smokeless tobacco: Never Used  Substance Use Topics  . Alcohol use: No  . Drug use: No       Medications: Outpatient Medications Prior to Visit  Medication Sig  . acetaminophen (TYLENOL) 500 MG tablet Take 500 mg by mouth every 6 (six) hours as needed.  Marland Kitchen apixaban (ELIQUIS) 5 MG TABS tablet Take apixaban/Eliquis 10 mg (2 tabs) twice daily for 7 days, then take 5 mg (1  tab) twice daily  . B Complex Vitamins (B COMPLEX 1 PO) Take 1 tablet by mouth daily.  . B COMPLEX-C PO Take 1 tablet by mouth daily.   . Calcium Carbonate-Vitamin D 600-200 MG-UNIT CAPS Take 2 capsules by mouth daily.   . Cetirizine HCl 10 MG CAPS Take 1 capsule by mouth daily.   . Cholecalciferol 1000 UNITS capsule Take 1,000 Units by mouth daily.   . clonazePAM (KLONOPIN) 0.5 MG tablet Take 0.5-1 tablets (0.25-0.5 mg total) by mouth 2 (two) times daily as needed for anxiety.  . fluticasone (FLONASE) 50 MCG/ACT nasal spray Place 1 spray into the nose daily.  . montelukast (SINGULAIR) 10 MG tablet Take 10 mg by mouth at bedtime.   . MULTIPLE VITAMIN PO Take 1 tablet by mouth daily.   . Omega-3 Fatty Acids (FISH OIL) 1200 MG CAPS Take 1 capsule by mouth daily.   . ondansetron (ZOFRAN) 4 MG tablet Take 1 tablet (4 mg total) by mouth every 8 (eight) hours as needed for nausea or vomiting.  Marland Kitchen oxymetazoline (MUCINEX NASAL SPRAY FULL FORCE) 0.05 % nasal spray Place 1 spray into both nostrils 2 (two) times daily.  . psyllium (REGULOID) 0.52 G capsule Take 1 capsule by mouth 3 (three) times daily.   . sertraline (ZOLOFT) 50 MG tablet Take 1 tablet (50 mg total) by mouth daily.  . simvastatin (ZOCOR) 10 MG tablet TAKE ONE TABLET BY MOUTH AT BEDTIME  . oxyCODONE (OXY IR/ROXICODONE) 5 MG immediate release tablet Take 1 tablet (5  mg total) by mouth every 6 (six) hours as needed for moderate pain. (Patient not taking: Reported on 10/18/2019)  . UNABLE TO FIND Med Name: Allergy Shots once a week  . [DISCONTINUED] sulfamethoxazole-trimethoprim (BACTRIM) 400-80 MG tablet Take 2 tablets by mouth 2 (two) times daily for 5 days. (Patient not taking: Reported on 10/20/2019)   No facility-administered medications prior to visit.    Review of Systems  Constitutional: Positive for fatigue. Negative for activity change, appetite change, chills, diaphoresis, fever and unexpected weight change.  Cardiovascular:  Positive for leg swelling (Left thigh).  Gastrointestinal: Negative.   Musculoskeletal: Positive for myalgias. Negative for arthralgias, back pain, gait problem, joint swelling, neck pain and neck stiffness.  Hematological: Does not bruise/bleed easily.    Last CBC Lab Results  Component Value Date   WBC 12.4 (H) 10/15/2019   HGB 8.1 (L) 10/15/2019   HCT 25.0 (L) 10/15/2019   MCV 93.3 10/15/2019   MCH 30.2 10/15/2019   RDW 13.4 10/15/2019   PLT 343 10/15/2019        Objective:    BP 117/64 (BP Location: Right Arm, Patient Position: Sitting, Cuff Size: Normal)   Pulse 80   Temp (!) 95.5 F (35.3 C) (Temporal)   Wt 117 lb (53.1 kg)   BMI 19.47 kg/m     Physical Exam Vitals and nursing note reviewed.  Constitutional:      Appearance: Normal appearance.  Eyes:     Conjunctiva/sclera: Conjunctivae normal.  Cardiovascular:     Rate and Rhythm: Normal rate and regular rhythm.     Pulses: Normal pulses.     Heart sounds: Normal heart sounds. No murmur.  Pulmonary:     Effort: Pulmonary effort is normal.     Breath sounds: Normal breath sounds. No wheezing.  Musculoskeletal:        General: Swelling (Left upper leg) present.  Skin:    General: Skin is warm and dry.     Comments: Incision c/d/i - dressing removed and replaced  Neurological:     Mental Status: She is alert and oriented to person, place, and time. Mental status is at baseline.  Psychiatric:        Mood and Affect: Mood normal.        Behavior: Behavior normal.        Thought Content: Thought content normal.        Judgment: Judgment normal.      No results found for any visits on 10/20/19.    Assessment & Plan:     Problem List Items Addressed This Visit      Cardiovascular and Mediastinum   DVT (deep venous thrombosis) (HCC) - Primary    Left Lower Extremity. Thrombectomy and stent placed in the hospital. IVC filter placed Continue Eliquis 10mg  BID for 7 days then decrease to 5mg  BID Follow  up with vascular surg as scheduled Attempt easy walking exercise Will discuss starting compression stockings with vascular surgery Advised on elevation Advised on return precautions/emergent precautions, especially as related to PE      Relevant Orders   CBC with Differential/Platelet   Basic Metabolic Panel (BMET)     Other   Renal mass, right    Followed by [facility].   Surgery has been postponed related to acute DVT We will continue to monitor Pain is well controlled No evidence of UTI on recent UA/urine culture      Relevant Orders   CBC with Differential/Platelet   Basic Metabolic  Panel (BMET)   Adjustment disorder with anxiety    Stable on current dose of sertraline  Only uses clonazepam as needed.       Physical deconditioning    RX for a walker  Also consider home physical therapy if not improved.  Attempt adequate nutrition and hydration and easy exercises to maintain muscle mass prior to upcoming surgery          Return in about 1 month (around 11/19/2019) for Recheck anxiety.   I, Lavon Paganini, MD, have reviewed all documentation for this visit. The documentation on 10/20/19 for the exam, diagnosis, procedures, and orders are all accurate and complete.   Lenix Benoist, Dionne Bucy, MD, MPH [facility]

## 2019-10-20 ENCOUNTER — Other Ambulatory Visit: Payer: Self-pay

## 2019-10-20 ENCOUNTER — Ambulatory Visit: Payer: Medicare Other | Admitting: Family Medicine

## 2019-10-20 ENCOUNTER — Encounter: Payer: Self-pay | Admitting: Family Medicine

## 2019-10-20 VITALS — BP 117/64 | HR 80 | Temp 95.5°F | Wt 117.0 lb

## 2019-10-20 DIAGNOSIS — F4322 Adjustment disorder with anxiety: Secondary | ICD-10-CM | POA: Diagnosis not present

## 2019-10-20 DIAGNOSIS — I82422 Acute embolism and thrombosis of left iliac vein: Secondary | ICD-10-CM | POA: Diagnosis not present

## 2019-10-20 DIAGNOSIS — R5381 Other malaise: Secondary | ICD-10-CM

## 2019-10-20 DIAGNOSIS — N2889 Other specified disorders of kidney and ureter: Secondary | ICD-10-CM

## 2019-10-20 NOTE — Assessment & Plan Note (Addendum)
RX for a walker  Also consider home physical therapy if not improved.  Attempt adequate nutrition and hydration and easy exercises to maintain muscle mass prior to upcoming surgery

## 2019-10-20 NOTE — Assessment & Plan Note (Addendum)
Followed by [facility].   Surgery has been postponed related to acute DVT We will continue to monitor Pain is well controlled No evidence of UTI on recent UA/urine culture

## 2019-10-20 NOTE — Assessment & Plan Note (Addendum)
Left Lower Extremity. Thrombectomy and stent placed in the hospital. IVC filter placed Continue Eliquis 10mg  BID for 7 days then decrease to 5mg  BID Follow up with vascular surg as scheduled Attempt easy walking exercise Will discuss starting compression stockings with vascular surgery Advised on elevation Advised on return precautions/emergent precautions, especially as related to PE

## 2019-10-20 NOTE — Assessment & Plan Note (Addendum)
Stable on current dose of sertraline  Only uses clonazepam as needed.

## 2019-10-20 NOTE — Assessment & Plan Note (Signed)
Stable on current dose of sertraline. Only uses clonazepam as needed

## 2019-10-21 ENCOUNTER — Telehealth: Payer: Self-pay

## 2019-10-21 DIAGNOSIS — I82422 Acute embolism and thrombosis of left iliac vein: Secondary | ICD-10-CM

## 2019-10-21 LAB — CBC WITH DIFFERENTIAL/PLATELET
Basophils Absolute: 0 10*3/uL (ref 0.0–0.2)
Basos: 0 %
EOS (ABSOLUTE): 0.2 10*3/uL (ref 0.0–0.4)
Eos: 2 %
Hematocrit: 27.6 % — ABNORMAL LOW (ref 34.0–46.6)
Hemoglobin: 9.1 g/dL — ABNORMAL LOW (ref 11.1–15.9)
Immature Grans (Abs): 0.1 10*3/uL (ref 0.0–0.1)
Immature Granulocytes: 1 %
Lymphocytes Absolute: 2.1 10*3/uL (ref 0.7–3.1)
Lymphs: 21 %
MCH: 30.6 pg (ref 26.6–33.0)
MCHC: 33 g/dL (ref 31.5–35.7)
MCV: 93 fL (ref 79–97)
Monocytes Absolute: 0.9 10*3/uL (ref 0.1–0.9)
Monocytes: 9 %
Neutrophils Absolute: 7 10*3/uL (ref 1.4–7.0)
Neutrophils: 67 %
Platelets: 714 10*3/uL — ABNORMAL HIGH (ref 150–450)
RBC: 2.97 x10E6/uL — ABNORMAL LOW (ref 3.77–5.28)
RDW: 12.8 % (ref 11.7–15.4)
WBC: 10.3 10*3/uL (ref 3.4–10.8)

## 2019-10-21 LAB — BASIC METABOLIC PANEL
BUN/Creatinine Ratio: 19 (ref 12–28)
BUN: 13 mg/dL (ref 8–27)
CO2: 23 mmol/L (ref 20–29)
Calcium: 8.8 mg/dL (ref 8.7–10.3)
Chloride: 100 mmol/L (ref 96–106)
Creatinine, Ser: 0.68 mg/dL (ref 0.57–1.00)
GFR calc Af Amer: 101 mL/min/{1.73_m2} (ref >59)
GFR calc non Af Amer: 88 mL/min/{1.73_m2} (ref >59)
Glucose: 91 mg/dL (ref 65–99)
Potassium: 5.4 mmol/L — ABNORMAL HIGH (ref 3.5–5.2)
Sodium: 136 mmol/L (ref 134–144)

## 2019-10-21 NOTE — Telephone Encounter (Signed)
-----   Message from Virginia Crews, MD sent at 10/21/2019  8:34 AM EDT ----- Normal/stable labs, except for large increase in platelets and high potassium.  Wonder about accuracy of platelets on lab test.  Would like to repeat labs (BMP and CBC with Diff), as well as a peripheral blood smear early next week.

## 2019-10-21 NOTE — Telephone Encounter (Signed)
Patient advised as below. Patient verbalizes understanding and is in agreement with treatment plan.  

## 2019-10-21 NOTE — Telephone Encounter (Signed)
Reviewed labs and they are correct

## 2019-10-25 DIAGNOSIS — I82422 Acute embolism and thrombosis of left iliac vein: Secondary | ICD-10-CM | POA: Diagnosis not present

## 2019-10-26 ENCOUNTER — Telehealth: Payer: Self-pay

## 2019-10-26 ENCOUNTER — Other Ambulatory Visit (INDEPENDENT_AMBULATORY_CARE_PROVIDER_SITE_OTHER): Payer: Self-pay | Admitting: Vascular Surgery

## 2019-10-26 DIAGNOSIS — Z95820 Peripheral vascular angioplasty status with implants and grafts: Secondary | ICD-10-CM

## 2019-10-26 DIAGNOSIS — I82422 Acute embolism and thrombosis of left iliac vein: Secondary | ICD-10-CM

## 2019-10-26 DIAGNOSIS — I82412 Acute embolism and thrombosis of left femoral vein: Secondary | ICD-10-CM

## 2019-10-26 LAB — CBC WITH DIFFERENTIAL/PLATELET
Basophils Absolute: 0 10*3/uL (ref 0.0–0.2)
Basos: 0 %
EOS (ABSOLUTE): 0.1 10*3/uL (ref 0.0–0.4)
Eos: 1 %
Hematocrit: 28.4 % — ABNORMAL LOW (ref 34.0–46.6)
Hemoglobin: 9.1 g/dL — ABNORMAL LOW (ref 11.1–15.9)
Immature Grans (Abs): 0.1 10*3/uL (ref 0.0–0.1)
Immature Granulocytes: 1 %
Lymphocytes Absolute: 2.6 10*3/uL (ref 0.7–3.1)
Lymphs: 32 %
MCH: 29.8 pg (ref 26.6–33.0)
MCHC: 32 g/dL (ref 31.5–35.7)
MCV: 93 fL (ref 79–97)
Monocytes Absolute: 0.7 10*3/uL (ref 0.1–0.9)
Monocytes: 8 %
Neutrophils Absolute: 4.9 10*3/uL (ref 1.4–7.0)
Neutrophils: 58 %
Platelets: 700 10*3/uL — ABNORMAL HIGH (ref 150–450)
RBC: 3.05 x10E6/uL — ABNORMAL LOW (ref 3.77–5.28)
RDW: 13 % (ref 11.7–15.4)
WBC: 8.3 10*3/uL (ref 3.4–10.8)

## 2019-10-26 LAB — BASIC METABOLIC PANEL
BUN/Creatinine Ratio: 21 (ref 12–28)
BUN: 14 mg/dL (ref 8–27)
CO2: 24 mmol/L (ref 20–29)
Calcium: 9 mg/dL (ref 8.7–10.3)
Chloride: 100 mmol/L (ref 96–106)
Creatinine, Ser: 0.66 mg/dL (ref 0.57–1.00)
GFR calc Af Amer: 102 mL/min/{1.73_m2} (ref >59)
GFR calc non Af Amer: 89 mL/min/{1.73_m2} (ref >59)
Glucose: 82 mg/dL (ref 65–99)
Potassium: 4.8 mmol/L (ref 3.5–5.2)
Sodium: 141 mmol/L (ref 134–144)

## 2019-10-26 NOTE — Telephone Encounter (Signed)
-----   Message from Virginia Crews, MD sent at 10/26/2019 11:52 AM EDT ----- Labs are stable and potassium is back to normal.  Platelets remain elevated.  Pathologist smear review is pending.  Recommend that she follow-up with her oncologist/hematologist about the elevated platelets, however.

## 2019-10-26 NOTE — Telephone Encounter (Signed)
Pt's husband Arushi Partridge advised.  (On DPR)   Thanks,   -Mickel Baas

## 2019-10-27 ENCOUNTER — Telehealth (INDEPENDENT_AMBULATORY_CARE_PROVIDER_SITE_OTHER): Payer: Self-pay

## 2019-10-27 LAB — PATHOLOGIST SMEAR REVIEW
Basophils Absolute: 0 10*3/uL (ref 0.0–0.2)
Basos: 1 %
EOS (ABSOLUTE): 0.1 10*3/uL (ref 0.0–0.4)
Eos: 1 %
Hematocrit: 29 % — ABNORMAL LOW (ref 34.0–46.6)
Hemoglobin: 9.2 g/dL — ABNORMAL LOW (ref 11.1–15.9)
Immature Grans (Abs): 0 10*3/uL (ref 0.0–0.1)
Immature Granulocytes: 0 %
Lymphocytes Absolute: 2.7 10*3/uL (ref 0.7–3.1)
Lymphs: 32 %
MCH: 29.4 pg (ref 26.6–33.0)
MCHC: 31.7 g/dL (ref 31.5–35.7)
MCV: 93 fL (ref 79–97)
Monocytes Absolute: 0.7 10*3/uL (ref 0.1–0.9)
Monocytes: 8 %
Neutrophils Absolute: 4.9 10*3/uL (ref 1.4–7.0)
Neutrophils: 58 %
Platelets: 729 10*3/uL — ABNORMAL HIGH (ref 150–450)
RBC: 3.13 x10E6/uL — ABNORMAL LOW (ref 3.77–5.28)
RDW: 13.2 % (ref 11.7–15.4)
WBC: 8.4 10*3/uL (ref 3.4–10.8)

## 2019-10-27 NOTE — Telephone Encounter (Signed)
Patient called wanting the surgical op note from her surgery with Dr. Lucky Cowboy on 10/14/19 to be sent to her doctor at [facility]. I advised that we may require a signature to have that faxed to her provider at [facility]. I called the patient back to explain that because we are all on Epic, it may be that her provider could go into care everywhere and see her procedure information. Patient stated she was going come and sign the paperwork anyway.

## 2019-10-29 ENCOUNTER — Ambulatory Visit (INDEPENDENT_AMBULATORY_CARE_PROVIDER_SITE_OTHER): Payer: Medicare Other | Admitting: Nurse Practitioner

## 2019-10-29 ENCOUNTER — Encounter (INDEPENDENT_AMBULATORY_CARE_PROVIDER_SITE_OTHER): Payer: Medicare Other

## 2019-10-29 DIAGNOSIS — Z95828 Presence of other vascular implants and grafts: Secondary | ICD-10-CM | POA: Diagnosis not present

## 2019-10-29 DIAGNOSIS — I82402 Acute embolism and thrombosis of unspecified deep veins of left lower extremity: Secondary | ICD-10-CM | POA: Diagnosis not present

## 2019-10-29 DIAGNOSIS — N2889 Other specified disorders of kidney and ureter: Secondary | ICD-10-CM | POA: Diagnosis not present

## 2019-10-29 DIAGNOSIS — Z7901 Long term (current) use of anticoagulants: Secondary | ICD-10-CM | POA: Diagnosis not present

## 2019-10-31 DIAGNOSIS — Z95828 Presence of other vascular implants and grafts: Secondary | ICD-10-CM | POA: Insufficient documentation

## 2019-11-01 ENCOUNTER — Telehealth: Payer: Self-pay | Admitting: Family Medicine

## 2019-11-03 ENCOUNTER — Other Ambulatory Visit: Payer: Self-pay | Admitting: Family Medicine

## 2019-11-03 ENCOUNTER — Inpatient Hospital Stay: Payer: Medicare Other | Admitting: Family Medicine

## 2019-11-03 NOTE — Telephone Encounter (Signed)
LOV  10/20/19  LRF  10/08/19  # [phone]

## 2019-11-05 ENCOUNTER — Ambulatory Visit (INDEPENDENT_AMBULATORY_CARE_PROVIDER_SITE_OTHER): Payer: Medicare Other

## 2019-11-05 ENCOUNTER — Other Ambulatory Visit: Payer: Self-pay

## 2019-11-05 ENCOUNTER — Ambulatory Visit (INDEPENDENT_AMBULATORY_CARE_PROVIDER_SITE_OTHER): Payer: Medicare Other | Admitting: Nurse Practitioner

## 2019-11-05 ENCOUNTER — Encounter (INDEPENDENT_AMBULATORY_CARE_PROVIDER_SITE_OTHER): Payer: Self-pay | Admitting: Nurse Practitioner

## 2019-11-05 VITALS — BP 128/62 | HR 70 | Ht 65.0 in | Wt 114.0 lb

## 2019-11-05 DIAGNOSIS — E7800 Pure hypercholesterolemia, unspecified: Secondary | ICD-10-CM

## 2019-11-05 DIAGNOSIS — Z95820 Peripheral vascular angioplasty status with implants and grafts: Secondary | ICD-10-CM

## 2019-11-05 DIAGNOSIS — I82412 Acute embolism and thrombosis of left femoral vein: Secondary | ICD-10-CM | POA: Diagnosis not present

## 2019-11-05 DIAGNOSIS — I82422 Acute embolism and thrombosis of left iliac vein: Secondary | ICD-10-CM | POA: Diagnosis not present

## 2019-11-07 NOTE — Progress Notes (Signed)
Subjective:    Patient ID: Rita Webb, female    DOB: Oct 13, 1945, 74 y.o.   MRN: [id number] Chief Complaint  Patient presents with  . Follow-up    U/S follow up    The patient presents today after left lower extremity thrombectomy on 10/14/2019.  The patient was found to have an extensive thrombus in her left lower extremity with subsequent discovery of a renal cell carcinoma.  The patient underwent extensive intervention including:  1. US guidance for vascular access to right femoral vein and left popliteal vein 2. Catheter placement into IVC from right femoral vein approach and into the left common iliac vein from the left popliteal approach 3. IVC gram and left lower extremity venogram 4. IVC filter placement 5.   Catheter directed thrombolysis with 6 mg of TPA to the left iliac veins, common femoral vein, and superficial femoral vein 6. Mechanical thrombectomy to the left common and external iliac veins, common femoral vein, superficial femoral vein, and popliteal vein with the penumbra CAT 12 device 7. PTA of left superficial femoral, common femoral, external iliac, and common iliac veins with 10 mm balloon 8. Stent placement of the left common and external iliac veins with 16 mm diameter by 10 cm length VeNovo stent  Patient has an upcoming intervention planned for renal cell carcinoma.  The patient does also report that her lower extremity edema has been much better since her thrombectomy.  Today there is only slight swelling in the left lower extremity.  Overall the patient is doing well.  She denies any issues with her Eliquis.  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  However the ultrasound  results today are improved.  From the previous studies.   Review of Systems  Respiratory: Negative for shortness of breath.   Cardiovascular: Positive for leg swelling.  All other systems reviewed and are negative.      Objective:   Physical Exam Vitals reviewed.  Constitutional:      Appearance: Normal appearance.  Cardiovascular:     Rate and Rhythm: Normal rate and regular rhythm.     Pulses: Normal pulses.  Pulmonary:     Effort: Pulmonary effort is normal.     Breath sounds: Normal breath sounds.  Musculoskeletal:     Left lower leg: Edema present.  Neurological:     Mental Status: She is alert and oriented to person, place, and time.  Psychiatric:        Mood and Affect: Mood normal.        Behavior: Behavior normal.        Thought Content: Thought content normal.        Judgment: Judgment normal.     BP 128/62   Pulse 70   Ht 5\' 5"  (1.651 m)   Wt 114 lb (51.7 kg)   BMI 18.97 kg/m   Past Medical History:  Diagnosis Date  . Allergy   . Hyperlipidemia     Social History   Socioeconomic History  . Marital status: Married    Spouse name: Not on file  . Number of children: 2  . Years of education: Not on file  . Highest education level: Not on file  Occupational History  . Not on file  Tobacco  Use  . Smoking status: Former Smoker    Quit date: 11/10/1968    Years since quitting: 51.0  . Smokeless tobacco: Never Used  Substance and Sexual Activity  . Alcohol use: No  . Drug use: No  . Sexual activity: Not on file  Other Topics Concern  . Not on file  Social History Narrative  . Not on file   Social Determinants of Health   Financial Resource Strain:   . Difficulty of Paying Living Expenses:   Food Insecurity:   . Worried About Charity fundraiser in the Last Year:   . Arboriculturist in the Last Year:   Transportation Needs:   . Film/video editor (Medical):   Marland Kitchen Lack of Transportation (Non-Medical):   Physical Activity:   . Days of  Exercise per Week:   . Minutes of Exercise per Session:   Stress:   . Feeling of Stress :   Social Connections:   . Frequency of Communication with Friends and Family:   . Frequency of Social Gatherings with Friends and Family:   . Attends Religious Services:   . Active Member of Clubs or Organizations:   . Attends Archivist Meetings:   Marland Kitchen Marital Status:   Intimate Partner Violence:   . Fear of Current or Ex-Partner:   . Emotionally Abused:   Marland Kitchen Physically Abused:   . Sexually Abused:     Past Surgical History:  Procedure Laterality Date  . GANGLION CYST EXCISION Right 1996   wrist  . MELANOMA EXCISION  08/2011  . PERIPHERAL VASCULAR THROMBECTOMY Left 10/14/2019   Procedure: LEFT LOWER EXTREMITY VENOUS LYSIS / THROMBECTOMY WITH IVF FILTER;  Surgeon: Algernon Huxley, MD;  Location: [facility];  Service: Cardiovascular;  Laterality: Left;  . SPINE SURGERY      Family History  Problem Relation Age of Onset  . Breast cancer Mother 63  . Prostate cancer Father   . Diabetes Maternal Aunt   . Diabetes Maternal Uncle     Allergies  Allergen Reactions  . Cefuroxime Diarrhea       Assessment & Plan:   1. Acute deep vein thrombosis (DVT) of femoral vein of left lower extremity (HCC) We agree with the current hematology assessment that the patient should keep her IVC filter in place at this time.  The patient has upcoming surgery that will necessitate her positive anticoagulation.  Also the patient will be going through treatment for her malignancy which may also call for additional surgeries.  We will have the patient follow-up in 6 months prior to her stopping anticoagulation to discuss IVC filter removal.  We will plan on removing IVC filter prior to stopping her anticoagulation.  This will also depend on how her treatment is progressing as well.  Patient is also advised to continue to utilize medical grade 1 compression stockings to help with possible edema in  addition to postphlebitic symptoms.  Patient will exercise as much as possible in addition to elevation of her lower extremities.  2. Hypercholesteremia Continue statin as ordered and reviewed, no changes at this time    Current Outpatient Medications on File Prior to Visit  Medication Sig Dispense Refill  . apixaban (ELIQUIS) 5 MG TABS tablet Take apixaban/Eliquis 10 mg (2 tabs) twice daily for 7 days, then take 5 mg (1 tab) twice daily 72 tablet 0  . apixaban (ELIQUIS) 5 MG TABS tablet Take by mouth.    . B Complex  Vitamins (B COMPLEX 1 PO) Take 1 tablet by mouth daily.    . B COMPLEX-C PO Take 1 tablet by mouth daily.     . Calcium Carbonate-Vitamin D 600-200 MG-UNIT CAPS Take 2 capsules by mouth daily.     . Cetirizine HCl 10 MG CAPS Take 1 capsule by mouth daily.     . Cholecalciferol 1000 UNITS capsule Take 1,000 Units by mouth daily.     . clonazePAM (KLONOPIN) 0.5 MG tablet TAKE 1/2 TO 1 TABLET BY MOUTH 2 TIMES DAILY AS NEEDED FOR ANXIETY. 30 tablet 1  . fluticasone (FLONASE) 50 MCG/ACT nasal spray Place 1 spray into the nose daily.    . montelukast (SINGULAIR) 10 MG tablet Take 10 mg by mouth at bedtime.     . MULTIPLE VITAMIN PO Take 1 tablet by mouth daily.     . Omega-3 Fatty Acids (FISH OIL) 1200 MG CAPS Take 1 capsule by mouth daily.     . ondansetron (ZOFRAN) 4 MG tablet Take 1 tablet (4 mg total) by mouth every 8 (eight) hours as needed for nausea or vomiting. 20 tablet 0  . oxymetazoline (MUCINEX NASAL SPRAY FULL FORCE) 0.05 % nasal spray Place 1 spray into both nostrils 2 (two) times daily.    . psyllium (REGULOID) 0.52 G capsule Take 1 capsule by mouth 3 (three) times daily.     . sertraline (ZOLOFT) 50 MG tablet Take 1 tablet (50 mg total) by mouth daily. 30 tablet 3  . simvastatin (ZOCOR) 10 MG tablet TAKE ONE TABLET BY MOUTH AT BEDTIME 90 tablet 1  . UNABLE TO FIND Med Name: Allergy Shots once a week    . acetaminophen (TYLENOL) 500 MG tablet Take 500 mg by mouth  every 6 (six) hours as needed.    Marland Kitchen oxyCODONE (OXY IR/ROXICODONE) 5 MG immediate release tablet Take 1 tablet (5 mg total) by mouth every 6 (six) hours as needed for moderate pain. (Patient not taking: Reported on 10/18/2019) 20 tablet 0   No current facility-administered medications on file prior to visit.    There are no Patient Instructions on file for this visit. No follow-ups on file.   Kris Hartmann, NP

## 2019-11-08 ENCOUNTER — Encounter (INDEPENDENT_AMBULATORY_CARE_PROVIDER_SITE_OTHER): Payer: Self-pay | Admitting: Nurse Practitioner

## 2019-11-08 DIAGNOSIS — F419 Anxiety disorder, unspecified: Secondary | ICD-10-CM | POA: Diagnosis not present

## 2019-11-08 DIAGNOSIS — K660 Peritoneal adhesions (postprocedural) (postinfection): Secondary | ICD-10-CM | POA: Diagnosis not present

## 2019-11-08 DIAGNOSIS — D649 Anemia, unspecified: Secondary | ICD-10-CM | POA: Diagnosis not present

## 2019-11-08 DIAGNOSIS — Z85828 Personal history of other malignant neoplasm of skin: Secondary | ICD-10-CM | POA: Diagnosis not present

## 2019-11-08 DIAGNOSIS — E7800 Pure hypercholesterolemia, unspecified: Secondary | ICD-10-CM | POA: Diagnosis not present

## 2019-11-08 DIAGNOSIS — I82512 Chronic embolism and thrombosis of left femoral vein: Secondary | ICD-10-CM | POA: Diagnosis not present

## 2019-11-08 DIAGNOSIS — Z681 Body mass index (BMI) 19 or less, adult: Secondary | ICD-10-CM | POA: Diagnosis not present

## 2019-11-08 DIAGNOSIS — D3501 Benign neoplasm of right adrenal gland: Secondary | ICD-10-CM | POA: Diagnosis not present

## 2019-11-08 DIAGNOSIS — E279 Disorder of adrenal gland, unspecified: Secondary | ICD-10-CM | POA: Diagnosis not present

## 2019-11-08 DIAGNOSIS — I82522 Chronic embolism and thrombosis of left iliac vein: Secondary | ICD-10-CM | POA: Diagnosis not present

## 2019-11-08 DIAGNOSIS — R918 Other nonspecific abnormal finding of lung field: Secondary | ICD-10-CM | POA: Diagnosis not present

## 2019-11-08 DIAGNOSIS — Z87891 Personal history of nicotine dependence: Secondary | ICD-10-CM | POA: Diagnosis not present

## 2019-11-08 DIAGNOSIS — Z881 Allergy status to other antibiotic agents status: Secondary | ICD-10-CM | POA: Diagnosis not present

## 2019-11-08 DIAGNOSIS — K8010 Calculus of gallbladder with chronic cholecystitis without obstruction: Secondary | ICD-10-CM | POA: Diagnosis not present

## 2019-11-08 DIAGNOSIS — E46 Unspecified protein-calorie malnutrition: Secondary | ICD-10-CM | POA: Diagnosis not present

## 2019-11-08 DIAGNOSIS — Z20822 Contact with and (suspected) exposure to covid-19: Secondary | ICD-10-CM | POA: Diagnosis not present

## 2019-11-08 DIAGNOSIS — K8020 Calculus of gallbladder without cholecystitis without obstruction: Secondary | ICD-10-CM | POA: Diagnosis not present

## 2019-11-08 DIAGNOSIS — C641 Malignant neoplasm of right kidney, except renal pelvis: Secondary | ICD-10-CM | POA: Diagnosis not present

## 2019-11-08 DIAGNOSIS — Z7901 Long term (current) use of anticoagulants: Secondary | ICD-10-CM | POA: Diagnosis not present

## 2019-11-10 DIAGNOSIS — C641 Malignant neoplasm of right kidney, except renal pelvis: Secondary | ICD-10-CM | POA: Diagnosis not present

## 2019-11-10 DIAGNOSIS — K8010 Calculus of gallbladder with chronic cholecystitis without obstruction: Secondary | ICD-10-CM | POA: Diagnosis not present

## 2019-11-10 DIAGNOSIS — K660 Peritoneal adhesions (postprocedural) (postinfection): Secondary | ICD-10-CM | POA: Diagnosis not present

## 2019-11-10 HISTORY — PX: LAPAROSCOPIC NEPHRECTOMY: SUR781

## 2019-11-18 ENCOUNTER — Ambulatory Visit: Payer: Self-pay | Admitting: Family Medicine

## 2019-11-18 NOTE — Telephone Encounter (Signed)
Pt's daughter calling initially.  States pt's incision has started to drain .Pt not present. States pt is "A bundle of nerves over this."   Called pt directly for triage.  Pt had 'ROBOTIC ASSISTED LAPAROSCOPIC NEPHRECTOMY radical nephrectomy' 11/10/2019. Pt states GB also removed. Reports noted small amount of "Light bloody drainage with clear drainage" this afternoon, prior to call, at small laparoscopic site on abdomen.No sutures or staples "Glue." States incision is open "About 1/8 in." Denies pain, no fever.  Questioned if pt has alerted surgeon, states she has not "I was hoping Dr. Brita Romp could look at it, we are 15 minutes away."   Pt very anxious. Advised with time frame unlikely she could be seen at this time but would call practice. Spoke with [place]. Pt made aware not able to be seen at this time at practice. Advised to alert surgeon. Attempted to schedule appt tomorrow with Dr. Brita Romp, no availability, pt declined another provider. States she will alert surgeon and will call back for appt at practice if needed. Advised UC if worsens, pain or fever occurs. Advised to cleanse incision with sterile saline swab and cover with DSD. Pt verbalizes understanding.  Reason for Disposition . [1] Clear or blood-tinged fluid draining from wound AND [2] no fever    OPen 1/8 in. Marland KitchenMarland KitchenMarland KitchenGlue  Answer Assessment - Initial Assessment Questions 1. SYMPTOM: "What's the main symptom you're concerned about?" (e.g., redness, pain, drainage)    Drainage 2. ONSET: "When did *No Answer*  start?"     "Just now" 3. SURGERY: "What surgery was performed?"     ROBOTIC ASSISTED LAPAROSCOPIC NEPHRECTOMY,  radical nephrectomy. Pt states GB removed also 4. DATE of SURGERY: "When was surgery performed?"      11/10/2019 5. INCISION SITE: "Where is the incision located?"      Abdomen 6. REDNESS: "Is there any redness at the incision site?" If yes, ask: "How wide across is the redness?" (Inches, centimeters)       no 7. PAIN: "Is there any pain?" If so, ask: "How bad is it?"  (Scale 1-10; or mild, moderate, severe)     no 8. BLEEDING: "Is there any bleeding?" If so, ask: "How much?" and "Where?"     Small, "Not much" 9. DRAINAGE: "Is there any drainage from the incision site?" If yes, ask: "What color and how much?" (e.g., red, cloudy, pus; drops, teaspoon)     Blood tinged and serous described. 10. FEVER: "Do you have a fever?" If so, ask: "What is your temperature, how was it measured, and when did it start?"       no 11. OTHER SYMPTOMS: "Do you have any other symptoms?" (e.g., shaking chills, weakness, rash elsewhere on body)       no  Protocols used: POST-OP INCISION [facility]

## 2019-11-19 NOTE — Telephone Encounter (Signed)
Can we check in and make sure tht they spoke to the surgeon or had this looked at?

## 2019-11-19 NOTE — Telephone Encounter (Signed)
Pt was able to get get in contact with the surgeon.  The surgeon advised her the drainage was normal.   Thanks,   -Mickel Baas

## 2019-11-24 ENCOUNTER — Telehealth (INDEPENDENT_AMBULATORY_CARE_PROVIDER_SITE_OTHER): Payer: Medicare Other | Admitting: Family Medicine

## 2019-11-24 DIAGNOSIS — F4322 Adjustment disorder with anxiety: Secondary | ICD-10-CM

## 2019-11-24 NOTE — Progress Notes (Signed)
Virtual telephone visit    Virtual Visit via Telephone Note   This visit type was conducted due to national recommendations for restrictions regarding the COVID-19 Pandemic (e.g. social distancing) in an effort to limit this patient's exposure and mitigate transmission in our community. Due to her co-morbid illnesses, this patient is at least at moderate risk for complications without adequate follow up. This format is felt to be most appropriate for this patient at this time. The patient did not have access to video technology or had technical difficulties with video requiring transitioning to audio format only (telephone). Physical exam was limited to content and character of the telephone converstion.     Patient location: home Provider location: [place] involved in the visit: patient, provider    Visit Date: 11/24/2019  Today's healthcare provider: Lavon Paganini, MD   Chief Complaint  Patient presents with  . Anxiety   Subjective    Anxiety Presents for follow-up visit. Symptoms include excessive worry and nervous/anxious behavior. Patient reports no decreased concentration, depressed mood, insomnia, malaise, nausea, panic, restlessness or suicidal ideas. Primary symptoms comment: Pt states she overall feels a lot better. . The quality of sleep is good. Nighttime awakenings: none.    Taking Zoloft 50 mg daily  Clonazepam 0.25mg  BID prn - tried to decrease, but anxiety was worse  Anxiety is improving since having surgery for renal mass.  Appetite is improving.   Patient Active Problem List   Diagnosis Date Noted  . 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 Adjustment disorder with anxiety 10/20/2019  . Physical deconditioning 10/20/2019  . DVT (deep venous thrombosis) ([place]) 10/13/2019  . History of 2019 novel coronavirus disease (COVID-19) 10/11/2019  . Renal mass, right 09/13/2019  . Calculus of gallbladder without cholecystitis without  obstruction 09/13/2019  . Gallstones, common bile duct 09/10/2019  . Family history of breast cancer in mother 02/13/2017  . Osteoporosis 02/27/2015  . Allergic rhinitis 11/11/2014  . Hypercholesteremia 11/11/2014  . History of malignant melanoma of skin 11/11/2014  . Avitaminosis D 11/11/2014  . 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 Allergy   . Hyperlipidemia    Social History   Tobacco Use  . Smoking status: Former Smoker    Quit date: 11/10/1968    Years since quitting: 51.0  . Smokeless tobacco: Never Used  Substance Use Topics  . Alcohol use: No  . Drug use: No   Allergies  Allergen Reactions  . Cefuroxime Diarrhea    Medications: Outpatient Medications Prior to Visit  Medication Sig  . acetaminophen (TYLENOL) 500 MG tablet Take 500 mg by mouth every 6 (six) hours as needed.  Marland Kitchen apixaban (ELIQUIS) 5 MG TABS tablet Take apixaban/Eliquis 10 mg (2 tabs) twice daily for 7 days, then take 5 mg (1 tab) twice daily  . B COMPLEX-C PO Take 1 tablet by mouth daily.   . Calcium Carbonate-Vitamin D 600-200 MG-UNIT CAPS Take 2 capsules by mouth daily.   . Cetirizine HCl 10 MG CAPS Take 1 capsule by mouth daily.   . Cholecalciferol 1000 UNITS capsule Take 1,000 Units by mouth daily.   . clonazePAM (KLONOPIN) 0.5 MG tablet TAKE 1/2 TO 1 TABLET BY MOUTH 2 TIMES DAILY AS NEEDED FOR ANXIETY.  . fluticasone (FLONASE) 50 MCG/ACT nasal spray Place 1 spray into the nose daily.  . montelukast (SINGULAIR) 10 MG tablet Take 10 mg by  mouth at bedtime.   . MULTIPLE VITAMIN PO Take 1 tablet by mouth daily.   . Omega-3 Fatty Acids (FISH OIL) 1200 MG CAPS Take 1 capsule by mouth daily.   . sertraline (ZOLOFT) 50 MG tablet Take 1 tablet (50 mg total) by mouth daily.  . simvastatin (ZOCOR) 10 MG tablet TAKE ONE TABLET BY MOUTH AT BEDTIME  . [DISCONTINUED] ondansetron (ZOFRAN) 4 MG tablet Take 1 tablet (4 mg total) by mouth  every 8 (eight) hours as needed for nausea or vomiting.  . [DISCONTINUED] oxymetazoline (MUCINEX NASAL SPRAY FULL FORCE) 0.05 % nasal spray Place 1 spray into both nostrils 2 (two) times daily.  . psyllium (REGULOID) 0.52 G capsule Take 1 capsule by mouth 3 (three) times daily.   Marland Kitchen UNABLE TO FIND Med Name: Allergy Shots once a week  . [DISCONTINUED] apixaban (ELIQUIS) 5 MG TABS tablet Take by mouth.  . [DISCONTINUED] B Complex Vitamins (B COMPLEX 1 PO) Take 1 tablet by mouth daily.  . [DISCONTINUED] oxyCODONE (OXY IR/ROXICODONE) 5 MG immediate release tablet Take 1 tablet (5 mg total) by mouth every 6 (six) hours as needed for moderate pain. (Patient not taking: Reported on 10/18/2019)   No facility-administered medications prior to visit.    Review of Systems  Gastrointestinal: Negative for nausea.  Psychiatric/Behavioral: Negative for decreased concentration and suicidal ideas. The patient is nervous/anxious. The patient does not have insomnia.       Objective    There were no vitals taken for this visit.  Speaking in full sentences in NAD   Assessment & Plan     Problem List Items Addressed This Visit      Other   Adjustment disorder with anxiety - Primary    Stable on Sertraline and clonazepam - will continue Plan to taper clonazepam in the future if possible We have discussed risks of benzos, esp in the elderly Good support system Have discussed therapy F/u in 2-3 months Repeat GAD7 and PHQ9 at that time Return precautions discussed          Return in about 3 months (around 02/24/2020) for anxiety f/u.    I discussed the assessment and treatment plan with the patient. The patient was provided an opportunity to ask questions and all were answered. The patient agreed with the plan and demonstrated an understanding of the instructions.   The patient was advised to call back or seek an in-person evaluation if the symptoms worsen or if the condition fails to improve as  anticipated.  I, Lavon Paganini, MD, have reviewed all documentation for this visit. The documentation on 11/24/19 for the exam, diagnosis, procedures, and orders are all accurate and complete.   Marilyn Nihiser, Dionne Bucy, MD, MPH [facility]

## 2019-11-24 NOTE — Assessment & Plan Note (Signed)
Stable on Sertraline and clonazepam - will continue Plan to taper clonazepam in the future if possible We have discussed risks of benzos, esp in the elderly Good support system Have discussed therapy F/u in 2-3 months Repeat GAD7 and PHQ9 at that time Return precautions discussed

## 2019-11-30 DIAGNOSIS — K8020 Calculus of gallbladder without cholecystitis without obstruction: Secondary | ICD-10-CM | POA: Diagnosis not present

## 2019-12-09 DIAGNOSIS — C641 Malignant neoplasm of right kidney, except renal pelvis: Secondary | ICD-10-CM | POA: Diagnosis not present

## 2019-12-09 DIAGNOSIS — R911 Solitary pulmonary nodule: Secondary | ICD-10-CM | POA: Diagnosis not present

## 2019-12-14 ENCOUNTER — Ambulatory Visit: Payer: Medicare Other | Attending: Internal Medicine

## 2019-12-14 DIAGNOSIS — Z23 Encounter for immunization: Secondary | ICD-10-CM

## 2019-12-14 NOTE — Progress Notes (Signed)
   Covid-19 Vaccination Clinic  Name:  Rita Webb    MRN: [id number] DOB: 09/08/1945  12/14/2019  [name] was observed post Covid-19 immunization for 15 minutes without incident. She was provided with Vaccine Information Sheet and instruction to access the V-Safe system.   [name] was instructed to call 911 with any severe reactions post vaccine: Marland Kitchen Difficulty breathing  . Swelling of face and throat  . A fast heartbeat  . A bad rash all over body  . Dizziness and weakness   Immunizations Administered    Name Date Dose VIS Date Route   Pfizer COVID-19 Vaccine 12/14/2019 10:21 AM 0.3 mL 09/01/2018 Intramuscular   Manufacturer: [place]   Lot: [id number]   [place]: [id number]

## 2019-12-23 ENCOUNTER — Telehealth (INDEPENDENT_AMBULATORY_CARE_PROVIDER_SITE_OTHER): Payer: Self-pay

## 2019-12-23 NOTE — Telephone Encounter (Signed)
The  Pt is going to have a teeth cleaning this evening at [place] at the dental office is sending over a clearance to  To if the pt is ok to have the pt get this don, or does she have meds to stop  Due to having a stent and a filter in her leg.

## 2019-12-24 NOTE — Telephone Encounter (Signed)
I faxed over the clearance sent by the office clearing the pt on 12/23/2019.

## 2019-12-30 ENCOUNTER — Encounter: Payer: Self-pay | Admitting: Family Medicine

## 2020-01-06 ENCOUNTER — Other Ambulatory Visit: Payer: Self-pay | Admitting: Adult Health

## 2020-01-06 ENCOUNTER — Ambulatory Visit: Payer: Medicare Other | Attending: Internal Medicine

## 2020-01-06 DIAGNOSIS — Z23 Encounter for immunization: Secondary | ICD-10-CM

## 2020-01-06 DIAGNOSIS — I82412 Acute embolism and thrombosis of left femoral vein: Secondary | ICD-10-CM

## 2020-01-06 NOTE — Telephone Encounter (Signed)
Requested medication (s) are due for refill today: yes  Requested medication (s) are on the active medication list: yes  Last refill:  11/03/2019  Future visit scheduled: yes  Notes to clinic: this medication was refilled by a different provider  Review for refill   Requested Prescriptions  Pending Prescriptions Disp Refills   ELIQUIS 5 MG TABS tablet [Pharmacy Med Name: ELIQUIS 5 MG TAB] 120 tablet     Sig: TAKE 2 TABLETS BY MOUTH TWICE DAILY FOR 7 DAYS, THEN TAKE 1 TABLET BY MOUTH TWICE DAILY THEREAFTER.      Hematology:  Anticoagulants Failed - 01/06/2020  1:12 PM      Failed - HGB in normal range and within 360 days    Hemoglobin  Date Value Ref Range Status  10/25/2019 9.2 (L) 11.1 - 15.9 g/dL Final          Failed - PLT in normal range and within 360 days    Platelets  Date Value Ref Range Status  10/25/2019 729 (H) 150 - 450 x10E3/uL Final          Failed - HCT in normal range and within 360 days    Hematocrit  Date Value Ref Range Status  10/25/2019 29.0 (L) 34.0 - 46.6 % Final          Passed - Cr in normal range and within 360 days    Creatinine, Ser  Date Value Ref Range Status  10/25/2019 0.66 0.57 - 1.00 mg/dL Final          Passed - Valid encounter within last 12 months    Recent Outpatient Visits           1 month ago Adjustment disorder with anxiety   [facility] [place], Dionne Bucy, MD   2 months ago Acute deep vein thrombosis (DVT) of iliac vein of left lower extremity [facility])   [facility], Dionne Bucy, MD   3 months ago Acute deep vein thrombosis (DVT) of iliac vein of left lower extremity [facility])   [facility], Dionne Bucy, MD   3 months ago Cellulitis of left lower extremity   [facility], Dionne Bucy, MD   3 months ago Calculus of gallbladder without cholecystitis without obstruction   [facility], Dionne Bucy, MD       Future  Appointments             In 1 month Bacigalupo, Dionne Bucy, MD [facility], [place]

## 2020-01-06 NOTE — Progress Notes (Signed)
   Covid-19 Vaccination Clinic  Name:  Rita Webb    MRN: [id number] DOB: 08/18/1945  01/06/2020  [name] was observed post Covid-19 immunization for 15 minutes without incident. She was provided with Vaccine Information Sheet and instruction to access the V-Safe system.   [name] was instructed to call 911 with any severe reactions post vaccine: Marland Kitchen Difficulty breathing  . Swelling of face and throat  . A fast heartbeat  . A bad rash all over body  . Dizziness and weakness   Immunizations Administered    Name Date Dose VIS Date Route   Pfizer COVID-19 Vaccine 01/06/2020  9:59 AM 0.3 mL 09/01/2018 Intramuscular   Manufacturer: [place]   Lot: [id number]   [place]: [id number]

## 2020-01-07 NOTE — Telephone Encounter (Signed)
I've sent in a month's supply of Eliquis. It looks like she had a DVT and subsequent IVC filter placed. The last time I see this mentioned was by vascular surgery on 11/05/2019 visit. Note stated they would remove IVC filter prior to stopping blood thinners though I do not see date to remove IVC filter. She should probably get future refills from vascular surgery or oncology as I do not see any official date to stopping these medications in Dr. Sharmaine Base notes.

## 2020-01-13 ENCOUNTER — Other Ambulatory Visit: Payer: Self-pay | Admitting: Family Medicine

## 2020-01-13 DIAGNOSIS — Z1231 Encounter for screening mammogram for malignant neoplasm of breast: Secondary | ICD-10-CM

## 2020-01-29 ENCOUNTER — Other Ambulatory Visit: Payer: Self-pay | Admitting: Family Medicine

## 2020-01-29 NOTE — Telephone Encounter (Signed)
Requested Prescriptions  Pending Prescriptions Disp Refills   sertraline (ZOLOFT) 50 MG tablet [Pharmacy Med Name: SERTRALINE HCL 50 MG TAB] 90 tablet 1    Sig: TAKE 1 TABLET BY MOUTH ONCE A DAY     Psychiatry:  Antidepressants - SSRI Passed - 01/29/2020  9:22 AM      Passed - Valid encounter within last 6 months    Recent Outpatient Visits          2 months ago Adjustment disorder with anxiety   [facility], Dionne Bucy, MD   3 months ago Acute deep vein thrombosis (DVT) of iliac vein of left lower extremity [facility])   [facility], Dionne Bucy, MD   3 months ago Acute deep vein thrombosis (DVT) of iliac vein of left lower extremity [facility])   [facility], Dionne Bucy, MD   4 months ago Cellulitis of left lower extremity   [facility], Dionne Bucy, MD   4 months ago Calculus of gallbladder without cholecystitis without obstruction   [facility], Dionne Bucy, MD      Future Appointments            In 3 weeks Bacigalupo, Dionne Bucy, MD [facility], [facility]

## 2020-02-07 ENCOUNTER — Other Ambulatory Visit: Payer: Self-pay | Admitting: Physician Assistant

## 2020-02-07 DIAGNOSIS — I82412 Acute embolism and thrombosis of left femoral vein: Secondary | ICD-10-CM

## 2020-02-14 DIAGNOSIS — H43812 Vitreous degeneration, left eye: Secondary | ICD-10-CM | POA: Diagnosis not present

## 2020-02-18 ENCOUNTER — Other Ambulatory Visit: Payer: Self-pay

## 2020-02-18 ENCOUNTER — Ambulatory Visit
Admission: RE | Admit: 2020-02-18 | Discharge: 2020-02-18 | Disposition: A | Discharge status: Home / Self Care | Payer: Medicare Other | Source: Ambulatory Visit | Attending: Family Medicine | Admitting: Family Medicine

## 2020-02-18 DIAGNOSIS — Z1231 Encounter for screening mammogram for malignant neoplasm of breast: Secondary | ICD-10-CM | POA: Diagnosis not present

## 2020-02-21 ENCOUNTER — Encounter: Payer: Self-pay | Admitting: Family Medicine

## 2020-02-21 ENCOUNTER — Telehealth: Payer: Self-pay

## 2020-02-21 NOTE — Telephone Encounter (Signed)
Patient advised as below.  

## 2020-02-21 NOTE — Telephone Encounter (Signed)
-----   Message from Virginia Crews, MD sent at 02/21/2020 10:44 AM EDT ----- Normal mammogram. Repeat in 1 yr

## 2020-02-21 NOTE — Progress Notes (Signed)
Subjective:   Rita Webb is a 74 y.o. female who presents for Medicare Annual (Subsequent) preventive examination.  I connected with Ezzie Dural today by telephone and verified that I am speaking with the correct person using two identifiers. Location patient: home Location provider: work Persons participating in the virtual visit: patient, provider.   I discussed the limitations, risks, security and privacy concerns of performing an evaluation and management service by telephone and the availability of in person appointments. I also discussed with the patient that there may be a patient responsible charge related to this service. The patient expressed understanding and verbally consented to this telephonic visit.    Interactive audio and video telecommunications were attempted between this provider and patient, however failed, due to patient having technical difficulties OR patient did not have access to video capability.  We continued and completed visit with audio only.   Review of Systems    N/A  Cardiac Risk Factors include: advanced age (>73men, >67 women);dyslipidemia     Objective:    There were no vitals filed for this visit. There is no height or weight on file to calculate BMI.  Advanced Directives 02/22/2020 10/14/2019 10/13/2019 02/07/2016 03/09/2015 02/02/2015  Does Patient Have a Medical Advance Directive? Yes Yes Yes Yes Yes Yes  Type of Paramedic of [place];Living will Living will Living will [place];Living will Living will;Healthcare Power of Attorney Living will  Does patient want to make changes to medical advance directive? - No - Patient declined - - - No - Patient declined  Copy of [place] in Chart? Yes - validated most recent copy scanned in chart (See row information) - - - - Yes    Current Medications (verified) Outpatient Encounter Medications as of 02/22/2020  Medication Sig  .  acetaminophen (TYLENOL) 325 MG tablet Take 650 mg by mouth every 6 (six) hours as needed.  . B COMPLEX-C PO Take 1 tablet by mouth daily.   . Calcium Carbonate-Vitamin D 600-200 MG-UNIT CAPS Take 2 capsules by mouth daily.   . Cetirizine HCl 10 MG CAPS Take 1 capsule by mouth daily.   . Cholecalciferol 1000 UNITS capsule Take 1,000 Units by mouth daily.   Marland Kitchen ELIQUIS 5 MG TABS tablet TAKE 1 TABLET BY MOUTH TWICE A DAY  . fluticasone (FLONASE) 50 MCG/ACT nasal spray Place 1 spray into the nose daily.  . montelukast (SINGULAIR) 10 MG tablet Take 10 mg by mouth at bedtime.   . MULTIPLE VITAMIN PO Take 1 tablet by mouth daily.   . Omega-3 Fatty Acids (FISH OIL) 1200 MG CAPS Take 1 capsule by mouth daily.   . psyllium (REGULOID) 0.52 G capsule Take 1 capsule by mouth 3 (three) times daily.   . sertraline (ZOLOFT) 50 MG tablet TAKE 1 TABLET BY MOUTH ONCE A DAY  . simvastatin (ZOCOR) 10 MG tablet TAKE ONE TABLET BY MOUTH AT BEDTIME  . acetaminophen (TYLENOL) 500 MG tablet Take 500 mg by mouth every 6 (six) hours as needed. (Patient not taking: Reported on 02/22/2020)  . clonazePAM (KLONOPIN) 0.5 MG tablet TAKE 1/2 TO 1 TABLET BY MOUTH 2 TIMES DAILY AS NEEDED FOR ANXIETY. (Patient not taking: Reported on 02/22/2020)  . UNABLE TO FIND Med Name: Allergy Shots once a week (Patient not taking: Reported on 02/22/2020)   No facility-administered encounter medications on file as of 02/22/2020.    Allergies (verified) Cefuroxime   History: Past Medical History:  Diagnosis Date  .  Allergy   . Hyperlipidemia   . Renal mass    Past Surgical History:  Procedure Laterality Date  . ADRENALECTOMY    . CHOLECYSTECTOMY    . GANGLION CYST EXCISION Right 1996   wrist  . LAPAROSCOPIC NEPHRECTOMY    . MELANOMA EXCISION  08/2011  . PERIPHERAL VASCULAR THROMBECTOMY Left 10/14/2019   Procedure: LEFT LOWER EXTREMITY VENOUS LYSIS / THROMBECTOMY WITH IVF FILTER;  Surgeon: Algernon Huxley, MD;  Location: [facility];  Service: Cardiovascular;  Laterality: Left;  . SPINE SURGERY     Family History  Problem Relation Age of Onset  . Breast cancer Mother 77  . Prostate cancer Father   . Diabetes Maternal Aunt   . Diabetes Maternal Uncle    Social History   Socioeconomic History  . Marital status: Married    Spouse name: Not on file  . Number of children: 2  . Years of education: Not on file  . Highest education level: Some college, no degree  Occupational History  . Occupation: retired  Tobacco Use  . Smoking status: Former Smoker    Quit date: 11/10/1968    Years since quitting: 51.3  . Smokeless tobacco: Never Used  Vaping Use  . Vaping Use: Never used  Substance and Sexual Activity  . Alcohol use: No  . Drug use: No  . Sexual activity: Not on file  Other Topics Concern  . Not on file  Social History Narrative  . Not on file   Social Determinants of Health   Financial Resource Strain: Low Risk   . Difficulty of Paying Living Expenses: Not hard at all  Food Insecurity: No Food Insecurity  . Worried About Charity fundraiser in the Last Year: Never true  . Ran Out of Food in the Last Year: Never true  Transportation Needs: No Transportation Needs  . Lack of Transportation (Medical): No  . Lack of Transportation (Non-Medical): No  Physical Activity: Insufficiently Active  . Days of Exercise per Week: 5 days  . Minutes of Exercise per Session: 10 min  Stress: No Stress Concern Present  . Feeling of Stress : Only a little  Social Connections: Moderately Integrated  . Frequency of Communication with Friends and Family: More than three times a week  . Frequency of Social Gatherings with Friends and Family: More than three times a week  . Attends Religious Services: More than 4 times per year  . Active Member of Clubs or Organizations: No  . Attends Archivist Meetings: Never  . Marital Status: Married    Tobacco Counseling Counseling given: Not  Answered   Clinical Intake:  Pre-visit preparation completed: Yes  Pain : No/denies pain     Nutritional Risks: None Diabetes: No  How often do you need to have someone help you when you read instructions, pamphlets, or other written materials from your doctor or pharmacy?: 1 - Never  Diabetic? No  Interpreter Needed?: No  Information entered by :: [facility], LPN   Activities of Daily Living In your present state of health, do you have any difficulty performing the following activities: 02/22/2020 10/14/2019  Hearing? N N  Vision? Y Y  Comment Due to cataracts on both eyes. Dr Lucita Ferrara to remove the left catarct this fall. cataracts  Difficulty concentrating or making decisions? N N  Walking or climbing stairs? Y N  Comment Due to left ankle. -  Dressing or bathing? N N  Doing errands, shopping? N  N  Preparing Food and eating ? N -  Using the Toilet? N -  In the past six months, have you accidently leaked urine? N -  Do you have problems with loss of bowel control? N -  Managing your Medications? N -  Managing your Finances? N -  Housekeeping or managing your Housekeeping? N -  Some recent data might be hidden    Patient Care Team: Bacigalupo, Dionne Bucy, MD as PCP - General (Family Medicine) Thelma Comp, [place] (Optometry) Vevelyn Royals, MD as Consulting Physician (Ophthalmology) Dionne Milo, Rockney Ghee, MD as Referring Physician (Hematology and Oncology) Timoteo Gaul, MD as Referring Physician (Urology) Lucky Cowboy Erskine Squibb, MD as Referring Physician (Vascular Surgery) Beverly Gust, MD (Otolaryngology) Dasher, Rayvon Char, MD (Dermatology)  Indicate any recent Medical Services you may have received from other than [facility] providers in the past year (date may be approximate).     Assessment:   This is a routine wellness examination for Bambie.  Hearing/Vision screen No exam data present  Dietary issues and exercise activities discussed: Current  Exercise Habits: Home exercise routine, Type of exercise: walking, Time (Minutes): 15, Frequency (Times/Week): 5, Weekly Exercise (Minutes/Week): 75, Intensity: Mild, Exercise limited by: None identified  Goals    . Increase physical activity      Depression Screen PHQ 2/9 Scores 02/22/2020 02/18/2019 02/16/2018 02/13/2017 02/07/2016 02/02/2015  PHQ - 2 Score 0 1 0 0 0 0  PHQ- 9 Score - 1 - 1 - -    Fall Risk Fall Risk  02/22/2020 02/18/2019 02/16/2018 02/13/2017 02/07/2016  Falls in the past year? 0 0 No No No  Number falls in past yr: 0 0 - - -  Injury with Fall? 0 0 - - -    Any stairs in or around the home? Yes  If so, are there any without handrails? No  Home free of loose throw rugs in walkways, pet beds, electrical cords, etc? Yes  Adequate lighting in your home to reduce risk of falls? Yes   ASSISTIVE DEVICES UTILIZED TO PREVENT FALLS:  Life alert? No  Use of a cane, walker or w/c? No  Grab bars in the bathroom? Yes  Shower chair or bench in shower? No  Elevated toilet seat or a handicapped toilet? Yes    Cognitive Function:     6CIT Screen 02/22/2020  What Year? 0 points  What month? 0 points  What time? 0 points  Count back from 20 0 points  Months in reverse 0 points  Repeat phrase 0 points  Total Score 0    Immunizations Immunization History  Administered Date(s) Administered  . Influenza Split 07/21/2012  . Influenza, High Dose Seasonal PF 04/14/2014, 05/11/2015, 05/04/2016, 05/08/2017, 04/25/2018  . Influenza,inj,Quad PF,6+ Mos 04/15/2013  . PFIZER SARS-COV-2 Vaccination 12/14/2019, 01/06/2020  . Pneumococcal Conjugate-13 12/29/2013  . Pneumococcal Polysaccharide-23 12/28/2012  . Tdap 11/27/2011  . Zoster 01/23/2011  . Zoster Recombinat (Shingrix) 03/05/2018, 06/11/2018    TDAP status: Up to date Flu Vaccine status: Due fall 2021 Pneumococcal vaccine status: Up to date Covid-19 vaccine status: Completed vaccines  Qualifies for Shingles Vaccine? Yes    Zostavax completed Yes   Shingrix Completed?: Yes  Screening Tests Health Maintenance  Topic Date Due  . INFLUENZA VACCINE  02/06/2020  . DEXA SCAN  03/16/2021  . TETANUS/TDAP  11/26/2021  . COLONOSCOPY  01/20/2022  . MAMMOGRAM  02/17/2022  . COVID-19 Vaccine  Completed  . Hepatitis C Screening  Completed  . PNA  vac Low Risk Adult  Completed    Health Maintenance  Health Maintenance Due  Topic Date Due  . INFLUENZA VACCINE  02/06/2020    Colorectal cancer screening: Completed 01/21/12. Repeat every 10 years Mammogram status: Completed 02/18/20. Repeat every year Bone Density status: Completed 03/17/19. Results reflect: Bone density results: OSTEOPOROSIS. Repeat every 2 years.  Lung Cancer Screening: (Low Dose CT Chest recommended if Age 31-80 years, 30 pack-year currently smoking OR have quit w/in 15years.) does not qualify.    Additional Screening:  Hepatitis C Screening: Up to date  Vision Screening: Recommended annual ophthalmology exams for early detection of glaucoma and other disorders of the eye. Is the patient up to date with their annual eye exam?  Yes  Who is the provider or what is the name of the office in which the patient attends annual eye exams? Dr Rick Duff If pt is not established with a provider, would they like to be referred to a provider to establish care? No .   Dental Screening: Recommended annual dental exams for proper oral hygiene  Community Resource Referral / Chronic Care Management: CRR required this visit?  No   CCM required this visit?  No      Plan:     I have personally reviewed and noted the following in the patient's chart:   . Medical and social history . Use of alcohol, tobacco or illicit drugs  . Current medications and supplements . Functional ability and status . Nutritional status . Physical activity . Advanced directives . List of other physicians . Hospitalizations, surgeries, and ER visits in previous 12  months . Vitals . Screenings to include cognitive, depression, and falls . Referrals and appointments  In addition, I have reviewed and discussed with patient certain preventive protocols, quality metrics, and best practice recommendations. A written personalized care plan for preventive services as well as general preventive health recommendations were provided to patient.     Namiyah Grantham [address], [place]   [id number]/4650   Nurse Notes: None.

## 2020-02-22 ENCOUNTER — Other Ambulatory Visit: Payer: Self-pay

## 2020-02-22 ENCOUNTER — Ambulatory Visit (INDEPENDENT_AMBULATORY_CARE_PROVIDER_SITE_OTHER): Payer: Medicare Other

## 2020-02-22 DIAGNOSIS — Z0000 Encounter for general adult medical examination without abnormal findings: Secondary | ICD-10-CM

## 2020-02-22 NOTE — Patient Instructions (Signed)
[name] , Thank you for taking time to come for your Medicare Wellness Visit. I appreciate your ongoing commitment to your health goals. Please review the following plan we discussed and let me know if I can assist you in the future.   Screening recommendations/referrals: Colonoscopy: Up to date, due 01/2022 Mammogram: Up to date, due 02/2021 Bone Density: Up to date, due 03/2021 Recommended yearly ophthalmology/optometry visit for glaucoma screening and checkup Recommended yearly dental visit for hygiene and checkup  Vaccinations: Influenza vaccine: Due fall 2021 Pneumococcal vaccine: Completed series Tdap vaccine: Up to date, due 11/2021 Shingles vaccine: Completed series    Advanced directives: Currently on file.  Conditions/risks identified: None- declined diet changes.  Next appointment: 06/23/20 @ 2:00 PM with Dr Rita Webb    Preventive Care 65 Years and Older, Female Preventive care refers to lifestyle choices and visits with your health care provider that can promote health and wellness. What does preventive care include?  A yearly physical exam. This is also called an annual well check.  Dental exams once or twice a year.  Routine eye exams. Ask your health care provider how often you should have your eyes checked.  Personal lifestyle choices, including:  Daily care of your teeth and gums.  Regular physical activity.  Eating a healthy diet.  Avoiding tobacco and drug use.  Limiting alcohol use.  Practicing safe sex.  Taking low-dose aspirin every day.  Taking vitamin and mineral supplements as recommended by your health care provider. What happens during an annual well check? The services and screenings done by your health care provider during your annual well check will depend on your age, overall health, lifestyle risk factors, and family history of disease. Counseling  Your health care provider may ask you questions about your:  Alcohol  use.  Tobacco use.  Drug use.  Emotional well-being.  Home and relationship well-being.  Sexual activity.  Eating habits.  History of falls.  Memory and ability to understand (cognition).  Work and work Statistician.  Reproductive health. Screening  You may have the following tests or measurements:  Height, weight, and BMI.  Blood pressure.  Lipid and cholesterol levels. These may be checked every 5 years, or more frequently if you are over 64 years old.  Skin check.  Lung cancer screening. You may have this screening every year starting at age 67 if you have a 30-pack-year history of smoking and currently smoke or have quit within the past 15 years.  Fecal occult blood test (FOBT) of the stool. You may have this test every year starting at age 36.  Flexible sigmoidoscopy or colonoscopy. You may have a sigmoidoscopy every 5 years or a colonoscopy every 10 years starting at age 51.  Hepatitis C blood test.  Hepatitis B blood test.  Sexually transmitted disease (STD) testing.  Diabetes screening. This is done by checking your blood sugar (glucose) after you have not eaten for a while (fasting). You may have this done every 1-3 years.  Bone density scan. This is done to screen for osteoporosis. You may have this done starting at age 39.  Mammogram. This may be done every 1-2 years. Talk to your health care provider about how often you should have regular mammograms. Talk with your health care provider about your test results, treatment options, and if necessary, the need for more tests. Vaccines  Your health care provider may recommend certain vaccines, such as:  Influenza vaccine. This is recommended every year.  Tetanus, diphtheria,  and acellular pertussis (Tdap, Td) vaccine. You may need a Td booster every 10 years.  Zoster vaccine. You may need this after age 70.  Pneumococcal 13-valent conjugate (PCV13) vaccine. One dose is recommended after age  33.  Pneumococcal polysaccharide (PPSV23) vaccine. One dose is recommended after age 23. Talk to your health care provider about which screenings and vaccines you need and how often you need them. This information is not intended to replace advice given to you by your health care provider. Make sure you discuss any questions you have with your health care provider. Document Released: 07/21/2015 Document Revised: 03/13/2016 Document Reviewed: 04/25/2015 Elsevier Interactive Patient Education  2017 [place] Prevention in the Home Falls can cause injuries. They can happen to people of all ages. There are many things you can do to make your home safe and to help prevent falls. What can I do on the outside of my home?  Regularly fix the edges of walkways and driveways and fix any cracks.  Remove anything that might make you trip as you walk through a door, such as a raised step or threshold.  Trim any bushes or trees on the path to your home.  Use bright outdoor lighting.  Clear any walking paths of anything that might make someone trip, such as rocks or tools.  Regularly check to see if handrails are loose or broken. Make sure that both sides of any steps have handrails.  Any raised decks and porches should have guardrails on the edges.  Have any leaves, snow, or ice cleared regularly.  Use sand or salt on walking paths during winter.  Clean up any spills in your garage right away. This includes oil or grease spills. What can I do in the bathroom?  Use night lights.  Install grab bars by the toilet and in the tub and shower. Do not use towel bars as grab bars.  Use non-skid mats or decals in the tub or shower.  If you need to sit down in the shower, use a plastic, non-slip stool.  Keep the floor dry. Clean up any water that spills on the floor as soon as it happens.  Remove soap buildup in the tub or shower regularly.  Attach bath mats securely with double-sided  non-slip rug tape.  Do not have throw rugs and other things on the floor that can make you trip. What can I do in the bedroom?  Use night lights.  Make sure that you have a light by your bed that is easy to reach.  Do not use any sheets or blankets that are too big for your bed. They should not hang down onto the floor.  Have a firm chair that has side arms. You can use this for support while you get dressed.  Do not have throw rugs and other things on the floor that can make you trip. What can I do in the kitchen?  Clean up any spills right away.  Avoid walking on wet floors.  Keep items that you use a lot in easy-to-reach places.  If you need to reach something above you, use a strong step stool that has a grab bar.  Keep electrical cords out of the way.  Do not use floor polish or wax that makes floors slippery. If you must use wax, use non-skid floor wax.  Do not have throw rugs and other things on the floor that can make you trip. What can I do with my  stairs?  Do not leave any items on the stairs.  Make sure that there are handrails on both sides of the stairs and use them. Fix handrails that are broken or loose. Make sure that handrails are as long as the stairways.  Check any carpeting to make sure that it is firmly attached to the stairs. Fix any carpet that is loose or worn.  Avoid having throw rugs at the top or bottom of the stairs. If you do have throw rugs, attach them to the floor with carpet tape.  Make sure that you have a light switch at the top of the stairs and the bottom of the stairs. If you do not have them, ask someone to add them for you. What else can I do to help prevent falls?  Wear shoes that:  Do not have high heels.  Have rubber bottoms.  Are comfortable and fit you well.  Are closed at the toe. Do not wear sandals.  If you use a stepladder:  Make sure that it is fully opened. Do not climb a closed stepladder.  Make sure that both  sides of the stepladder are locked into place.  Ask someone to hold it for you, if possible.  Clearly mark and make sure that you can see:  Any grab bars or handrails.  First and last steps.  Where the edge of each step is.  Use tools that help you move around (mobility aids) if they are needed. These include:  Canes.  Walkers.  Scooters.  Crutches.  Turn on the lights when you go into a dark area. Replace any light bulbs as soon as they burn out.  Set up your furniture so you have a clear path. Avoid moving your furniture around.  If any of your floors are uneven, fix them.  If there are any pets around you, be aware of where they are.  Review your medicines with your doctor. Some medicines can make you feel dizzy. This can increase your chance of falling. Ask your doctor what other things that you can do to help prevent falls. This information is not intended to replace advice given to you by your health care provider. Make sure you discuss any questions you have with your health care provider. Document Released: 04/20/2009 Document Revised: 11/30/2015 Document Reviewed: 07/29/2014 Elsevier Interactive Patient Education  2017 [employer].

## 2020-02-23 DIAGNOSIS — H60543 Acute eczematoid otitis externa, bilateral: Secondary | ICD-10-CM | POA: Diagnosis not present

## 2020-02-23 DIAGNOSIS — J309 Allergic rhinitis, unspecified: Secondary | ICD-10-CM | POA: Diagnosis not present

## 2020-02-23 DIAGNOSIS — H6121 Impacted cerumen, right ear: Secondary | ICD-10-CM | POA: Diagnosis not present

## 2020-03-04 ENCOUNTER — Other Ambulatory Visit: Payer: Self-pay | Admitting: Family Medicine

## 2020-03-04 DIAGNOSIS — I82412 Acute embolism and thrombosis of left femoral vein: Secondary | ICD-10-CM

## 2020-03-04 NOTE — Telephone Encounter (Signed)
Requested Prescriptions  Pending Prescriptions Disp Refills  . ELIQUIS 5 MG TABS tablet [Pharmacy Med Name: ELIQUIS 5 MG TAB] 180 tablet 0    Sig: TAKE 1 TABLET BY MOUTH TWICE A DAY     Hematology:  Anticoagulants Failed - 03/04/2020 10:20 AM      Failed - HGB in normal range and within 360 days    Hemoglobin  Date Value Ref Range Status  10/25/2019 9.2 (L) 11.1 - 15.9 g/dL Final         Failed - PLT in normal range and within 360 days    Platelets  Date Value Ref Range Status  10/25/2019 729 (H) 150 - 450 x10E3/uL Final         Failed - HCT in normal range and within 360 days    Hematocrit  Date Value Ref Range Status  10/25/2019 29.0 (L) 34.0 - 46.6 % Final         Passed - Cr in normal range and within 360 days    Creatinine, Ser  Date Value Ref Range Status  10/25/2019 0.66 0.57 - 1.00 mg/dL Final         Passed - Valid encounter within last 12 months    Recent Outpatient Visits          3 months ago Adjustment disorder with anxiety   [facility] [place], Dionne Bucy, MD   4 months ago Acute deep vein thrombosis (DVT) of iliac vein of left lower extremity [facility])   [facility], Dionne Bucy, MD   4 months ago Acute deep vein thrombosis (DVT) of iliac vein of left lower extremity [facility])   [facility], Dionne Bucy, MD   5 months ago Cellulitis of left lower extremity   [facility] [place], Dionne Bucy, MD   5 months ago Calculus of gallbladder without cholecystitis without obstruction   [facility], Dionne Bucy, MD      Future Appointments            In 3 months Bacigalupo, Dionne Bucy, MD [facility], [place]

## 2020-03-06 DIAGNOSIS — Z85820 Personal history of malignant melanoma of skin: Secondary | ICD-10-CM | POA: Diagnosis not present

## 2020-03-06 DIAGNOSIS — D225 Melanocytic nevi of trunk: Secondary | ICD-10-CM | POA: Diagnosis not present

## 2020-03-06 DIAGNOSIS — Z85828 Personal history of other malignant neoplasm of skin: Secondary | ICD-10-CM | POA: Diagnosis not present

## 2020-03-06 DIAGNOSIS — D2261 Melanocytic nevi of right upper limb, including shoulder: Secondary | ICD-10-CM | POA: Diagnosis not present

## 2020-03-06 DIAGNOSIS — D2271 Melanocytic nevi of right lower limb, including hip: Secondary | ICD-10-CM | POA: Diagnosis not present

## 2020-03-09 ENCOUNTER — Telehealth: Payer: Self-pay

## 2020-03-09 NOTE — Telephone Encounter (Signed)
Please advise 

## 2020-03-09 NOTE — Telephone Encounter (Signed)
Patient will start Gatifloxacin 1 drop TID two days prior to surgery and 7 full days after surgery. Fluorometholone 1 drop 4 times daily two days prior to surgery and after surgery will be determined after surgery.

## 2020-03-09 NOTE — Telephone Encounter (Signed)
We could see her for the foot.  I need to know more about the eye procedure that she is talking about to determine need to hold Eliquis.

## 2020-03-09 NOTE — Telephone Encounter (Signed)
Copied from [place] [id number]. Topic: General - Inquiry >> Mar 09, 2020  9:21 AM Scherrie Gerlach wrote: Reason for CRM: 1. pt states she is having a callus taken off her eye, and she wants to know how much ahead of time she should stop her eliquis. 2.  Pt having pain in her ankle/across the the top of her foot, (same leg she had clots in).  She wants to know should she come to see Dr B for this issue, or go back to the vein dr that she saw for the clots? Pt would like to know something pretty.

## 2020-03-10 NOTE — Telephone Encounter (Signed)
Can hold Eliquis for 5 days prior to surgery and resume day after surgery.

## 2020-03-10 NOTE — Telephone Encounter (Signed)
Patient advised as below. Patient verbalizes understanding and is in agreement with treatment plan.  

## 2020-03-17 ENCOUNTER — Encounter: Payer: Self-pay | Admitting: Family Medicine

## 2020-03-17 ENCOUNTER — Other Ambulatory Visit: Payer: Self-pay

## 2020-03-17 ENCOUNTER — Ambulatory Visit (INDEPENDENT_AMBULATORY_CARE_PROVIDER_SITE_OTHER): Payer: Medicare Other | Admitting: Family Medicine

## 2020-03-17 VITALS — BP 122/67 | HR 68 | Temp 98.5°F | Resp 16 | Wt 122.0 lb

## 2020-03-17 DIAGNOSIS — I82412 Acute embolism and thrombosis of left femoral vein: Secondary | ICD-10-CM

## 2020-03-17 DIAGNOSIS — F4322 Adjustment disorder with anxiety: Secondary | ICD-10-CM

## 2020-03-17 NOTE — Assessment & Plan Note (Signed)
Improved Stopped clonazepam 2 months ago Good support system Continue sertraline, consider taper in future

## 2020-03-17 NOTE — Progress Notes (Signed)
I,April Miller,acting as a scribe for Lavon Paganini, MD.,have documented all relevant documentation on the behalf of Lavon Paganini, MD,as directed by  Lavon Paganini, MD while in the presence of Lavon Paganini, MD.   Established patient visit   Patient: Rita Webb   DOB: [birth year]   74 y.o. Female  MRN: [id number] Visit Date: 03/17/2020  Today's healthcare provider: Lavon Paganini, MD   Chief Complaint  Patient presents with  . Foot Pain   Subjective    HPI  Rita Webb is here concerning left foot pain. Rita Webb had a DVT in the upper left leg in April (5 months ago) which was removed. Since then she has had some continued slight swelling the left food and occasional pain. The pain is located on the outside ankle and over the arch of the left foot. This pain has woken her up twice at night.  She has foot increased pain with certain movements or when going down steps. She has not taken any additional medications for this foot pain. She keeps the foot elevated when she is sitting.  In July she noticed this same area was bruised. She is not sure how long the bruising has been present. Sent pictures to hematologist in July/August but never got a reply  Some also has some pain in the left groin which also been there since the surgery. This occurs only when she "moves funky".  In regards to her mood, Rita Webb reported she is feeling much better and has had a really good summer. She is still taking her sertraline. She stopped the klonopin over 2 months ago.   Patient Active Problem List   Diagnosis Date Noted  . 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 Adjustment disorder with anxiety 10/20/2019  . Physical deconditioning 10/20/2019  . DVT (deep venous thrombosis) ([facility]) 10/13/2019  . History of 2019 novel coronavirus disease (COVID-19) 10/11/2019  . Renal mass, right 09/13/2019  . Calculus of gallbladder without cholecystitis without obstruction  09/13/2019  . Gallstones, common bile duct 09/10/2019  . Family history of breast cancer in mother 02/13/2017  . Osteoporosis 02/27/2015  . Allergic rhinitis 11/11/2014  . Hypercholesteremia 11/11/2014  . History of malignant melanoma of skin 11/11/2014  . Avitaminosis D 11/11/2014  . 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 Allergy   . Hyperlipidemia   . Renal mass    Social History   Tobacco Use  . Smoking status: Former Smoker    Quit date: 11/10/1968    Years since quitting: 51.3  . Smokeless tobacco: Never Used  Vaping Use  . Vaping Use: Never used  Substance Use Topics  . Alcohol use: No  . Drug use: No       Medications: Outpatient Medications Prior to Visit  Medication Sig  . acetaminophen (TYLENOL) 325 MG tablet Take 650 mg by mouth every 6 (six) hours as needed.  Marland Kitchen amoxicillin (AMOXIL) 500 MG capsule Take 500 mg by mouth 3 (three) times daily.  . B COMPLEX-C PO Take 1 tablet by mouth daily.   . Calcium Carbonate-Vitamin D 600-200 MG-UNIT CAPS Take 2 capsules by mouth daily.   . Cetirizine HCl 10 MG CAPS Take 1 capsule by mouth daily.   . Cholecalciferol 1000 UNITS capsule Take 1,000 Units by mouth daily.   Marland Kitchen ELIQUIS 5 MG TABS tablet TAKE 1 TABLET BY MOUTH TWICE A DAY  .  mometasone (ELOCON) 0.1 % lotion Apply topically.  . MULTIPLE VITAMIN PO Take 1 tablet by mouth daily.   . Omega-3 Fatty Acids (FISH OIL) 1200 MG CAPS Take 1 capsule by mouth daily.   . psyllium (REGULOID) 0.52 G capsule Take 1 capsule by mouth 3 (three) times daily.   . sertraline (ZOLOFT) 50 MG tablet TAKE 1 TABLET BY MOUTH ONCE A DAY  . simvastatin (ZOCOR) 10 MG tablet TAKE ONE TABLET BY MOUTH AT BEDTIME  . [DISCONTINUED] acetaminophen (TYLENOL) 500 MG tablet Take 500 mg by mouth every 6 (six) hours as needed. (Patient not taking: Reported on 02/22/2020)  . [DISCONTINUED] clonazePAM (KLONOPIN) 0.5 MG tablet TAKE 1/2 TO 1  TABLET BY MOUTH 2 TIMES DAILY AS NEEDED FOR ANXIETY. (Patient not taking: Reported on 02/22/2020)  . [DISCONTINUED] fluticasone (FLONASE) 50 MCG/ACT nasal spray Place 1 spray into the nose daily. (Patient not taking: Reported on 03/17/2020)  . [DISCONTINUED] montelukast (SINGULAIR) 10 MG tablet Take 10 mg by mouth at bedtime.  (Patient not taking: Reported on 03/17/2020)  . [DISCONTINUED] UNABLE TO FIND Med Name: Allergy Shots once a week (Patient not taking: Reported on 02/22/2020)   No facility-administered medications prior to visit.    Review of Systems  Constitutional: Negative for appetite change, chills, fatigue and fever.  Eyes: Negative for visual disturbance.  Respiratory: Negative for chest tightness and shortness of breath.   Cardiovascular: Positive for leg swelling. Negative for chest pain and palpitations.  Gastrointestinal: Negative for abdominal pain, nausea and vomiting.  Neurological: Negative for dizziness, weakness and headaches.    Last CBC Lab Results  Component Value Date   WBC 8.4 10/25/2019   HGB 9.2 (L) 10/25/2019   HCT 29.0 (L) 10/25/2019   MCV 93 10/25/2019   MCH 29.4 10/25/2019   RDW 13.2 10/25/2019   PLT 729 (H) 10/25/2019      Objective    BP 122/67 (BP Location: Left Arm, Patient Position: Sitting, Cuff Size: Normal)   Pulse 68   Temp 98.5 F (36.9 C) (Oral)   Resp 16   Wt 122 lb (55.3 kg)   SpO2 95%   BMI 20.30 kg/m   PHQ9 SCORE ONLY 03/17/2020 02/22/2020 02/18/2019  PHQ-9 Total Score 1 0 1   GAD 7 : Generalized Anxiety Score 03/17/2020  Nervous, Anxious, on Edge 1  Control/stop worrying 0  Worry too much - different things 0  Trouble relaxing 0  Restless 0  Easily annoyed or irritable 0  Afraid - awful might happen 1  Total GAD 7 Score 2  Anxiety Difficulty Not difficult at all   Physical Exam Constitutional:      General: She is not in acute distress.    Appearance: Normal appearance. She is normal weight. She is not ill-appearing,  toxic-appearing or diaphoretic.  HENT:     Head: Normocephalic.  Cardiovascular:     Rate and Rhythm: Normal rate and regular rhythm.     Heart sounds: Normal heart sounds. No murmur heard.      Comments: Varicosities of lower feet and and legs, bilaterally; increased superficial veins on left foot Pulmonary:     Effort: Pulmonary effort is normal. No respiratory distress.     Breath sounds: Normal breath sounds. No wheezing.  Musculoskeletal:     Right foot: No tenderness.     Left foot: Swelling present. No tenderness.  Neurological:     General: No focal deficit present.     Mental Status: She is alert. Mental  status is at baseline.     Gait: Gait normal.  Psychiatric:        Mood and Affect: Mood normal.        Behavior: Behavior normal.        Thought Content: Thought content normal.        Judgment: Judgment normal.     No results found for any visits on 03/17/20.  Assessment & Plan     Problem List Items Addressed This Visit      Cardiovascular and Mediastinum   DVT (deep venous thrombosis) (HCC)    DVT of left lower extremity in April 2021 Received thrombectomy, stent, and IVC filter On Eliquis 5mg  BID Continued swelling and occasional pain of left foot, likely related to vascular changes s/p DVT, as well as varicose and spider veins Encouraged to use compression stockings Advised on elevation No signs of recurrent DVT on exam today Will continue Eliquis for at least 6 months after surgery  Follows up with Hematology in December for consideration of discontinuing Needs IVC filter retrieved at some point as well        Other   Adjustment disorder with anxiety - Primary    Improved Stopped clonazepam 2 months ago Good support system Continue sertraline, consider taper in future          No follow-ups on file.  Next appointment: 50/56/9794     Rodrigo Ran, MS3   Patient seen along with MS3 student Rodrigo Ran. I personally evaluated this  patient along with the student, and verified all aspects of the history, physical exam, and medical decision making as documented by the student. I agree with the student's documentation and have made all necessary edits.  Dona Walby, Dionne Bucy, MD, MPH [facility]

## 2020-03-17 NOTE — Assessment & Plan Note (Addendum)
DVT of left lower extremity in April 2021 Received thrombectomy, stent, and IVC filter On Eliquis 5mg  BID Continued swelling and occasional pain of left foot, likely related to vascular changes s/p DVT, as well as varicose and spider veins Encouraged to use compression stockings Advised on elevation No signs of recurrent DVT on exam today Will continue Eliquis for at least 6 months after surgery  Follows up with Hematology in December for consideration of discontinuing Needs IVC filter retrieved at some point as well

## 2020-03-21 DIAGNOSIS — J301 Allergic rhinitis due to pollen: Secondary | ICD-10-CM | POA: Diagnosis not present

## 2020-03-22 DIAGNOSIS — J300 Vasomotor rhinitis: Secondary | ICD-10-CM | POA: Diagnosis not present

## 2020-03-22 DIAGNOSIS — J309 Allergic rhinitis, unspecified: Secondary | ICD-10-CM | POA: Diagnosis not present

## 2020-03-24 DIAGNOSIS — J301 Allergic rhinitis due to pollen: Secondary | ICD-10-CM | POA: Diagnosis not present

## 2020-03-27 DIAGNOSIS — R918 Other nonspecific abnormal finding of lung field: Secondary | ICD-10-CM | POA: Diagnosis not present

## 2020-03-27 DIAGNOSIS — Z87891 Personal history of nicotine dependence: Secondary | ICD-10-CM | POA: Diagnosis not present

## 2020-03-27 DIAGNOSIS — Z85528 Personal history of other malignant neoplasm of kidney: Secondary | ICD-10-CM | POA: Diagnosis not present

## 2020-03-27 DIAGNOSIS — N200 Calculus of kidney: Secondary | ICD-10-CM | POA: Diagnosis not present

## 2020-03-27 DIAGNOSIS — T82868D Thrombosis of vascular prosthetic devices, implants and grafts, subsequent encounter: Secondary | ICD-10-CM | POA: Diagnosis not present

## 2020-03-27 DIAGNOSIS — Z905 Acquired absence of kidney: Secondary | ICD-10-CM | POA: Diagnosis not present

## 2020-03-27 DIAGNOSIS — C641 Malignant neoplasm of right kidney, except renal pelvis: Secondary | ICD-10-CM | POA: Diagnosis not present

## 2020-03-27 DIAGNOSIS — Z08 Encounter for follow-up examination after completed treatment for malignant neoplasm: Secondary | ICD-10-CM | POA: Diagnosis not present

## 2020-03-28 DIAGNOSIS — R918 Other nonspecific abnormal finding of lung field: Secondary | ICD-10-CM | POA: Insufficient documentation

## 2020-03-30 DIAGNOSIS — Z23 Encounter for immunization: Secondary | ICD-10-CM | POA: Diagnosis not present

## 2020-03-30 DIAGNOSIS — R918 Other nonspecific abnormal finding of lung field: Secondary | ICD-10-CM | POA: Diagnosis not present

## 2020-03-30 DIAGNOSIS — J301 Allergic rhinitis due to pollen: Secondary | ICD-10-CM | POA: Diagnosis not present

## 2020-04-03 DIAGNOSIS — J301 Allergic rhinitis due to pollen: Secondary | ICD-10-CM | POA: Diagnosis not present

## 2020-04-06 DIAGNOSIS — J301 Allergic rhinitis due to pollen: Secondary | ICD-10-CM | POA: Diagnosis not present

## 2020-04-14 DIAGNOSIS — J301 Allergic rhinitis due to pollen: Secondary | ICD-10-CM | POA: Diagnosis not present

## 2020-04-17 DIAGNOSIS — J301 Allergic rhinitis due to pollen: Secondary | ICD-10-CM | POA: Diagnosis not present

## 2020-04-20 DIAGNOSIS — J301 Allergic rhinitis due to pollen: Secondary | ICD-10-CM | POA: Diagnosis not present

## 2020-04-24 DIAGNOSIS — J301 Allergic rhinitis due to pollen: Secondary | ICD-10-CM | POA: Diagnosis not present

## 2020-04-26 ENCOUNTER — Other Ambulatory Visit: Payer: Self-pay | Admitting: Family Medicine

## 2020-04-26 DIAGNOSIS — E7800 Pure hypercholesterolemia, unspecified: Secondary | ICD-10-CM

## 2020-04-27 DIAGNOSIS — J301 Allergic rhinitis due to pollen: Secondary | ICD-10-CM | POA: Diagnosis not present

## 2020-05-01 DIAGNOSIS — J301 Allergic rhinitis due to pollen: Secondary | ICD-10-CM | POA: Diagnosis not present

## 2020-05-04 DIAGNOSIS — J301 Allergic rhinitis due to pollen: Secondary | ICD-10-CM | POA: Diagnosis not present

## 2020-05-05 ENCOUNTER — Other Ambulatory Visit (INDEPENDENT_AMBULATORY_CARE_PROVIDER_SITE_OTHER): Payer: Self-pay | Admitting: Nurse Practitioner

## 2020-05-05 DIAGNOSIS — I82412 Acute embolism and thrombosis of left femoral vein: Secondary | ICD-10-CM

## 2020-05-08 ENCOUNTER — Ambulatory Visit (INDEPENDENT_AMBULATORY_CARE_PROVIDER_SITE_OTHER): Payer: Medicare Other

## 2020-05-08 ENCOUNTER — Encounter (INDEPENDENT_AMBULATORY_CARE_PROVIDER_SITE_OTHER): Payer: Self-pay | Admitting: Nurse Practitioner

## 2020-05-08 ENCOUNTER — Other Ambulatory Visit: Payer: Self-pay

## 2020-05-08 ENCOUNTER — Ambulatory Visit (INDEPENDENT_AMBULATORY_CARE_PROVIDER_SITE_OTHER): Payer: Medicare Other | Admitting: Nurse Practitioner

## 2020-05-08 VITALS — BP 124/71 | HR 72 | Ht 65.0 in | Wt 124.0 lb

## 2020-05-08 DIAGNOSIS — E7800 Pure hypercholesterolemia, unspecified: Secondary | ICD-10-CM | POA: Diagnosis not present

## 2020-05-08 DIAGNOSIS — Z95828 Presence of other vascular implants and grafts: Secondary | ICD-10-CM

## 2020-05-08 DIAGNOSIS — I82412 Acute embolism and thrombosis of left femoral vein: Secondary | ICD-10-CM

## 2020-05-08 DIAGNOSIS — J301 Allergic rhinitis due to pollen: Secondary | ICD-10-CM | POA: Diagnosis not present

## 2020-05-08 HISTORY — PX: EYE SURGERY: SHX253

## 2020-05-09 ENCOUNTER — Telehealth: Payer: Self-pay

## 2020-05-09 NOTE — Telephone Encounter (Signed)
Did you want to schedule a f/u visit sometime next year? Please advise. Thanks!

## 2020-05-09 NOTE — Telephone Encounter (Signed)
Yes. Maybe we could see her in the spring or after AWV in 8/22 for CPE.

## 2020-05-09 NOTE — Telephone Encounter (Signed)
PT cancel her Combs for 06/23/20 / Pt states she's been in the office to many times this year and no need for [facility], wants to speak with a nurse about this / please advise

## 2020-05-10 NOTE — Telephone Encounter (Signed)
Pt advised.  She wanted to know if she could just get some blood work done.  She states her A1C was slightly elevated last year.   Thanks,   -Mickel Baas

## 2020-05-11 DIAGNOSIS — J301 Allergic rhinitis due to pollen: Secondary | ICD-10-CM | POA: Diagnosis not present

## 2020-05-11 NOTE — Telephone Encounter (Signed)
Rechecking screening labs and a1c is part of the physical.  Maybe we should just do the physical like we planned.  Despite the other visits this year, this wellness exam still has value.

## 2020-05-11 NOTE — Telephone Encounter (Signed)
lmtcb

## 2020-05-12 NOTE — Telephone Encounter (Signed)
Patient is returning Suli's call. Please advise CB- [phone]

## 2020-05-15 ENCOUNTER — Encounter (INDEPENDENT_AMBULATORY_CARE_PROVIDER_SITE_OTHER): Payer: Self-pay | Admitting: Nurse Practitioner

## 2020-05-15 DIAGNOSIS — J301 Allergic rhinitis due to pollen: Secondary | ICD-10-CM | POA: Diagnosis not present

## 2020-05-15 NOTE — Telephone Encounter (Signed)
Appt scheduled

## 2020-05-15 NOTE — Progress Notes (Signed)
Subjective:    Patient ID: Rita Webb, female    DOB: 1946-04-01, 74 y.o.   MRN: [id number] Chief Complaint  Patient presents with  . Follow-up    41mo LLE DVT    Patient presents today to discuss removal of IVC filter.  Patient previously had her IVC filter placed on 10/14/2019 following extensive DVT.  IVC filter was left in place because the patient was discovered to have renal cell carcinoma.  Since her last visit that has been removed and there has been no evidence of metastasis.  Patient has not needed to undergo radiation or chemotherapy.  However there has been a nodule detected on her lung.  Currently it is unknown whether this is benign or possibly malignant but the patient does have upcoming studies for further evaluation.  Currently the patient is on Eliquis and she is doing well with Eliquis.  She denies any excessive swelling or postphlebitic pain.  Today noninvasive studies show no evidence of acute DVT.  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   Review of Systems  Hematological: Bruises/bleeds easily.  All other systems reviewed and are negative.      Objective:   Physical Exam Vitals reviewed.  HENT:     Head: Normocephalic.  Cardiovascular:     Rate and Rhythm: Normal rate.     Pulses: Normal pulses.  Pulmonary:     Effort: Pulmonary effort is normal.  Musculoskeletal:     Right lower leg: No edema.     Left lower leg: No edema.  Neurological:     Mental Status: She is alert and oriented to person, place, and time.  Psychiatric:        Mood and Affect: Mood normal.        Behavior: Behavior normal.        Thought Content: Thought content normal.        Judgment: Judgment normal.     BP 124/71   Pulse 72   Ht 5\' 5"  (1.651 m)   Wt 124 lb (56.2 kg)   BMI 20.63 kg/m   Past Medical History:  Diagnosis Date  . Allergy   . Hyperlipidemia   . Renal mass     Social  History   Socioeconomic History  . Marital status: Married    Spouse name: Not on file  . Number of children: 2  . Years of education: Not on file  . Highest education level: Some college, no degree  Occupational History  . Occupation: retired  Tobacco Use  . Smoking status: Former Smoker    Quit date: 11/10/1968    Years since quitting: 51.5  . Smokeless tobacco: Never Used  Vaping Use  . Vaping Use: Never used  Substance and Sexual Activity  . Alcohol use: No  . Drug use: No  . Sexual activity: Not on file  Other Topics Concern  . Not on file  Social History Narrative  . Not on file   Social Determinants of Health   Financial Resource Strain: Low Risk   . Difficulty of Paying Living Expenses: Not hard at all  Food Insecurity: No Food Insecurity  . Worried About Charity fundraiser in the Last Year: Never true  . Ran Out of Food in the Last Year: Never true  Transportation Needs: No Transportation Needs  . Lack of Transportation (Medical): No  . Lack of Transportation (Non-Medical): No  Physical Activity: Insufficiently Active  . Days of Exercise per Week: 5 days  . Minutes of Exercise per Session: 10 min  Stress: No Stress Concern Present  . Feeling of Stress : Only a little  Social Connections: Moderately Integrated  . Frequency of Communication with Friends and Family: More than three times a week  . Frequency of Social Gatherings with Friends and Family: More than three times a week  . Attends Religious Services: More than 4 times per year  . Active Member of Clubs or Organizations: No  . Attends Archivist Meetings: Never  . Marital Status: Married  Human resources officer Violence: Not At Risk  . Fear of Current or Ex-Partner: No  . Emotionally Abused: No  . Physically Abused: No  . Sexually Abused: No    Past Surgical History:  Procedure Laterality Date  . ADRENALECTOMY    . CHOLECYSTECTOMY    . GANGLION CYST EXCISION Right 1996   wrist  .  LAPAROSCOPIC NEPHRECTOMY    . MELANOMA EXCISION  08/2011  . PERIPHERAL VASCULAR THROMBECTOMY Left 10/14/2019   Procedure: LEFT LOWER EXTREMITY VENOUS LYSIS / THROMBECTOMY WITH IVF FILTER;  Surgeon: Algernon Huxley, MD;  Location: [facility];  Service: Cardiovascular;  Laterality: Left;  . SPINE SURGERY      Family History  Problem Relation Age of Onset  . Breast cancer Mother 8  . Prostate cancer Father   . Diabetes Maternal Aunt   . Diabetes Maternal Uncle     Allergies  Allergen Reactions  . Cefuroxime Diarrhea    CBC Latest Ref Rng & Units 10/25/2019 10/25/2019 10/20/2019  WBC 3.4 - 10.8 x10E3/uL 8.4 8.3 10.3  Hemoglobin 11.1 - 15.9 g/dL 9.2(L) 9.1(L) 9.1(L)  Hematocrit 34.0 - 46.6 % 29.0(L) 28.4(L) 27.6(L)  Platelets 150 - 450 x10E3/uL 729(H) 700(H) 714(H)      CMP     Component Value Date/Time   NA 141 10/25/2019 1100   K 4.8 10/25/2019 1100   CL 100 10/25/2019 1100   CO2 24 10/25/2019 1100   GLUCOSE 82 10/25/2019 1100   GLUCOSE 109 (H) 10/15/2019 0053   BUN 14 10/25/2019 1100   CREATININE 0.66 10/25/2019 1100   CALCIUM 9.0 10/25/2019 1100   PROT 6.4 (L) 10/13/2019 1913   PROT 7.1 09/15/2019 1153   ALBUMIN 2.8 (L) 10/13/2019 1913   ALBUMIN 4.8 (H) 09/15/2019 1153   AST 25 10/13/2019 1913   ALT 40 10/13/2019 1913   ALKPHOS 84 10/13/2019 1913   BILITOT 0.6 10/13/2019 1913   BILITOT 0.4 09/15/2019 1153   GFRNONAA 89 10/25/2019 1100   GFRAA 102 10/25/2019 1100         Assessment & Plan:   1. Presence of IVC filter Following treatment for her renal carcinoma, lung nodules were discovered.  She has an upcoming consultation to determine if these are cancerous or something that should be watched on a more regular basis.  I have an abundance of caution in case a biopsy or surgery is necessary, we will leave the IVC filter in place at this time until the patient has had her follow-up visits.  The patient will follow up in 3 months to discuss possible IVC  filter removal.  2. 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  Patient does not have any postphlebitic symptoms and little swelling.  Overall she is doing well.  Patient will continue Eliquis  until her follow-up visit.  3. Hypercholesteremia Continue statin as ordered and reviewed, no changes at this time    Current Outpatient Medications on File Prior to Visit  Medication Sig Dispense Refill  . acetaminophen (TYLENOL) 325 MG tablet Take 650 mg by mouth every 6 (six) hours as needed.    Marland Kitchen amoxicillin (AMOXIL) 500 MG capsule Take 500 mg by mouth 3 (three) times daily.    . B COMPLEX-C PO Take 1 tablet by mouth daily.     . Calcium Carbonate-Vitamin D 600-200 MG-UNIT CAPS Take 2 capsules by mouth daily.     . Cetirizine HCl 10 MG CAPS Take 1 capsule by mouth daily.     . Cholecalciferol 1000 UNITS capsule Take 1,000 Units by mouth daily.     Marland Kitchen ELIQUIS 5 MG TABS tablet TAKE 1 TABLET BY MOUTH TWICE A DAY 180 tablet 0  . mometasone (ELOCON) 0.1 % lotion Apply topically.    . MULTIPLE VITAMIN PO Take 1 tablet by mouth daily.     . Omega-3 Fatty Acids (FISH OIL) 1200 MG CAPS Take 1 capsule by mouth daily.     . psyllium (REGULOID) 0.52 G capsule Take 1 capsule by mouth 3 (three) times daily.     . sertraline (ZOLOFT) 50 MG tablet TAKE 1 TABLET BY MOUTH ONCE A DAY 90 tablet 1  . simvastatin (ZOCOR) 10 MG tablet TAKE ONE TABLET BY MOUTH AT BEDTIME 90 tablet 1  . fluticasone (FLONASE) 50 MCG/ACT nasal spray Place into both nostrils.     No current facility-administered medications on file prior to visit.    There are no Patient Instructions on file for this visit. No follow-ups on file.   Kris Hartmann, NP

## 2020-05-18 DIAGNOSIS — J301 Allergic rhinitis due to pollen: Secondary | ICD-10-CM | POA: Diagnosis not present

## 2020-05-19 DIAGNOSIS — J301 Allergic rhinitis due to pollen: Secondary | ICD-10-CM | POA: Diagnosis not present

## 2020-05-22 DIAGNOSIS — J301 Allergic rhinitis due to pollen: Secondary | ICD-10-CM | POA: Diagnosis not present

## 2020-05-25 DIAGNOSIS — J301 Allergic rhinitis due to pollen: Secondary | ICD-10-CM | POA: Diagnosis not present

## 2020-05-29 DIAGNOSIS — J301 Allergic rhinitis due to pollen: Secondary | ICD-10-CM | POA: Diagnosis not present

## 2020-06-05 DIAGNOSIS — J301 Allergic rhinitis due to pollen: Secondary | ICD-10-CM | POA: Diagnosis not present

## 2020-06-09 DIAGNOSIS — Z95828 Presence of other vascular implants and grafts: Secondary | ICD-10-CM | POA: Diagnosis not present

## 2020-06-09 DIAGNOSIS — R918 Other nonspecific abnormal finding of lung field: Secondary | ICD-10-CM | POA: Diagnosis not present

## 2020-06-09 DIAGNOSIS — C641 Malignant neoplasm of right kidney, except renal pelvis: Secondary | ICD-10-CM | POA: Diagnosis not present

## 2020-06-09 DIAGNOSIS — Z86718 Personal history of other venous thrombosis and embolism: Secondary | ICD-10-CM | POA: Diagnosis not present

## 2020-06-09 DIAGNOSIS — Z7901 Long term (current) use of anticoagulants: Secondary | ICD-10-CM | POA: Diagnosis not present

## 2020-06-12 ENCOUNTER — Other Ambulatory Visit: Payer: Self-pay | Admitting: Family Medicine

## 2020-06-12 DIAGNOSIS — I82412 Acute embolism and thrombosis of left femoral vein: Secondary | ICD-10-CM

## 2020-06-12 DIAGNOSIS — J301 Allergic rhinitis due to pollen: Secondary | ICD-10-CM | POA: Diagnosis not present

## 2020-06-19 DIAGNOSIS — J301 Allergic rhinitis due to pollen: Secondary | ICD-10-CM | POA: Diagnosis not present

## 2020-06-23 ENCOUNTER — Encounter: Payer: Self-pay | Admitting: Family Medicine

## 2020-06-26 DIAGNOSIS — J301 Allergic rhinitis due to pollen: Secondary | ICD-10-CM | POA: Diagnosis not present

## 2020-06-29 DIAGNOSIS — C649 Malignant neoplasm of unspecified kidney, except renal pelvis: Secondary | ICD-10-CM | POA: Diagnosis not present

## 2020-06-29 DIAGNOSIS — R918 Other nonspecific abnormal finding of lung field: Secondary | ICD-10-CM | POA: Diagnosis not present

## 2020-06-29 DIAGNOSIS — Z23 Encounter for immunization: Secondary | ICD-10-CM | POA: Diagnosis not present

## 2020-07-08 DIAGNOSIS — C3490 Malignant neoplasm of unspecified part of unspecified bronchus or lung: Secondary | ICD-10-CM

## 2020-07-08 HISTORY — DX: Malignant neoplasm of unspecified part of unspecified bronchus or lung: C34.90

## 2020-07-10 DIAGNOSIS — J301 Allergic rhinitis due to pollen: Secondary | ICD-10-CM | POA: Diagnosis not present

## 2020-07-17 DIAGNOSIS — J301 Allergic rhinitis due to pollen: Secondary | ICD-10-CM | POA: Diagnosis not present

## 2020-07-26 ENCOUNTER — Other Ambulatory Visit: Payer: Self-pay | Admitting: Family Medicine

## 2020-07-31 DIAGNOSIS — J301 Allergic rhinitis due to pollen: Secondary | ICD-10-CM | POA: Diagnosis not present

## 2020-08-01 ENCOUNTER — Telehealth: Payer: Self-pay

## 2020-08-01 NOTE — Telephone Encounter (Signed)
Rescheduled to 08/28/20

## 2020-08-01 NOTE — Telephone Encounter (Signed)
Copied from [place] [id number]. Topic: General - Other >> Aug 01, 2020 10:02 AM Leward Quan A wrote: Reason for CRM: Patient called in to inform Dr B that she was with someone over the weekend and they tested positive for Covid on 07/31/20 say that she has no symptoms but need to know what to do about her upcoming appointment. Please call   Ph# [phone]

## 2020-08-03 ENCOUNTER — Encounter: Payer: Self-pay | Admitting: Family Medicine

## 2020-08-07 DIAGNOSIS — J301 Allergic rhinitis due to pollen: Secondary | ICD-10-CM | POA: Diagnosis not present

## 2020-08-08 ENCOUNTER — Encounter (INDEPENDENT_AMBULATORY_CARE_PROVIDER_SITE_OTHER): Payer: Self-pay

## 2020-08-08 ENCOUNTER — Ambulatory Visit (INDEPENDENT_AMBULATORY_CARE_PROVIDER_SITE_OTHER): Payer: Medicare Other | Admitting: Vascular Surgery

## 2020-08-08 ENCOUNTER — Other Ambulatory Visit: Payer: Self-pay

## 2020-08-09 DIAGNOSIS — H52213 Irregular astigmatism, bilateral: Secondary | ICD-10-CM | POA: Diagnosis not present

## 2020-08-09 DIAGNOSIS — H18453 Nodular corneal degeneration, bilateral: Secondary | ICD-10-CM | POA: Diagnosis not present

## 2020-08-09 DIAGNOSIS — H2513 Age-related nuclear cataract, bilateral: Secondary | ICD-10-CM | POA: Diagnosis not present

## 2020-08-09 DIAGNOSIS — H25043 Posterior subcapsular polar age-related cataract, bilateral: Secondary | ICD-10-CM | POA: Diagnosis not present

## 2020-08-11 DIAGNOSIS — J301 Allergic rhinitis due to pollen: Secondary | ICD-10-CM | POA: Diagnosis not present

## 2020-08-14 DIAGNOSIS — J301 Allergic rhinitis due to pollen: Secondary | ICD-10-CM | POA: Diagnosis not present

## 2020-08-21 DIAGNOSIS — J301 Allergic rhinitis due to pollen: Secondary | ICD-10-CM | POA: Diagnosis not present

## 2020-08-22 ENCOUNTER — Ambulatory Visit (INDEPENDENT_AMBULATORY_CARE_PROVIDER_SITE_OTHER): Payer: Medicare Other | Admitting: Vascular Surgery

## 2020-08-22 ENCOUNTER — Other Ambulatory Visit: Payer: Self-pay

## 2020-08-22 VITALS — BP 139/76 | HR 67 | Ht 65.0 in | Wt 132.0 lb

## 2020-08-22 DIAGNOSIS — E7800 Pure hypercholesterolemia, unspecified: Secondary | ICD-10-CM

## 2020-08-22 DIAGNOSIS — Z95828 Presence of other vascular implants and grafts: Secondary | ICD-10-CM

## 2020-08-22 DIAGNOSIS — I82512 Chronic embolism and thrombosis of left femoral vein: Secondary | ICD-10-CM | POA: Diagnosis not present

## 2020-08-22 NOTE — Progress Notes (Signed)
MRN : [id number]  Rita Webb is a 75 y.o. (1946-03-17) female who presents with chief complaint of  Chief Complaint  Patient presents with  . Follow-up    3 Mo no studies  .  History of Present Illness: Patient returns today in follow up of her DVT and previous IVC filter placement.  Her legs are doing well.  She remains on anticoagulation.  She had thrombectomy about 9 months ago and the filter was placed at that time.  We held off on removing her IVC filter a few months ago as she was having work-up for pulmonary nodules.  She has been evaluated and does not require any surgery or procedures for this.  She is interested in coming off of anticoagulation at some point.  Her legs are actually doing quite well.  She gets some occasional left groin and thigh pain this is usually brief and limited.  No significant swelling.  No chest pain or shortness of breath.  Current Outpatient Medications  Medication Sig Dispense Refill  . acetaminophen (TYLENOL) 325 MG tablet Take 650 mg by mouth every 6 (six) hours as needed.    Marland Kitchen amoxicillin (AMOXIL) 500 MG capsule Take 500 mg by mouth 3 (three) times daily.    . B COMPLEX-C PO Take 1 tablet by mouth daily.     . Calcium Carbonate-Vitamin D 600-200 MG-UNIT CAPS Take 2 capsules by mouth daily.     . Cetirizine HCl 10 MG CAPS Take 1 capsule by mouth daily.     . Cholecalciferol 1000 UNITS capsule Take 1,000 Units by mouth daily.     Marland Kitchen ELIQUIS 5 MG TABS tablet TAKE 1 TABLET BY MOUTH TWICE A DAY 180 tablet 0  . fluticasone (FLONASE) 50 MCG/ACT nasal spray Place into both nostrils.    . mometasone (ELOCON) 0.1 % lotion Apply topically.    . MULTIPLE VITAMIN PO Take 1 tablet by mouth daily.     . Omega-3 Fatty Acids (FISH OIL) 1200 MG CAPS Take 1 capsule by mouth daily.     . psyllium (REGULOID) 0.52 G capsule Take 1 capsule by mouth 3 (three) times daily.     . sertraline (ZOLOFT) 50 MG tablet TAKE 1 TABLET BY MOUTH ONCE A DAY 90 tablet 1  .  simvastatin (ZOCOR) 10 MG tablet TAKE ONE TABLET BY MOUTH AT BEDTIME 90 tablet 1   No current facility-administered medications for this visit.    Past Medical History:  Diagnosis Date  . Allergy   . Hyperlipidemia   . Renal mass     Past Surgical History:  Procedure Laterality Date  . ADRENALECTOMY    . CHOLECYSTECTOMY    . GANGLION CYST EXCISION Right 1996   wrist  . LAPAROSCOPIC NEPHRECTOMY    . MELANOMA EXCISION  08/2011  . PERIPHERAL VASCULAR THROMBECTOMY Left 10/14/2019   Procedure: LEFT LOWER EXTREMITY VENOUS LYSIS / THROMBECTOMY WITH IVF FILTER;  Surgeon: Algernon Huxley, MD;  Location: [facility];  Service: Cardiovascular;  Laterality: Left;  . SPINE SURGERY       Social History   Tobacco Use  . Smoking status: Former Smoker    Quit date: 11/10/1968    Years since quitting: 51.8  . Smokeless tobacco: Never Used  Vaping Use  . Vaping Use: Never used  Substance Use Topics  . Alcohol use: No  . Drug use: No      Family History  Problem Relation Age of Onset  .  Breast cancer Mother 2  . Prostate cancer Father   . Diabetes Maternal Aunt   . Diabetes Maternal Uncle      Allergies  Allergen Reactions  . Cefuroxime Diarrhea     REVIEW OF SYSTEMS (Negative unless checked)  Constitutional: [] Weight loss  [] Fever  [] Chills Cardiac: [] Chest pain   [] Chest pressure   [] Palpitations   [] Shortness of breath when laying flat   [] Shortness of breath at rest   [] Shortness of breath with exertion. Vascular:  [] Pain in legs with walking   [] Pain in legs at rest   [] Pain in legs when laying flat   [] Claudication   [] Pain in feet when walking  [] Pain in feet at rest  [] Pain in feet when laying flat   [x] History of DVT   [x] Phlebitis   [] Swelling in legs   [] Varicose veins   [] Non-healing ulcers Pulmonary:   [] Uses home oxygen   [] Productive cough   [] Hemoptysis   [] Wheeze  [] COPD   [] Asthma Neurologic:  [] Dizziness  [] Blackouts   [] Seizures   [] History of stroke    [] History of TIA  [] Aphasia   [] Temporary blindness   [] Dysphagia   [] Weakness or numbness in arms   [] Weakness or numbness in legs Musculoskeletal:  [x] Arthritis   [] Joint swelling   [] Joint pain   [] Low back pain Hematologic:  [] Easy bruising  [] Easy bleeding   [] Hypercoagulable state   [] Anemic   Gastrointestinal:  [] Blood in stool   [] Vomiting blood  [] Gastroesophageal reflux/heartburn   [] Abdominal pain Genitourinary:  [] Chronic kidney disease   [] Difficult urination  [] Frequent urination  [] Burning with urination   [] Hematuria Skin:  [] Rashes   [] Ulcers   [] Wounds Psychological:  [] History of anxiety   []  History of major depression.  Physical Examination  BP 139/76   Pulse 67   Ht 5\' 5"  (1.651 m)   Wt 132 lb (59.9 kg)   BMI 21.97 kg/m  Gen:  WD/WN, NAD. Appears younger than stated age. Head: [place]/AT, No temporalis wasting. Ear/Nose/Throat: Hearing grossly intact, nares w/o erythema or drainage Eyes: Conjunctiva clear. Sclera non-icteric Neck: Supple.  Trachea midline Pulmonary:  Good air movement, no use of accessory muscles.  Cardiac: RRR, no JVD Vascular:  Vessel Right Left  Radial Palpable Palpable                          PT Palpable Palpable  DP Palpable Palpable   Gastrointestinal: soft, non-tender/non-distended. No guarding/reflex.  Musculoskeletal: M/S 5/5 throughout.  No deformity or atrophy. No edema. Neurologic: Sensation grossly intact in extremities.  Symmetrical.  Speech is fluent.  Psychiatric: Judgment intact, Mood & affect appropriate for pt's clinical situation. Dermatologic: No rashes or ulcers noted.  No cellulitis or open wounds.       Labs No results found for this or any previous visit (from the past 2160 hour(s)).  Radiology No results found.  Assessment/Plan  DVT (deep venous thrombosis) (HCC) Has been on Eliquis and should probably stay on this with her filter still in place and her concerns for malignancy making her high risk for  recurrent thrombosis without anticoagulation.  Hypercholesteremia lipid control important in reducing the progression of atherosclerotic disease. Continue statin therapy   Presence of IVC filter About 9 months ago this was placed.  After being worked up for pulmonary nodules, no surgery is forthcoming.  She remains on anticoagulation so the risk of thrombosis or issues is low, she does hope to get off  of anticoagulation.  As such, I think it is reasonable to now consider IVC filter removal.  Risks and benefits of the procedure were discussed and the patient is agreeable to proceed.    Leotis Pain, MD  08/22/2020 11:27 AM    This note was created with Dragon medical transcription system.  Any errors from dictation are purely unintentional

## 2020-08-22 NOTE — Assessment & Plan Note (Signed)
lipid control important in reducing the progression of atherosclerotic disease. Continue statin therapy  

## 2020-08-22 NOTE — Assessment & Plan Note (Signed)
About 9 months ago this was placed.  After being worked up for pulmonary nodules, no surgery is forthcoming.  She remains on anticoagulation so the risk of thrombosis or issues is low, she does hope to get off of anticoagulation.  As such, I think it is reasonable to now consider IVC filter removal.  Risks and benefits of the procedure were discussed and the patient is agreeable to proceed.

## 2020-08-22 NOTE — Assessment & Plan Note (Signed)
Has been on Eliquis and should probably stay on this with her filter still in place and her concerns for malignancy making her high risk for recurrent thrombosis without anticoagulation.

## 2020-08-22 NOTE — H&P (View-Only) (Signed)
MRN : [id number]  Rita Webb is a 75 y.o. (Nov 10, 1945) female who presents with chief complaint of  Chief Complaint  Patient presents with  . Follow-up    3 Mo no studies  .  History of Present Illness: Patient returns today in follow up of her DVT and previous IVC filter placement.  Her legs are doing well.  She remains on anticoagulation.  She had thrombectomy about 9 months ago and the filter was placed at that time.  We held off on removing her IVC filter a few months ago as she was having work-up for pulmonary nodules.  She has been evaluated and does not require any surgery or procedures for this.  She is interested in coming off of anticoagulation at some point.  Her legs are actually doing quite well.  She gets some occasional left groin and thigh pain this is usually brief and limited.  No significant swelling.  No chest pain or shortness of breath.  Current Outpatient Medications  Medication Sig Dispense Refill  . acetaminophen (TYLENOL) 325 MG tablet Take 650 mg by mouth every 6 (six) hours as needed.    Marland Kitchen amoxicillin (AMOXIL) 500 MG capsule Take 500 mg by mouth 3 (three) times daily.    . B COMPLEX-C PO Take 1 tablet by mouth daily.     . Calcium Carbonate-Vitamin D 600-200 MG-UNIT CAPS Take 2 capsules by mouth daily.     . Cetirizine HCl 10 MG CAPS Take 1 capsule by mouth daily.     . Cholecalciferol 1000 UNITS capsule Take 1,000 Units by mouth daily.     Marland Kitchen ELIQUIS 5 MG TABS tablet TAKE 1 TABLET BY MOUTH TWICE A DAY 180 tablet 0  . fluticasone (FLONASE) 50 MCG/ACT nasal spray Place into both nostrils.    . mometasone (ELOCON) 0.1 % lotion Apply topically.    . MULTIPLE VITAMIN PO Take 1 tablet by mouth daily.     . Omega-3 Fatty Acids (FISH OIL) 1200 MG CAPS Take 1 capsule by mouth daily.     . psyllium (REGULOID) 0.52 G capsule Take 1 capsule by mouth 3 (three) times daily.     . sertraline (ZOLOFT) 50 MG tablet TAKE 1 TABLET BY MOUTH ONCE A DAY 90 tablet 1  .  simvastatin (ZOCOR) 10 MG tablet TAKE ONE TABLET BY MOUTH AT BEDTIME 90 tablet 1   No current facility-administered medications for this visit.    Past Medical History:  Diagnosis Date  . Allergy   . Hyperlipidemia   . Renal mass     Past Surgical History:  Procedure Laterality Date  . ADRENALECTOMY    . CHOLECYSTECTOMY    . GANGLION CYST EXCISION Right 1996   wrist  . LAPAROSCOPIC NEPHRECTOMY    . MELANOMA EXCISION  08/2011  . PERIPHERAL VASCULAR THROMBECTOMY Left 10/14/2019   Procedure: LEFT LOWER EXTREMITY VENOUS LYSIS / THROMBECTOMY WITH IVF FILTER;  Surgeon: Algernon Huxley, MD;  Location: [facility];  Service: Cardiovascular;  Laterality: Left;  . SPINE SURGERY       Social History   Tobacco Use  . Smoking status: Former Smoker    Quit date: 11/10/1968    Years since quitting: 51.8  . Smokeless tobacco: Never Used  Vaping Use  . Vaping Use: Never used  Substance Use Topics  . Alcohol use: No  . Drug use: No      Family History  Problem Relation Age of Onset  .  Breast cancer Mother 66  . Prostate cancer Father   . Diabetes Maternal Aunt   . Diabetes Maternal Uncle      Allergies  Allergen Reactions  . Cefuroxime Diarrhea     REVIEW OF SYSTEMS (Negative unless checked)  Constitutional: [] Weight loss  [] Fever  [] Chills Cardiac: [] Chest pain   [] Chest pressure   [] Palpitations   [] Shortness of breath when laying flat   [] Shortness of breath at rest   [] Shortness of breath with exertion. Vascular:  [] Pain in legs with walking   [] Pain in legs at rest   [] Pain in legs when laying flat   [] Claudication   [] Pain in feet when walking  [] Pain in feet at rest  [] Pain in feet when laying flat   [x] History of DVT   [x] Phlebitis   [] Swelling in legs   [] Varicose veins   [] Non-healing ulcers Pulmonary:   [] Uses home oxygen   [] Productive cough   [] Hemoptysis   [] Wheeze  [] COPD   [] Asthma Neurologic:  [] Dizziness  [] Blackouts   [] Seizures   [] History of stroke    [] History of TIA  [] Aphasia   [] Temporary blindness   [] Dysphagia   [] Weakness or numbness in arms   [] Weakness or numbness in legs Musculoskeletal:  [x] Arthritis   [] Joint swelling   [] Joint pain   [] Low back pain Hematologic:  [] Easy bruising  [] Easy bleeding   [] Hypercoagulable state   [] Anemic   Gastrointestinal:  [] Blood in stool   [] Vomiting blood  [] Gastroesophageal reflux/heartburn   [] Abdominal pain Genitourinary:  [] Chronic kidney disease   [] Difficult urination  [] Frequent urination  [] Burning with urination   [] Hematuria Skin:  [] Rashes   [] Ulcers   [] Wounds Psychological:  [] History of anxiety   []  History of major depression.  Physical Examination  BP 139/76   Pulse 67   Ht 5\' 5"  (1.651 m)   Wt 132 lb (59.9 kg)   BMI 21.97 kg/m  Gen:  WD/WN, NAD. Appears younger than stated age. Head: [place]/AT, No temporalis wasting. Ear/Nose/Throat: Hearing grossly intact, nares w/o erythema or drainage Eyes: Conjunctiva clear. Sclera non-icteric Neck: Supple.  Trachea midline Pulmonary:  Good air movement, no use of accessory muscles.  Cardiac: RRR, no JVD Vascular:  Vessel Right Left  Radial Palpable Palpable                          PT Palpable Palpable  DP Palpable Palpable   Gastrointestinal: soft, non-tender/non-distended. No guarding/reflex.  Musculoskeletal: M/S 5/5 throughout.  No deformity or atrophy. No edema. Neurologic: Sensation grossly intact in extremities.  Symmetrical.  Speech is fluent.  Psychiatric: Judgment intact, Mood & affect appropriate for pt's clinical situation. Dermatologic: No rashes or ulcers noted.  No cellulitis or open wounds.       Labs No results found for this or any previous visit (from the past 2160 hour(s)).  Radiology No results found.  Assessment/Plan  DVT (deep venous thrombosis) (HCC) Has been on Eliquis and should probably stay on this with her filter still in place and her concerns for malignancy making her high risk for  recurrent thrombosis without anticoagulation.  Hypercholesteremia lipid control important in reducing the progression of atherosclerotic disease. Continue statin therapy   Presence of IVC filter About 9 months ago this was placed.  After being worked up for pulmonary nodules, no surgery is forthcoming.  She remains on anticoagulation so the risk of thrombosis or issues is low, she does hope to get off  of anticoagulation.  As such, I think it is reasonable to now consider IVC filter removal.  Risks and benefits of the procedure were discussed and the patient is agreeable to proceed.    Leotis Pain, MD  08/22/2020 11:27 AM    This note was created with Dragon medical transcription system.  Any errors from dictation are purely unintentional

## 2020-08-24 ENCOUNTER — Telehealth (INDEPENDENT_AMBULATORY_CARE_PROVIDER_SITE_OTHER): Payer: Self-pay

## 2020-08-24 NOTE — Telephone Encounter (Signed)
Spoke with the patient and she is scheduled with Dr. Lucky Cowboy for a IVC filter removal on 08/31/20 with a 10:00 am arrival time to the [facility]. Covid testing on 08/29/20 between 8-1 pm at the [facility]. Pre-procedure instructions were discussed and will be mailed.

## 2020-08-25 NOTE — Patient Instructions (Signed)
Preventive Care 73 Years and Older, Female Preventive care refers to lifestyle choices and visits with your health care provider that can promote health and wellness. This includes:  A yearly physical exam. This is also called an annual wellness visit.  Regular dental and eye exams.  Immunizations.  Screening for certain conditions.  Healthy lifestyle choices, such as: ? Eating a healthy diet. ? Getting regular exercise. ? Not using drugs or products that contain nicotine and tobacco. ? Limiting alcohol use. What can I expect for my preventive care visit? Physical exam Your health care provider will check your:  Height and weight. These may be used to calculate your BMI (body mass index). BMI is a measurement that tells if you are at a healthy weight.  Heart rate and blood pressure.  Body temperature.  Skin for abnormal spots. Counseling Your health care provider may ask you questions about your:  Past medical problems.  Family's medical history.  Alcohol, tobacco, and drug use.  Emotional well-being.  Home life and relationship well-being.  Sexual activity.  Diet, exercise, and sleep habits.  History of falls.  Memory and ability to understand (cognition).  Work and work Statistician.  Pregnancy and menstrual history.  Access to firearms. What immunizations do I need? Vaccines are usually given at various ages, according to a schedule. Your health care provider will recommend vaccines for you based on your age, medical history, and lifestyle or other factors, such as travel or where you work.   What tests do I need? Blood tests  Lipid and cholesterol levels. These may be checked every 5 years, or more often depending on your overall health.  Hepatitis C test.  Hepatitis B test. Screening  Lung cancer screening. You may have this screening every year starting at age 73 if you have a 30-pack-year history of smoking and currently smoke or have quit within  the past 15 years.  Colorectal cancer screening. ? All adults should have this screening starting at age 44 and continuing until age 58. ? Your health care provider may recommend screening at age 2 if you are at increased risk. ? You will have tests every 1-10 years, depending on your results and the type of screening test.  Diabetes screening. ? This is done by checking your blood sugar (glucose) after you have not eaten for a while (fasting). ? You may have this done every 1-3 years.  Mammogram. ? This may be done every 1-2 years. ? Talk with your health care provider about how often you should have regular mammograms.  Abdominal aortic aneurysm (AAA) screening. You may need this if you are a current or former smoker.  BRCA-related cancer screening. This may be done if you have a family history of breast, ovarian, tubal, or peritoneal cancers. Other tests  STD (sexually transmitted disease) testing, if you are at risk.  Bone density scan. This is done to screen for osteoporosis. You may have this done starting at age 73. Talk with your health care provider about your test results, treatment options, and if necessary, the need for more tests. Follow these instructions at home: Eating and drinking  Eat a diet that includes fresh fruits and vegetables, whole grains, lean protein, and low-fat dairy products. Limit your intake of foods with high amounts of sugar, saturated fats, and salt.  Take vitamin and mineral supplements as recommended by your health care provider.  Do not drink alcohol if your health care provider tells you not to drink.  If you drink alcohol: ? Limit how much you have to 0-1 drink a day. ? Be aware of how much alcohol is in your drink. In the [place] one drink equals one 12 oz bottle of beer (355 mL), one 5 oz glass of wine (148 mL), or one 1 oz glass of hard liquor (44 mL).   Lifestyle  Take daily care of your teeth and gums. Brush your teeth every morning  and night with fluoride toothpaste. Floss one time each day.  Stay active. Exercise for at least 30 minutes 5 or more days each week.  Do not use any products that contain nicotine or tobacco, such as cigarettes, e-cigarettes, and chewing tobacco. If you need help quitting, ask your health care provider.  Do not use drugs.  If you are sexually active, practice safe sex. Use a condom or other form of protection in order to prevent STIs (sexually transmitted infections).  Talk with your health care provider about taking a low-dose aspirin or statin.  Find healthy ways to cope with stress, such as: ? Meditation, yoga, or listening to music. ? Journaling. ? Talking to a trusted person. ? Spending time with friends and family. Safety  Always wear your seat belt while driving or riding in a vehicle.  Do not drive: ? If you have been drinking alcohol. Do not ride with someone who has been drinking. ? When you are tired or distracted. ? While texting.  Wear a helmet and other protective equipment during sports activities.  If you have firearms in your house, make sure you follow all gun safety procedures. What's next?  Visit your health care provider once a year for an annual wellness visit.  Ask your health care provider how often you should have your eyes and teeth checked.  Stay up to date on all vaccines. This information is not intended to replace advice given to you by your health care provider. Make sure you discuss any questions you have with your health care provider. Document Revised: 06/14/2020 Document Reviewed: 06/18/2018 Elsevier Patient Education  2021 [employer]  

## 2020-08-25 NOTE — Progress Notes (Signed)
Complete physical exam   Patient: Rita Webb   DOB: [birth year]   75 y.o. Female  MRN: [id number] Visit Date: 08/28/2020  Today's healthcare provider: Lavon Paganini, MD   Chief Complaint  Patient presents with  . Annual Exam   Subjective    Rita Webb is a 75 y.o. female who presents today for a complete physical exam.  She reports consuming a general diet. Home exercise routine includes walking 1 hrs per day. She generally feels well. She reports sleeping well. She does not have additional problems to discuss today.  HPI  02/18/2020 Mammogram-BI-RADS 1 03/17/2019 BMD-Osteoporosis Last colonoscopy in 2013 - normal repeat in 10 yrs  Past Medical History:  Diagnosis Date  . Allergy   . Hyperlipidemia   . Renal mass    Past Surgical History:  Procedure Laterality Date  . ADRENALECTOMY    . CHOLECYSTECTOMY    . EYE SURGERY Left 05/2020  . GANGLION CYST EXCISION Right 1996   wrist  . LAPAROSCOPIC NEPHRECTOMY    . MELANOMA EXCISION  08/2011  . PERIPHERAL VASCULAR THROMBECTOMY Left 10/14/2019   Procedure: LEFT LOWER EXTREMITY VENOUS LYSIS / THROMBECTOMY WITH IVF FILTER;  Surgeon: Algernon Huxley, MD;  Location: [facility];  Service: Cardiovascular;  Laterality: Left;  . SPINE SURGERY     Social History   Socioeconomic History  . Marital status: Married    Spouse name: Not on file  . Number of children: 2  . Years of education: Not on file  . Highest education level: Some college, no degree  Occupational History  . Occupation: retired  Tobacco Use  . Smoking status: Former Smoker    Quit date: 11/10/1968    Years since quitting: 51.8  . Smokeless tobacco: Never Used  Vaping Use  . Vaping Use: Never used  Substance and Sexual Activity  . Alcohol use: No  . Drug use: No  . Sexual activity: Not on file  Other Topics Concern  . Not on file  Social History Narrative  . Not on file   Social Determinants of Health   Financial  Resource Strain: Low Risk   . Difficulty of Paying Living Expenses: Not hard at all  Food Insecurity: No Food Insecurity  . Worried About Charity fundraiser in the Last Year: Never true  . Ran Out of Food in the Last Year: Never true  Transportation Needs: No Transportation Needs  . Lack of Transportation (Medical): No  . Lack of Transportation (Non-Medical): No  Physical Activity: Insufficiently Active  . Days of Exercise per Week: 5 days  . Minutes of Exercise per Session: 10 min  Stress: No Stress Concern Present  . Feeling of Stress : Only a little  Social Connections: Moderately Integrated  . Frequency of Communication with Friends and Family: More than three times a week  . Frequency of Social Gatherings with Friends and Family: More than three times a week  . Attends Religious Services: More than 4 times per year  . Active Member of Clubs or Organizations: No  . Attends Archivist Meetings: Never  . Marital Status: Married  Human resources officer Violence: Not At Risk  . Fear of Current or Ex-Partner: No  . Emotionally Abused: No  . Physically Abused: No  . Sexually Abused: No   Family Status  Relation Name Status  . Mother  Deceased at age 69  . Father  Deceased  . Brother  Alive  .  Mat Aunt  (Not Specified)  . Mat Uncle  (Not Specified)  . Sister half Alive  . Daughter  Alive  . Brother half Alive  . Daughter  Alive   Family History  Problem Relation Age of Onset  . Breast cancer Mother 68  . Prostate cancer Father   . Diabetes Maternal Aunt   . Diabetes Maternal Uncle    Allergies  Allergen Reactions  . Cefuroxime Diarrhea    Patient Care Team: Virginia Crews, MD as PCP - General (Family Medicine) Thelma Comp, [place] (Optometry) Vevelyn Royals, MD as Consulting Physician (Ophthalmology) Dionne Milo, Rockney Ghee, MD as Referring Physician (Hematology and Oncology) Timoteo Gaul, MD as Referring Physician (Urology) Lucky Cowboy Erskine Squibb, MD  as Referring Physician (Vascular Surgery) Beverly Gust, MD (Otolaryngology) Dasher, Rayvon Char, MD (Dermatology)   Medications: Outpatient Medications Prior to Visit  Medication Sig  . acetaminophen (TYLENOL) 325 MG tablet Take 650 mg by mouth every 6 (six) hours as needed.  Marland Kitchen amoxicillin (AMOXIL) 500 MG capsule Take 500 mg by mouth 3 (three) times daily.  . B COMPLEX-C PO Take 1 tablet by mouth daily.   . Calcium Carbonate-Vitamin D 600-200 MG-UNIT CAPS Take 2 capsules by mouth daily.   . Cetirizine HCl 10 MG CAPS Take 1 capsule by mouth daily.   . Cholecalciferol 1000 UNITS capsule Take 1,000 Units by mouth daily.   Marland Kitchen ELIQUIS 5 MG TABS tablet TAKE 1 TABLET BY MOUTH TWICE A DAY  . fluticasone (FLONASE) 50 MCG/ACT nasal spray Place into both nostrils.  . mometasone (ELOCON) 0.1 % lotion Apply topically.  . MULTIPLE VITAMIN PO Take 1 tablet by mouth daily.   . Omega-3 Fatty Acids (FISH OIL) 1200 MG CAPS Take 1 capsule by mouth daily.   . psyllium (REGULOID) 0.52 G capsule Take 1 capsule by mouth 3 (three) times daily.   . sertraline (ZOLOFT) 50 MG tablet TAKE 1 TABLET BY MOUTH ONCE A DAY  . simvastatin (ZOCOR) 10 MG tablet TAKE ONE TABLET BY MOUTH AT BEDTIME   No facility-administered medications prior to visit.    Review of Systems  Constitutional: Negative.   HENT: Positive for postnasal drip, rhinorrhea, sinus pressure and sneezing.   Eyes: Positive for visual disturbance.  Respiratory: Positive for cough.   Cardiovascular: Negative.   Gastrointestinal: Negative.   Endocrine: Negative.   Genitourinary: Negative.   Musculoskeletal: Positive for back pain.  Skin: Negative.   Allergic/Immunologic: Positive for environmental allergies.  Neurological: Negative.   Hematological: Bruises/bleeds easily.  Psychiatric/Behavioral: The patient is nervous/anxious.     Last CBC Lab Results  Component Value Date   WBC 8.4 10/25/2019   HGB 9.2 (L) 10/25/2019   HCT 29.0 (L)  10/25/2019   MCV 93 10/25/2019   MCH 29.4 10/25/2019   RDW 13.2 10/25/2019   PLT 729 (H) 40/98/1191   Last metabolic panel Lab Results  Component Value Date   GLUCOSE 82 10/25/2019   NA 141 10/25/2019   K 4.8 10/25/2019   CL 100 10/25/2019   CO2 24 10/25/2019   BUN 14 10/25/2019   CREATININE 0.66 10/25/2019   GFRNONAA 89 10/25/2019   GFRAA 102 10/25/2019   CALCIUM 9.0 10/25/2019   PROT 6.4 (L) 10/13/2019   ALBUMIN 2.8 (L) 10/13/2019   LABGLOB 2.0 02/18/2019   AGRATIO 2.3 (H) 02/18/2019   BILITOT 0.6 10/13/2019   ALKPHOS 84 10/13/2019   AST 25 10/13/2019   ALT 40 10/13/2019   ANIONGAP 10 10/15/2019  Last lipids Lab Results  Component Value Date   CHOL 169 02/18/2019   HDL 55 02/18/2019   LDLCALC 96 02/18/2019   TRIG 91 02/18/2019   CHOLHDL 3.1 02/18/2019   Last hemoglobin A1c Lab Results  Component Value Date   HGBA1C 5.8 (H) 02/18/2019   Last thyroid functions Lab Results  Component Value Date   TSH 1.99 01/10/2014   Last vitamin D Lab Results  Component Value Date   VD25OH 41.4 02/16/2018      Objective    BP (!) 117/57 (BP Location: Left Arm, Patient Position: Sitting, Cuff Size: Normal)   Pulse 62   Temp 98.2 F (36.8 C) (Oral)   Resp 16   Ht 5\' 5"  (1.651 m)   Wt 131 lb 3.2 oz (59.5 kg)   BMI 21.83 kg/m  BP Readings from Last 3 Encounters:  08/28/20 (!) 117/57  08/22/20 139/76  05/08/20 124/71   Wt Readings from Last 3 Encounters:  08/28/20 131 lb 3.2 oz (59.5 kg)  08/22/20 132 lb (59.9 kg)  05/08/20 124 lb (56.2 kg)      Physical Exam Vitals reviewed.  Constitutional:      General: She is not in acute distress.    Appearance: Normal appearance. She is well-developed. She is not diaphoretic.  HENT:     Head: Normocephalic and atraumatic.     Right Ear: Tympanic membrane, ear canal and external ear normal.     Left Ear: Tympanic membrane, ear canal and external ear normal.  Eyes:     General: No scleral icterus.     Conjunctiva/sclera: Conjunctivae normal.     Pupils: Pupils are equal, round, and reactive to light.  Neck:     Thyroid: No thyromegaly.  Cardiovascular:     Rate and Rhythm: Normal rate and regular rhythm.     Pulses: Normal pulses.     Heart sounds: Normal heart sounds. No murmur heard.   Pulmonary:     Effort: Pulmonary effort is normal. No respiratory distress.     Breath sounds: Normal breath sounds. No wheezing or rales.  Abdominal:     General: There is no distension.     Palpations: Abdomen is soft.     Tenderness: There is no abdominal tenderness.  Musculoskeletal:        General: No deformity.     Cervical back: Neck supple.     Right lower leg: No edema.     Left lower leg: No edema.  Lymphadenopathy:     Cervical: No cervical adenopathy.  Skin:    General: Skin is warm and dry.     Findings: No rash.  Neurological:     Mental Status: She is alert and oriented to person, place, and time. Mental status is at baseline.     Gait: Gait normal.  Psychiatric:        Mood and Affect: Mood normal.        Behavior: Behavior normal.        Thought Content: Thought content normal.       Last depression screening scores PHQ 2/9 Scores 08/28/2020 03/17/2020 02/22/2020  PHQ - 2 Score 0 0 0  PHQ- 9 Score 0 1 -   Last fall risk screening Fall Risk  08/28/2020  Falls in the past year? 0  Number falls in past yr: 0  Injury with Fall? 0  Risk for fall due to : No Fall Risks  Follow up Falls evaluation completed   Last  Audit-C alcohol use screening Alcohol Use Disorder Test (AUDIT) 08/28/2020  1. How often do you have a drink containing alcohol? 0  2. How many drinks containing alcohol do you have on a typical day when you are drinking? 0  3. How often do you have six or more drinks on one occasion? 0  AUDIT-C Score 0  Alcohol Brief Interventions/Follow-up AUDIT Score <7 follow-up not indicated   A score of 3 or more in women, and 4 or more in men indicates increased risk  for alcohol abuse, EXCEPT if all of the points are from question 1   No results found for any visits on 08/28/20.  Assessment & Plan    Routine Health Maintenance and Physical Exam  Exercise Activities and Dietary recommendations Goals    . Increase physical activity       Immunization History  Administered Date(s) Administered  . Influenza Split 07/21/2012  . Influenza, High Dose Seasonal PF 04/14/2014, 05/11/2015, 05/04/2016, 05/08/2017, 04/25/2018  . Influenza,inj,Quad PF,6+ Mos 04/15/2013  . Influenza-Unspecified 04/19/2020  . PFIZER(Purple Top)SARS-COV-2 Vaccination 12/14/2019, 01/06/2020, 07/14/2020  . Pneumococcal Conjugate-13 12/29/2013  . Pneumococcal Polysaccharide-23 12/28/2012  . Tdap 11/27/2011  . Zoster 01/23/2011  . Zoster Recombinat (Shingrix) 03/05/2018, 06/11/2018    Health Maintenance  Topic Date Due  . COVID-19 Vaccine (4 - Booster for Pfizer series) 01/11/2021  . DEXA SCAN  03/16/2021  . TETANUS/TDAP  11/26/2021  . COLONOSCOPY (Pts 45-16yrs Insurance coverage will need to be confirmed)  01/20/2022  . MAMMOGRAM  02/17/2022  . INFLUENZA VACCINE  Completed  . Hepatitis C Screening  Completed  . PNA vac Low Risk Adult  Completed    Discussed health benefits of physical activity, and encouraged her to engage in regular exercise appropriate for her age and condition.  Problem List Items Addressed This Visit      Musculoskeletal and Integument   Osteoporosis    Recheck Vit D level Recheck DEXA in 6 months (can be ordered at [facility])      Relevant Orders   VITAMIN D 25 Hydroxy (Vit-D Deficiency, Fractures)     Genitourinary   Renal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, right (HCC)    Followed by [facility] with upcoming repeat CTs        Other   Hypercholesteremia    Recheck CMP and FLP      Relevant Orders   Comprehensive metabolic panel   Lipid Panel With LDL/HDL Ratio   Avitaminosis D    Recheck level      Relevant Orders   VITAMIN D 25 Hydroxy  (Vit-D Deficiency, Fractures)   Adjustment disorder with anxiety    Stable, but some exacerbation in setting of upcoming surgery Ok to take Klonopin prn for situational stress Continue sertraline      Prediabetes   Relevant Orders   Hemoglobin A1c    Other Visit Diagnoses    Encounter for annual physical exam    -  Primary   Relevant Orders   Comprehensive metabolic panel   Lipid Panel With LDL/HDL Ratio   VITAMIN D 25 Hydroxy (Vit-D Deficiency, Fractures)   Hemoglobin A1c   CBC   Anemia, unspecified type       Relevant Orders   CBC   Chronic anticoagulation       Relevant Orders   CBC       Return in about 1 year (around 08/28/2021) for CPE.     I, Lavon Paganini, MD, have reviewed all documentation for this visit. The  documentation on 08/28/20 for the exam, diagnosis, procedures, and orders are all accurate and complete.   Sarissa Dern, Dionne Bucy, MD, MPH [facility]

## 2020-08-28 ENCOUNTER — Ambulatory Visit (INDEPENDENT_AMBULATORY_CARE_PROVIDER_SITE_OTHER): Payer: Medicare Other | Admitting: Family Medicine

## 2020-08-28 ENCOUNTER — Encounter: Payer: Self-pay | Admitting: Family Medicine

## 2020-08-28 ENCOUNTER — Other Ambulatory Visit: Payer: Self-pay

## 2020-08-28 VITALS — BP 117/57 | HR 62 | Temp 98.2°F | Resp 16 | Ht 65.0 in | Wt 131.2 lb

## 2020-08-28 DIAGNOSIS — Z0000 Encounter for general adult medical examination without abnormal findings: Secondary | ICD-10-CM | POA: Diagnosis not present

## 2020-08-28 DIAGNOSIS — C641 Malignant neoplasm of right kidney, except renal pelvis: Secondary | ICD-10-CM

## 2020-08-28 DIAGNOSIS — D649 Anemia, unspecified: Secondary | ICD-10-CM

## 2020-08-28 DIAGNOSIS — E559 Vitamin D deficiency, unspecified: Secondary | ICD-10-CM

## 2020-08-28 DIAGNOSIS — F4322 Adjustment disorder with anxiety: Secondary | ICD-10-CM

## 2020-08-28 DIAGNOSIS — R7303 Prediabetes: Secondary | ICD-10-CM | POA: Insufficient documentation

## 2020-08-28 DIAGNOSIS — M810 Age-related osteoporosis without current pathological fracture: Secondary | ICD-10-CM

## 2020-08-28 DIAGNOSIS — E7800 Pure hypercholesterolemia, unspecified: Secondary | ICD-10-CM | POA: Diagnosis not present

## 2020-08-28 DIAGNOSIS — Z7901 Long term (current) use of anticoagulants: Secondary | ICD-10-CM

## 2020-08-28 DIAGNOSIS — J301 Allergic rhinitis due to pollen: Secondary | ICD-10-CM | POA: Diagnosis not present

## 2020-08-28 NOTE — Assessment & Plan Note (Signed)
Recheck CMP and FLP

## 2020-08-28 NOTE — Assessment & Plan Note (Signed)
Recheck Vit D level Recheck DEXA in 6 months (can be ordered at [facility])

## 2020-08-28 NOTE — Assessment & Plan Note (Signed)
Followed by [facility] with upcoming repeat CTs

## 2020-08-28 NOTE — Assessment & Plan Note (Signed)
Stable, but some exacerbation in setting of upcoming surgery Ok to take Klonopin prn for situational stress Continue sertraline

## 2020-08-28 NOTE — Assessment & Plan Note (Signed)
Recheck level 

## 2020-08-29 ENCOUNTER — Other Ambulatory Visit
Admission: RE | Admit: 2020-08-29 | Discharge: 2020-08-29 | Disposition: A | Discharge status: Home / Self Care | Payer: Medicare Other | Source: Ambulatory Visit | Attending: Vascular Surgery | Admitting: Vascular Surgery

## 2020-08-29 DIAGNOSIS — Z01812 Encounter for preprocedural laboratory examination: Secondary | ICD-10-CM | POA: Insufficient documentation

## 2020-08-29 DIAGNOSIS — Z20822 Contact with and (suspected) exposure to covid-19: Secondary | ICD-10-CM | POA: Diagnosis not present

## 2020-08-29 LAB — CBC
Hematocrit: 40.4 % (ref 34.0–46.6)
Hemoglobin: 13.4 g/dL (ref 11.1–15.9)
MCH: 30.7 pg (ref 26.6–33.0)
MCHC: 33.2 g/dL (ref 31.5–35.7)
MCV: 93 fL (ref 79–97)
Platelets: 289 10*3/uL (ref 150–450)
RBC: 4.36 x10E6/uL (ref 3.77–5.28)
RDW: 12.5 % (ref 11.7–15.4)
WBC: 6 10*3/uL (ref 3.4–10.8)

## 2020-08-29 LAB — COMPREHENSIVE METABOLIC PANEL
ALT: 14 IU/L (ref 0–32)
AST: 21 IU/L (ref 0–40)
Albumin/Globulin Ratio: 2 (ref 1.2–2.2)
Albumin: 4.7 g/dL (ref 3.7–4.7)
Alkaline Phosphatase: 85 IU/L (ref 44–121)
BUN/Creatinine Ratio: 14 (ref 12–28)
BUN: 14 mg/dL (ref 8–27)
Bilirubin Total: 0.3 mg/dL (ref 0.0–1.2)
CO2: 23 mmol/L (ref 20–29)
Calcium: 9.1 mg/dL (ref 8.7–10.3)
Chloride: 104 mmol/L (ref 96–106)
Creatinine, Ser: 0.97 mg/dL (ref 0.57–1.00)
GFR calc Af Amer: 67 mL/min/{1.73_m2} (ref >59)
GFR calc non Af Amer: 59 mL/min/{1.73_m2} — ABNORMAL LOW (ref >59)
Globulin, Total: 2.4 g/dL (ref 1.5–4.5)
Glucose: 99 mg/dL (ref 65–99)
Potassium: 4.7 mmol/L (ref 3.5–5.2)
Sodium: 143 mmol/L (ref 134–144)
Total Protein: 7.1 g/dL (ref 6.0–8.5)

## 2020-08-29 LAB — LIPID PANEL WITH LDL/HDL RATIO
Cholesterol, Total: 171 mg/dL (ref 100–199)
HDL: 63 mg/dL (ref >39)
LDL Chol Calc (NIH): 95 mg/dL (ref 0–99)
LDL/HDL Ratio: 1.5 ratio (ref 0.0–3.2)
Triglycerides: 69 mg/dL (ref 0–149)
VLDL Cholesterol Cal: 13 mg/dL (ref 5–40)

## 2020-08-29 LAB — HEMOGLOBIN A1C
Est. average glucose Bld gHb Est-mCnc: 117 mg/dL
Hgb A1c MFr Bld: 5.7 % — ABNORMAL HIGH (ref 4.8–5.6)

## 2020-08-29 LAB — SARS CORONAVIRUS 2 (TAT 6-24 HRS): SARS Coronavirus 2: NEGATIVE

## 2020-08-29 LAB — VITAMIN D 25 HYDROXY (VIT D DEFICIENCY, FRACTURES): Vit D, 25-Hydroxy: 63.4 ng/mL (ref 30.0–100.0)

## 2020-08-30 ENCOUNTER — Other Ambulatory Visit (INDEPENDENT_AMBULATORY_CARE_PROVIDER_SITE_OTHER): Payer: Self-pay | Admitting: Nurse Practitioner

## 2020-08-31 ENCOUNTER — Encounter: Payer: Self-pay | Admitting: Vascular Surgery

## 2020-08-31 ENCOUNTER — Ambulatory Visit
Admission: RE | Admit: 2020-08-31 | Discharge: 2020-08-31 | Disposition: A | Discharge status: Home / Self Care | Payer: Medicare Other | Attending: Vascular Surgery | Admitting: Vascular Surgery

## 2020-08-31 ENCOUNTER — Encounter
Admission: RE | Disposition: A | Discharge status: Home / Self Care | Payer: Self-pay | Source: Home / Self Care | Attending: Vascular Surgery

## 2020-08-31 ENCOUNTER — Other Ambulatory Visit: Payer: Self-pay

## 2020-08-31 DIAGNOSIS — Z87891 Personal history of nicotine dependence: Secondary | ICD-10-CM | POA: Diagnosis not present

## 2020-08-31 DIAGNOSIS — Z79899 Other long term (current) drug therapy: Secondary | ICD-10-CM | POA: Insufficient documentation

## 2020-08-31 DIAGNOSIS — Z86718 Personal history of other venous thrombosis and embolism: Secondary | ICD-10-CM | POA: Insufficient documentation

## 2020-08-31 DIAGNOSIS — Z4589 Encounter for adjustment and management of other implanted devices: Secondary | ICD-10-CM | POA: Insufficient documentation

## 2020-08-31 DIAGNOSIS — I82409 Acute embolism and thrombosis of unspecified deep veins of unspecified lower extremity: Secondary | ICD-10-CM

## 2020-08-31 DIAGNOSIS — E7800 Pure hypercholesterolemia, unspecified: Secondary | ICD-10-CM | POA: Insufficient documentation

## 2020-08-31 DIAGNOSIS — Z8616 Personal history of COVID-19: Secondary | ICD-10-CM

## 2020-08-31 DIAGNOSIS — Z7901 Long term (current) use of anticoagulants: Secondary | ICD-10-CM | POA: Diagnosis not present

## 2020-08-31 HISTORY — PX: IVC FILTER REMOVAL: CATH118246

## 2020-08-31 HISTORY — DX: Acute embolism and thrombosis of unspecified deep veins of left lower extremity: I82.402

## 2020-08-31 SURGERY — IVC FILTER REMOVAL
Anesthesia: Moderate Sedation

## 2020-08-31 MED ORDER — FAMOTIDINE 20 MG PO TABS: 40.0000 mg | ORAL_TABLET | Freq: Once | ORAL | Status: DC | PRN

## 2020-08-31 MED ORDER — MIDAZOLAM HCL 2 MG/ML PO SYRP: 8.0000 mg | ORAL_SOLUTION | Freq: Once | ORAL | Status: DC | PRN

## 2020-08-31 MED ORDER — CLINDAMYCIN PHOSPHATE 300 MG/50ML IV SOLN
INTRAVENOUS | Status: AC
  Administered 2020-08-31: 300 mg
  Filled 2020-08-31: qty 50

## 2020-08-31 MED ORDER — DIPHENHYDRAMINE HCL 50 MG/ML IJ SOLN: 50.0000 mg | Freq: Once | INTRAMUSCULAR | Status: DC | PRN

## 2020-08-31 MED ORDER — HYDROMORPHONE HCL 1 MG/ML IJ SOLN
1.0000 mg | Freq: Once | INTRAMUSCULAR | Status: DC | PRN
Start: 2020-08-31 — End: 2020-08-31

## 2020-08-31 MED ORDER — ONDANSETRON HCL 4 MG/2ML IJ SOLN: 4.0000 mg | Freq: Four times a day (QID) | INTRAMUSCULAR | Status: DC | PRN

## 2020-08-31 MED ORDER — MIDAZOLAM HCL 5 MG/5ML IJ SOLN
INTRAMUSCULAR | Status: AC
  Administered 2020-08-31: 2 mg
  Filled 2020-08-31: qty 5

## 2020-08-31 MED ORDER — IOHEXOL 300 MG/ML  SOLN
INTRAMUSCULAR | Status: DC | PRN
  Administered 2020-08-31: 15 mL

## 2020-08-31 MED ORDER — METHYLPREDNISOLONE SODIUM SUCC 125 MG IJ SOLR: 125.0000 mg | Freq: Once | INTRAMUSCULAR | Status: DC | PRN

## 2020-08-31 MED ORDER — CLINDAMYCIN PHOSPHATE 300 MG/50ML IV SOLN: 300.0000 mg | Freq: Once | INTRAVENOUS | Status: DC

## 2020-08-31 MED ORDER — SODIUM CHLORIDE 0.9 % IV SOLN: INTRAVENOUS | Status: DC

## 2020-08-31 MED ORDER — FENTANYL CITRATE (PF) 100 MCG/2ML IJ SOLN
INTRAMUSCULAR | Status: AC
  Administered 2020-08-31: 50 ug
  Filled 2020-08-31: qty 2

## 2020-08-31 SURGICAL SUPPLY — 3 items
ENSNARE 18-30 (MISCELLANEOUS) ×1 IMPLANT
PACK ANGIOGRAPHY (CUSTOM PROCEDURE TRAY) ×2 IMPLANT
SHEATH FLEXOR 9FRX30 (SHEATH) ×1 IMPLANT

## 2020-08-31 NOTE — Op Note (Signed)
 VEIN AND VASCULAR SURGERY   OPERATIVE NOTE    PRE-OPERATIVE DIAGNOSIS:  1. 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 Ultrasound guidance for vascular access right jugular vein 2. Catheter placement into inferior vena cava from right jugular vein 3. Inferior venacavogram 4. Retrieval of [place] IVC filter  SURGEON: Leotis Pain, MD  ASSISTANT(S): None  ANESTHESIA: Local with moderate conscious sedation for approximately 14 minutes using 2 mg of Versed and 50 mcg of Fentanyl  ESTIMATED BLOOD LOSS: 5 cc  CONTRAST:  15 cc  FLUORO TIME:  5.1 minutes  FINDING(S): 1. IVC removed without difficulty  SPECIMEN(S): IVC filter  INDICATIONS:  Patient is a 75 y.o. female who presents with a previous history of IVC filter placement. Patient has maintained anticoagulation and no longer needs this filter. The patient remains on anticoagulation. Risks and benefits were discussed, and informed consent was obtained.  DESCRIPTION: After obtaining full informed written consent, the patient was brought back to the vascular suite and placed supine upon the table.Moderate conscious sedation was administered during a face to face encounter with the patient throughout the procedure with my supervision of the RN administering medicines and monitoring the patient's vital signs, pulse oximetry, telemetry and mental status throughout from the start of the procedure until the patient was taken to the recovery room.  After obtaining adequate anesthesia, the patient was prepped and draped in the standard fashion. The right jugular vein was visualized with ultrasound and found to be widely patent. It was then accessed under direct ultrasound guidance without difficulty with the Seldinger needle and a permanent image was recorded. A J-wire was placed. After skin nick and dilatation, the retrieval sheath was placed over the wire and  advanced into the inferior vena cava. Inferior vena cava was imaged and found to be widely patent on inferior venacavogram. The filter was straight in its orientation. The retrieval snare was then placed through the sheath and the hook of the filter was snared without difficulty. The sheath was then advanced, and the filter was collapsed and brought into the sheath in its entirety. It was then removed from the body in its entirety. The retrieval sheath was then removed. Pressure was held at the access site and sterile dressing was placed. The patient was taken to the recovery room in stable condition having tolerated the procedure well.  COMPLICATIONS: None  CONDITION: Stable   Leotis Pain 08/31/2020 11:22 AM  This note was created with Dragon Medical transcription system. Any errors in dictation are purely unintentional.

## 2020-08-31 NOTE — Interval H&P Note (Signed)
History and Physical Interval Note:  08/31/2020 10:40 AM  [place]  has presented today for surgery, with the diagnosis of IVC filter removal   DVT Covid  Feb 22.  The various methods of treatment have been discussed with the patient and family. After consideration of risks, benefits and other options for treatment, the patient has consented to  Procedure(s): IVC FILTER REMOVAL (N/A) as a surgical intervention.  The patient's history has been reviewed, patient examined, no change in status, stable for surgery.  I have reviewed the patient's chart and labs.  Questions were answered to the patient's satisfaction.     Leotis Pain

## 2020-09-04 DIAGNOSIS — J301 Allergic rhinitis due to pollen: Secondary | ICD-10-CM | POA: Diagnosis not present

## 2020-09-06 ENCOUNTER — Encounter: Payer: Self-pay | Admitting: Vascular Surgery

## 2020-09-08 ENCOUNTER — Other Ambulatory Visit: Payer: Self-pay | Admitting: Family Medicine

## 2020-09-08 DIAGNOSIS — I82412 Acute embolism and thrombosis of left femoral vein: Secondary | ICD-10-CM

## 2020-09-11 DIAGNOSIS — J301 Allergic rhinitis due to pollen: Secondary | ICD-10-CM | POA: Diagnosis not present

## 2020-09-18 DIAGNOSIS — J301 Allergic rhinitis due to pollen: Secondary | ICD-10-CM | POA: Diagnosis not present

## 2020-09-19 DIAGNOSIS — H2511 Age-related nuclear cataract, right eye: Secondary | ICD-10-CM | POA: Diagnosis not present

## 2020-09-19 DIAGNOSIS — H2512 Age-related nuclear cataract, left eye: Secondary | ICD-10-CM | POA: Diagnosis not present

## 2020-09-20 DIAGNOSIS — J309 Allergic rhinitis, unspecified: Secondary | ICD-10-CM | POA: Diagnosis not present

## 2020-09-20 DIAGNOSIS — J300 Vasomotor rhinitis: Secondary | ICD-10-CM | POA: Diagnosis not present

## 2020-09-25 DIAGNOSIS — J301 Allergic rhinitis due to pollen: Secondary | ICD-10-CM | POA: Diagnosis not present

## 2020-09-26 ENCOUNTER — Ambulatory Visit (INDEPENDENT_AMBULATORY_CARE_PROVIDER_SITE_OTHER): Payer: Medicare Other | Admitting: Vascular Surgery

## 2020-09-26 ENCOUNTER — Encounter (INDEPENDENT_AMBULATORY_CARE_PROVIDER_SITE_OTHER): Payer: Self-pay | Admitting: Vascular Surgery

## 2020-09-26 ENCOUNTER — Other Ambulatory Visit: Payer: Self-pay

## 2020-09-26 VITALS — BP 138/71 | HR 69 | Resp 16 | Ht 65.5 in | Wt 130.2 lb

## 2020-09-26 DIAGNOSIS — E7800 Pure hypercholesterolemia, unspecified: Secondary | ICD-10-CM | POA: Diagnosis not present

## 2020-09-26 DIAGNOSIS — I82512 Chronic embolism and thrombosis of left femoral vein: Secondary | ICD-10-CM | POA: Diagnosis not present

## 2020-09-26 NOTE — Progress Notes (Signed)
MRN : [id number]  Rita Webb is a 75 y.o. (1946-05-01) female who presents with chief complaint of  Chief Complaint  Patient presents with  . Follow-up    [facility] 4wk post ivc filter removal  .  History of Present Illness: Patient returns today in follow up of her left leg DVT.  She recently underwent removal of her IVC filter.  She has done well.  She has been on anticoagulation for almost a year now.  She is tolerating this well.  She does have some chronic swelling and pain in the left leg.  She had no periprocedural complications after her filter removal  Current Outpatient Medications  Medication Sig Dispense Refill  . acetaminophen (TYLENOL) 325 MG tablet Take 650 mg by mouth every 6 (six) hours as needed.    . B COMPLEX-C PO Take 1 tablet by mouth daily.     . Calcium Carbonate-Vitamin D 600-200 MG-UNIT CAPS Take 2 capsules by mouth daily.     . Cetirizine HCl 10 MG CAPS Take 1 capsule by mouth daily.     . Cholecalciferol 1000 UNITS capsule Take 1,000 Units by mouth daily.     . clonazePAM (KLONOPIN) 0.5 MG tablet Take 0.5 mg by mouth 2 (two) times daily as needed for anxiety.    Marland Kitchen ELIQUIS 5 MG TABS tablet TAKE 1 TABLET BY MOUTH TWICE A DAY 180 tablet 2  . fluticasone (FLONASE) 50 MCG/ACT nasal spray Place into both nostrils.    . mometasone (ELOCON) 0.1 % lotion Apply topically.    . MULTIPLE VITAMIN PO Take 1 tablet by mouth daily.     . Omega-3 Fatty Acids (FISH OIL) 1200 MG CAPS Take 1 capsule by mouth daily.     . psyllium (REGULOID) 0.52 G capsule Take 1 capsule by mouth 3 (three) times daily.     . sertraline (ZOLOFT) 50 MG tablet TAKE 1 TABLET BY MOUTH ONCE A DAY 90 tablet 1  . simvastatin (ZOCOR) 10 MG tablet TAKE ONE TABLET BY MOUTH AT BEDTIME 90 tablet 1  . amoxicillin (AMOXIL) 500 MG capsule Take 500 mg by mouth 3 (three) times daily. (Patient not taking: Reported on 08/31/2020)     No current facility-administered medications for this visit.    Past  Medical History:  Diagnosis Date  . Allergy   . Deep vein blood clot of left lower extremity ([place])   . Hyperlipidemia   . Renal mass     Past Surgical History:  Procedure Laterality Date  . ADRENALECTOMY    . CHOLECYSTECTOMY    . EYE SURGERY Left 05/2020  . GANGLION CYST EXCISION Right 1996   wrist  . IVC FILTER REMOVAL N/A 08/31/2020   Procedure: IVC FILTER REMOVAL;  Surgeon: Algernon Huxley, MD;  Location: [facility];  Service: Cardiovascular;  Laterality: N/A;  . LAPAROSCOPIC NEPHRECTOMY    . MELANOMA EXCISION  08/2011  . PERIPHERAL VASCULAR THROMBECTOMY Left 10/14/2019   Procedure: LEFT LOWER EXTREMITY VENOUS LYSIS / THROMBECTOMY WITH IVF FILTER;  Surgeon: Algernon Huxley, MD;  Location: [facility];  Service: Cardiovascular;  Laterality: Left;  . SPINE SURGERY       Social History   Tobacco Use  . Smoking status: Former Smoker    Quit date: 11/10/1968    Years since quitting: 51.9  . Smokeless tobacco: Never Used  Vaping Use  . Vaping Use: Never used  Substance Use Topics  . Alcohol use: No  .  Drug use: No      Family History  Problem Relation Age of Onset  . Breast cancer Mother 68  . Prostate cancer Father   . Diabetes Maternal Aunt   . Diabetes Maternal Uncle      Allergies  Allergen Reactions  . Cefuroxime Diarrhea    REVIEW OF SYSTEMS (Negative unless checked)  Constitutional: _0 ?Weight loss  _1 ?Fever  _2 ?Chills Cardiac: _3 ?Chest pain   _4 ?Chest pressure   _5 ?Palpitations   _6 ?Shortness of breath when laying flat   _7 ?Shortness of breath at rest   _8 ?Shortness of breath with exertion. Vascular:  _9 ?Pain in legs with walking   _10 ?Pain in legs at rest   _11 ?Pain in legs when laying flat   _12 ?Claudication   _13 ?Pain in feet when walking  _14 ?Pain in feet at rest  _15 ?Pain in feet when laying flat   _16 ?History of DVT   _17 ?Phlebitis   _18 ?Swelling in legs   _19 ?Varicose veins   _20 ?Non-healing ulcers Pulmonary:   _21 ?Uses home oxygen   _22 ?Productive  cough   _23 ?Hemoptysis   _24 ?Wheeze  _25 ?COPD   _26 ?Asthma Neurologic:  _27 ?Dizziness  _28 ?Blackouts   _29 ?Seizures   _30 ?History of stroke   _31 ?History of TIA  _32 ?Aphasia   _33 ?Temporary blindness   _34 ?Dysphagia   _35 ?Weakness or numbness in arms   _36 ?Weakness or numbness in legs Musculoskeletal:  _37 ?Arthritis   _38 ?Joint swelling   _39 ?Joint pain   _40 ?Low back pain Hematologic:  _41 ?Easy bruising  _42 ?Easy bleeding   _43 ?Hypercoagulable state   _44 ?Anemic   Gastrointestinal:  _45 ?Blood in stool   _46 ?Vomiting blood  _47 ?Gastroesophageal reflux/heartburn   _48 ?Abdominal pain Genitourinary:  _49 ?Chronic kidney disease   _50 ?Difficult urination  _51 ?Frequent urination  _52 ?Burning with urination   _53 ?Hematuria Skin:  _54 ?Rashes   _55 ?Ulcers   _56 ?Wounds Psychological:  _57 ?History of anxiety   _58 ? History of major depression.  Physical Examination  BP 138/71 (BP Location: Right Arm)   Pulse 69   Resp 16   Ht 5' 5.5" (1.664 m)   Wt 130 lb 3.2 oz (59.1 kg)   BMI 21.34 kg/m  Gen:  WD/WN, NAD Head: [place]/AT, No temporalis wasting. Ear/Nose/Throat: Hearing grossly intact, nares w/o erythema or drainage Eyes: Conjunctiva clear. Sclera non-icteric Neck: Supple.  Trachea midline Pulmonary:  Good air movement, no use of accessory muscles.  Cardiac: RRR, no JVD Vascular:  Vessel Right Left  Radial Palpable Palpable                          PT Palpable Palpable  DP Palpable Palpable   Gastrointestinal: soft, non-tender/non-distended. No guarding/reflex.  Musculoskeletal: M/S 5/5 throughout.  No deformity or atrophy. Mild stasis changes and edema in the left leg. Neurologic: Sensation grossly intact in extremities.  Symmetrical.  Speech is fluent.  Psychiatric: Judgment intact, Mood & affect appropriate for pt's clinical situation. Dermatologic: No rashes or ulcers noted.  No cellulitis or open wounds.       Labs Recent Results (from the past 2160 hour(s))  Comprehensive metabolic panel     Status: Abnormal    Collection Time: 08/28/20  9:48 AM  Result Value Ref Range   Glucose 99 65 - 99 mg/dL   BUN 14 8 - 27 mg/dL   Creatinine, Ser 0.97 0.57 - 1.00 mg/dL    Comment:                **Effective September 04, 2020 [facility] will begin**  reporting the 2021 CKD-EPI creatinine equation that                  estimates kidney function without a race variable.    GFR calc non Af Amer 59 (L) >59 mL/min/1.73   GFR calc Af Amer 67 >59 mL/min/1.73    Comment: **In accordance with recommendations from the NKF-ASN Task force,**   [facility] is in the process of updating its eGFR calculation to the   2021 CKD-EPI creatinine equation that estimates kidney function   without a race variable.    BUN/Creatinine Ratio 14 12 - 28   Sodium 143 134 - 144 mmol/L   Potassium 4.7 3.5 - 5.2 mmol/L   Chloride 104 96 - 106 mmol/L   CO2 23 20 - 29 mmol/L   Calcium 9.1 8.7 - 10.3 mg/dL   Total Protein 7.1 6.0 - 8.5 g/dL   Albumin 4.7 3.7 - 4.7 g/dL   Globulin, Total 2.4 1.5 - 4.5 g/dL   Albumin/Globulin Ratio 2.0 1.2 - 2.2   Bilirubin Total 0.3 0.0 - 1.2 mg/dL   Alkaline Phosphatase 85 44 - 121 IU/L   AST 21 0 - 40 IU/L   ALT 14 0 - 32 IU/L  Lipid Panel With LDL/HDL Ratio     Status: None   Collection Time: 08/28/20  9:48 AM  Result Value Ref Range   Cholesterol, Total 171 100 - 199 mg/dL   Triglycerides 69 0 - 149 mg/dL   HDL 63 >39 mg/dL   VLDL Cholesterol Cal 13 5 - 40 mg/dL   LDL Chol Calc (NIH) 95 0 - 99 mg/dL   LDL/HDL Ratio 1.5 0.0 - 3.2 ratio    Comment:                                     LDL/HDL Ratio                                             Men  Women                               1/2 Avg.Risk  1.0    1.5                                   Avg.Risk  3.6    3.2                                2X Avg.Risk  6.2    5.0                                3X Avg.Risk  8.0    6.1   VITAMIN D 25 Hydroxy (Vit-D Deficiency, Fractures)     Status: None   Collection Time: 08/28/20  9:48 AM   Result Value Ref Range   Vit D, 25-Hydroxy 63.4 30.0 - 100.0 ng/mL    Comment: Vitamin D deficiency has been defined by the [place] practice guideline as a level of  serum 25-OH vitamin D less than 20 ng/mL (1,2). The Endocrine Society went on to further define vitamin D insufficiency as a level between 21 and 29 ng/mL (2). 1. IOM (Institute of Medicine). 2010. Dietary reference    intakes for calcium and D. [place]: The    [employer]. 2. Holick MF, Binkley [place], Bischoff-Ferrari HA, et al.    Evaluation, treatment, and prevention of vitamin D    deficiency: an Endocrine Society clinical practice    guideline. JCEM. 2011 Jul; 96(7):1911-30.   Hemoglobin A1c     Status: Abnormal   Collection Time: 08/28/20  9:48 AM  Result Value Ref Range   Hgb A1c MFr Bld 5.7 (H) 4.8 - 5.6 %    Comment:          Prediabetes: 5.7 - 6.4          Diabetes: >6.4          Glycemic control for adults with diabetes: <7.0    Est. average glucose Bld gHb Est-mCnc 117 mg/dL  CBC     Status: None   Collection Time: 08/28/20  9:48 AM  Result Value Ref Range   WBC 6.0 3.4 - 10.8 x10E3/uL   RBC 4.36 3.77 - 5.28 x10E6/uL   Hemoglobin 13.4 11.1 - 15.9 g/dL   Hematocrit 40.4 34.0 - 46.6 %   MCV 93 79 - 97 fL   MCH 30.7 26.6 - 33.0 pg   MCHC 33.2 31.5 - 35.7 g/dL   RDW 12.5 11.7 - 15.4 %   Platelets 289 150 - 450 x10E3/uL  SARS CORONAVIRUS 2 (TAT 6-24 HRS) Nasopharyngeal Nasopharyngeal Swab     Status: None   Collection Time: 08/29/20 11:11 AM   Specimen: Nasopharyngeal Swab  Result Value Ref Range   SARS Coronavirus 2 NEGATIVE NEGATIVE    Comment: (NOTE) SARS-CoV-2 target nucleic acids are NOT DETECTED.  The SARS-CoV-2 RNA is generally detectable in upper and lower respiratory specimens during the acute phase of infection. Negative results do not preclude SARS-CoV-2 infection, do not rule out co-infections with other pathogens, and should not be  used as the sole basis for treatment or other patient management decisions. Negative results must be combined with clinical observations, patient history, and epidemiological information. The expected result is Negative.  Fact Sheet for Patients: SugarRoll.be  Fact Sheet for Healthcare Providers: https://www.woods-mathews.com/  This test is not yet approved or cleared by the [place] FDA and  has been authorized for detection and/or diagnosis of SARS-CoV-2 by FDA under an Emergency Use Authorization (EUA). This EUA will remain  in effect (meaning this test can be used) for the duration of the COVID-19 declaration under Se ction 564(b)(1) of the Act, 21 U.S.C. section 360bbb-3(b)(1), unless the authorization is terminated or revoked sooner.  Performed at [facility], [place] [address], [place], [place] [address]     Radiology PERIPHERAL VASCULAR CATHETERIZATION  Result Date: 08/31/2020 See op note   Assessment/Plan DVT (deep venous thrombosis) (HCC) Has been on Eliquis and her concerns for malignancy making her high risk for recurrent thrombosis without anticoagulation.  As long as she is tolerating Eliquis well, I think remaining on this long-term is probably in her best interest but will defer to her oncologist/hematologist if they feel otherwise I will see her back as needed.  I did recommend compression socks to help swelling and pain in her left lower leg going forward.  Hypercholesteremia lipid control important in reducing the progression of  atherosclerotic disease. Continue statin therapy     Leotis Pain, MD  09/26/2020 11:20 AM    This note was created with Dragon medical transcription system.  Any errors from dictation are purely unintentional

## 2020-09-28 DIAGNOSIS — R918 Other nonspecific abnormal finding of lung field: Secondary | ICD-10-CM | POA: Diagnosis not present

## 2020-09-28 DIAGNOSIS — Z7901 Long term (current) use of anticoagulants: Secondary | ICD-10-CM | POA: Diagnosis not present

## 2020-09-28 DIAGNOSIS — R911 Solitary pulmonary nodule: Secondary | ICD-10-CM | POA: Diagnosis not present

## 2020-09-28 DIAGNOSIS — Z85528 Personal history of other malignant neoplasm of kidney: Secondary | ICD-10-CM | POA: Diagnosis not present

## 2020-09-28 DIAGNOSIS — Z86718 Personal history of other venous thrombosis and embolism: Secondary | ICD-10-CM | POA: Diagnosis not present

## 2020-09-28 DIAGNOSIS — C641 Malignant neoplasm of right kidney, except renal pelvis: Secondary | ICD-10-CM | POA: Diagnosis not present

## 2020-09-28 DIAGNOSIS — Z905 Acquired absence of kidney: Secondary | ICD-10-CM | POA: Diagnosis not present

## 2020-09-28 DIAGNOSIS — Z87891 Personal history of nicotine dependence: Secondary | ICD-10-CM | POA: Diagnosis not present

## 2020-10-02 DIAGNOSIS — J301 Allergic rhinitis due to pollen: Secondary | ICD-10-CM | POA: Diagnosis not present

## 2020-10-03 DIAGNOSIS — H2512 Age-related nuclear cataract, left eye: Secondary | ICD-10-CM | POA: Diagnosis not present

## 2020-10-03 DIAGNOSIS — H2511 Age-related nuclear cataract, right eye: Secondary | ICD-10-CM | POA: Diagnosis not present

## 2020-10-03 DIAGNOSIS — Z961 Presence of intraocular lens: Secondary | ICD-10-CM | POA: Diagnosis not present

## 2020-10-06 DIAGNOSIS — Z7901 Long term (current) use of anticoagulants: Secondary | ICD-10-CM | POA: Diagnosis not present

## 2020-10-06 DIAGNOSIS — Z86718 Personal history of other venous thrombosis and embolism: Secondary | ICD-10-CM | POA: Diagnosis not present

## 2020-10-06 DIAGNOSIS — R918 Other nonspecific abnormal finding of lung field: Secondary | ICD-10-CM | POA: Diagnosis not present

## 2020-10-09 DIAGNOSIS — J301 Allergic rhinitis due to pollen: Secondary | ICD-10-CM | POA: Diagnosis not present

## 2020-10-16 DIAGNOSIS — J301 Allergic rhinitis due to pollen: Secondary | ICD-10-CM | POA: Diagnosis not present

## 2020-10-23 DIAGNOSIS — J301 Allergic rhinitis due to pollen: Secondary | ICD-10-CM | POA: Diagnosis not present

## 2020-10-30 DIAGNOSIS — J301 Allergic rhinitis due to pollen: Secondary | ICD-10-CM | POA: Diagnosis not present

## 2020-10-30 DIAGNOSIS — I82402 Acute embolism and thrombosis of unspecified deep veins of left lower extremity: Secondary | ICD-10-CM | POA: Diagnosis not present

## 2020-10-30 DIAGNOSIS — R918 Other nonspecific abnormal finding of lung field: Secondary | ICD-10-CM | POA: Diagnosis not present

## 2020-10-30 DIAGNOSIS — C641 Malignant neoplasm of right kidney, except renal pelvis: Secondary | ICD-10-CM | POA: Diagnosis not present

## 2020-10-31 ENCOUNTER — Other Ambulatory Visit: Payer: Self-pay | Admitting: Family Medicine

## 2020-10-31 DIAGNOSIS — E7800 Pure hypercholesterolemia, unspecified: Secondary | ICD-10-CM

## 2020-10-31 DIAGNOSIS — J301 Allergic rhinitis due to pollen: Secondary | ICD-10-CM | POA: Diagnosis not present

## 2020-11-06 DIAGNOSIS — J301 Allergic rhinitis due to pollen: Secondary | ICD-10-CM | POA: Diagnosis not present

## 2020-11-07 DIAGNOSIS — H2511 Age-related nuclear cataract, right eye: Secondary | ICD-10-CM | POA: Diagnosis not present

## 2020-11-07 DIAGNOSIS — H524 Presbyopia: Secondary | ICD-10-CM | POA: Diagnosis not present

## 2020-11-07 DIAGNOSIS — H2512 Age-related nuclear cataract, left eye: Secondary | ICD-10-CM | POA: Diagnosis not present

## 2020-11-13 DIAGNOSIS — J301 Allergic rhinitis due to pollen: Secondary | ICD-10-CM | POA: Diagnosis not present

## 2020-11-20 DIAGNOSIS — J301 Allergic rhinitis due to pollen: Secondary | ICD-10-CM | POA: Diagnosis not present

## 2020-11-27 DIAGNOSIS — J301 Allergic rhinitis due to pollen: Secondary | ICD-10-CM | POA: Diagnosis not present

## 2020-12-11 DIAGNOSIS — J301 Allergic rhinitis due to pollen: Secondary | ICD-10-CM | POA: Diagnosis not present

## 2020-12-12 ENCOUNTER — Other Ambulatory Visit: Payer: Self-pay | Admitting: Family Medicine

## 2020-12-12 DIAGNOSIS — Z1231 Encounter for screening mammogram for malignant neoplasm of breast: Secondary | ICD-10-CM

## 2020-12-18 DIAGNOSIS — J301 Allergic rhinitis due to pollen: Secondary | ICD-10-CM | POA: Diagnosis not present

## 2020-12-25 DIAGNOSIS — J301 Allergic rhinitis due to pollen: Secondary | ICD-10-CM | POA: Diagnosis not present

## 2021-01-01 DIAGNOSIS — J301 Allergic rhinitis due to pollen: Secondary | ICD-10-CM | POA: Diagnosis not present

## 2021-01-04 DIAGNOSIS — Z86718 Personal history of other venous thrombosis and embolism: Secondary | ICD-10-CM | POA: Diagnosis not present

## 2021-01-04 DIAGNOSIS — Z87891 Personal history of nicotine dependence: Secondary | ICD-10-CM | POA: Diagnosis not present

## 2021-01-04 DIAGNOSIS — Z79899 Other long term (current) drug therapy: Secondary | ICD-10-CM | POA: Diagnosis not present

## 2021-01-04 DIAGNOSIS — Z7901 Long term (current) use of anticoagulants: Secondary | ICD-10-CM | POA: Diagnosis not present

## 2021-01-04 DIAGNOSIS — Z85528 Personal history of other malignant neoplasm of kidney: Secondary | ICD-10-CM | POA: Diagnosis not present

## 2021-01-04 DIAGNOSIS — J984 Other disorders of lung: Secondary | ICD-10-CM | POA: Diagnosis not present

## 2021-01-04 DIAGNOSIS — R918 Other nonspecific abnormal finding of lung field: Secondary | ICD-10-CM | POA: Diagnosis not present

## 2021-01-09 DIAGNOSIS — R918 Other nonspecific abnormal finding of lung field: Secondary | ICD-10-CM | POA: Diagnosis not present

## 2021-01-09 DIAGNOSIS — Z86718 Personal history of other venous thrombosis and embolism: Secondary | ICD-10-CM | POA: Diagnosis not present

## 2021-01-09 DIAGNOSIS — Z7901 Long term (current) use of anticoagulants: Secondary | ICD-10-CM | POA: Diagnosis not present

## 2021-01-15 DIAGNOSIS — J301 Allergic rhinitis due to pollen: Secondary | ICD-10-CM | POA: Diagnosis not present

## 2021-01-18 DIAGNOSIS — C641 Malignant neoplasm of right kidney, except renal pelvis: Secondary | ICD-10-CM | POA: Diagnosis not present

## 2021-01-18 DIAGNOSIS — R0602 Shortness of breath: Secondary | ICD-10-CM | POA: Diagnosis not present

## 2021-01-18 DIAGNOSIS — Z79899 Other long term (current) drug therapy: Secondary | ICD-10-CM | POA: Diagnosis not present

## 2021-01-18 DIAGNOSIS — Z01811 Encounter for preprocedural respiratory examination: Secondary | ICD-10-CM | POA: Diagnosis not present

## 2021-01-18 DIAGNOSIS — Z7901 Long term (current) use of anticoagulants: Secondary | ICD-10-CM | POA: Diagnosis not present

## 2021-01-18 DIAGNOSIS — R918 Other nonspecific abnormal finding of lung field: Secondary | ICD-10-CM | POA: Diagnosis not present

## 2021-01-18 DIAGNOSIS — Z01810 Encounter for preprocedural cardiovascular examination: Secondary | ICD-10-CM | POA: Diagnosis not present

## 2021-01-18 DIAGNOSIS — Z87891 Personal history of nicotine dependence: Secondary | ICD-10-CM | POA: Diagnosis not present

## 2021-01-18 DIAGNOSIS — I82402 Acute embolism and thrombosis of unspecified deep veins of left lower extremity: Secondary | ICD-10-CM | POA: Diagnosis not present

## 2021-01-18 DIAGNOSIS — Z01818 Encounter for other preprocedural examination: Secondary | ICD-10-CM | POA: Diagnosis not present

## 2021-01-18 DIAGNOSIS — Z905 Acquired absence of kidney: Secondary | ICD-10-CM | POA: Diagnosis not present

## 2021-01-22 DIAGNOSIS — J301 Allergic rhinitis due to pollen: Secondary | ICD-10-CM | POA: Diagnosis not present

## 2021-01-26 DIAGNOSIS — J301 Allergic rhinitis due to pollen: Secondary | ICD-10-CM | POA: Diagnosis not present

## 2021-01-29 ENCOUNTER — Other Ambulatory Visit: Payer: Self-pay | Admitting: Family Medicine

## 2021-02-04 DIAGNOSIS — Z85528 Personal history of other malignant neoplasm of kidney: Secondary | ICD-10-CM | POA: Diagnosis not present

## 2021-02-04 DIAGNOSIS — Z87891 Personal history of nicotine dependence: Secondary | ICD-10-CM | POA: Diagnosis not present

## 2021-02-04 DIAGNOSIS — C3411 Malignant neoplasm of upper lobe, right bronchus or lung: Secondary | ICD-10-CM | POA: Diagnosis not present

## 2021-02-04 DIAGNOSIS — J984 Other disorders of lung: Secondary | ICD-10-CM | POA: Diagnosis not present

## 2021-02-04 DIAGNOSIS — Z9049 Acquired absence of other specified parts of digestive tract: Secondary | ICD-10-CM | POA: Diagnosis not present

## 2021-02-04 DIAGNOSIS — E7800 Pure hypercholesterolemia, unspecified: Secondary | ICD-10-CM | POA: Diagnosis not present

## 2021-02-04 DIAGNOSIS — Z881 Allergy status to other antibiotic agents status: Secondary | ICD-10-CM | POA: Diagnosis not present

## 2021-02-04 DIAGNOSIS — Z20822 Contact with and (suspected) exposure to covid-19: Secondary | ICD-10-CM | POA: Diagnosis not present

## 2021-02-04 DIAGNOSIS — Z7901 Long term (current) use of anticoagulants: Secondary | ICD-10-CM | POA: Diagnosis not present

## 2021-02-04 DIAGNOSIS — J439 Emphysema, unspecified: Secondary | ICD-10-CM | POA: Diagnosis not present

## 2021-02-04 DIAGNOSIS — Z8616 Personal history of COVID-19: Secondary | ICD-10-CM | POA: Diagnosis not present

## 2021-02-04 DIAGNOSIS — R918 Other nonspecific abnormal finding of lung field: Secondary | ICD-10-CM | POA: Diagnosis not present

## 2021-02-04 DIAGNOSIS — Z85820 Personal history of malignant melanoma of skin: Secondary | ICD-10-CM | POA: Diagnosis not present

## 2021-02-04 DIAGNOSIS — G8918 Other acute postprocedural pain: Secondary | ICD-10-CM | POA: Diagnosis not present

## 2021-02-04 DIAGNOSIS — Z905 Acquired absence of kidney: Secondary | ICD-10-CM | POA: Diagnosis not present

## 2021-02-04 DIAGNOSIS — F419 Anxiety disorder, unspecified: Secondary | ICD-10-CM | POA: Diagnosis not present

## 2021-02-04 DIAGNOSIS — N189 Chronic kidney disease, unspecified: Secondary | ICD-10-CM | POA: Diagnosis not present

## 2021-02-04 DIAGNOSIS — Z4682 Encounter for fitting and adjustment of non-vascular catheter: Secondary | ICD-10-CM | POA: Diagnosis not present

## 2021-02-04 DIAGNOSIS — Z86718 Personal history of other venous thrombosis and embolism: Secondary | ICD-10-CM | POA: Diagnosis not present

## 2021-02-05 HISTORY — PX: LUNG CANCER SURGERY: SHX702

## 2021-02-09 ENCOUNTER — Telehealth: Payer: Self-pay

## 2021-02-09 NOTE — Telephone Encounter (Signed)
Copied from [place] [id number]. Topic: Appointment Scheduling - Scheduling Inquiry for Clinic >> Feb 09, 2021 11:15 AM Pawlus, Brayton Layman A wrote: Reason for CRM: Pts husband wanted to schedule a follow up for the pt to get a stitch looked at and possibly removed. The pt recently had surgery at [facility] and was told to follow up with her PCP to get it removed. Please advise.

## 2021-02-12 ENCOUNTER — Telehealth: Payer: Self-pay

## 2021-02-12 ENCOUNTER — Ambulatory Visit: Payer: Self-pay | Admitting: *Deleted

## 2021-02-12 NOTE — Telephone Encounter (Signed)
Patient was exposed to COVID but tested negative 101.7 nasal congestion, sore throat for 3 days.  Reason for Disposition  [1] HIGH RISK for severe COVID complications (e.g., weak immune system, age > 47 years, obesity with BMI > 25, pregnant, chronic lung disease or other chronic medical condition) AND [2] COVID symptoms (e.g., cough, fever)  (Exceptions: Already seen by PCP and no new or worsening symptoms.)  Answer Assessment - Initial Assessment Questions 1. COVID-19 EXPOSURE: "Please describe how you were exposed to someone with a COVID-19 infection."     Close household contact 2. PLACE of CONTACT: "Where were you when you were exposed to COVID-19?" (e.g., home, school, medical waiting room; which city?)     Home 3. TYPE of CONTACT: "How much contact was there?" (e.g., sitting next to, live in same house, work in same office, same building)     Side to side 4. DURATION of CONTACT: "How long were you in contact with the COVID-19 patient?" (e.g., a few seconds, passed by person, a few minutes, 15 minutes or longer, live with the patient)     1 hour 5. MASK: "Were you wearing a mask?" "Was the other person wearing a mask?" Note: wearing a mask reduces the risk of an otherwise close contact.     No mask 6. DATE of CONTACT: "When did you have contact with a COVID-19 patient?" (e.g., how many days ago)     Saturday 8/6 7. COMMUNITY SPREAD: "Are there lots of cases of COVID-19 (community spread) where you live?" (See public health department website, if unsure)       yes 8. SYMPTOMS: "Do you have any symptoms?" (e.g., fever, cough, breathing difficulty, loss of taste or smell)     Fever 101.7 yesterday. 99.7 this AM without tylenol 9. VACCINE: "Have you gotten the COVID-19 vaccine?" If Yes, ask: "Which one, how many shots, when did you get it?"     Yes 10. BOOSTER: "Have you received your COVID-19 booster?" If Yes, ask: "Which one and when did you get it?"       One  Jan 2022, pfizer.  12.  HIGH RISK: "Do you have any heart or lung problems?" (e.g., asthma , COPD, heart failure) "Do you have a weak immune system or other risk factors?" (e.g., HIV positive, chemotherapy, renal failure, diabetes mellitus, sickle cell anemia, obesity)       *No Answer*  Protocols used: Coronavirus (COVID-19) Exposure-A-AH, Coronavirus (COVID-19) Diagnosed or Suspected-A-AH

## 2021-02-12 NOTE — Telephone Encounter (Signed)
Pt reports direct exposure to covid., health care worked changing dressing. Pt had recent 02/08/21 lung surgery. Exposure Saturday. Tested neg home test yesterday. Pt with fever 101.7 last night, 99.7 this AM, without tylenol. Reports cough, sore throat, nasal congestion.  States has had cough post op, "About the same." Productive for "Milky yellow phlegm. States had noted small amounts bloody mucous since surgery, not presently. Denies any worsening SOB, "Have been a little since surgery."  Assured pt NT would route to practice for PCPs review and final disposition. Spoke with Jiles Garter, will route to Dr. Brita Romp when she arrives to practice.

## 2021-02-12 NOTE — Telephone Encounter (Signed)
Copied from [place] [id number]. Topic: General - Other >> Feb 12, 2021  1:39 PM Tessa Lerner A wrote: Reason for CRM: Patient would like to be contacted by a member of staff when possible  Patient would like to discuss their clinical call from this morning 02/12/21 with a member of clinical staff when possible  Please contact further

## 2021-02-12 NOTE — Telephone Encounter (Signed)
Keep taking tests. Unfortunately, can not use Paxlovid under EUA without positive COVID test, regardless of exposure.

## 2021-02-13 ENCOUNTER — Telehealth (INDEPENDENT_AMBULATORY_CARE_PROVIDER_SITE_OTHER): Payer: Medicare Other | Admitting: Family Medicine

## 2021-02-13 ENCOUNTER — Encounter: Payer: Self-pay | Admitting: Family Medicine

## 2021-02-13 ENCOUNTER — Other Ambulatory Visit: Payer: Self-pay

## 2021-02-13 VITALS — Temp 99.9°F

## 2021-02-13 DIAGNOSIS — U071 COVID-19: Secondary | ICD-10-CM

## 2021-02-13 MED ORDER — MOLNUPIRAVIR EUA 200MG CAPSULE: 4.0000 | ORAL_CAPSULE | Freq: Two times a day (BID) | ORAL | 0 refills | Status: AC

## 2021-02-13 MED ORDER — NIRMATRELVIR/RITONAVIR (PAXLOVID) TABLET (RENAL DOSING): 2.0000 | ORAL_TABLET | Freq: Two times a day (BID) | ORAL | 0 refills | Status: DC

## 2021-02-13 NOTE — Telephone Encounter (Signed)
Pt got a positive covid test today.  Pt is congested, fever since Sunday. No difficulty breathing right now.  Would appreciate the Paxlovid as soon a possible.  [id number] [address], [place]

## 2021-02-13 NOTE — Telephone Encounter (Signed)
Pt has 1 stitch that needs to be removed. It was put in last Wed, so it has been 7 days. However, pt is covid positive today. Please advise

## 2021-02-13 NOTE — Progress Notes (Signed)
Virtual telephone visit    Virtual Visit via Telephone Note   This visit type was conducted due to national recommendations for restrictions regarding the COVID-19 Pandemic (e.g. social distancing) in an effort to limit this patient's exposure and mitigate transmission in our community. Due to her co-morbid illnesses, this patient is at least at moderate risk for complications without adequate follow up. This format is felt to be most appropriate for this patient at this time. The patient did not have access to video technology or had technical difficulties with video requiring transitioning to audio format only (telephone). Physical exam was limited to content and character of the telephone converstion.     Patient location: home Provider location: [place] involved in the visit: patient, provider   I discussed the limitations of evaluation and management by telemedicine and the availability of in person appointments. The patient expressed understanding and agreed to proceed.   Visit Date: 02/13/2021  Today's healthcare provider: Lavon Paganini, MD   Chief Complaint  Patient presents with   Covid Positive    Patient presents today to address concern of URI like symptoms since testing positive for covid today at home. Patient reports that symptoms began on Sunday 02/11/21, she states that she took home test on 02/12/21 but was negative. Patient reports today fever high of 101, cough, congestion, body aches, sore throat, and headache. Patient has taken otc Tylenol with little relief.    Subjective    HPI HPI     Covid Positive           Comments: Patient presents today to address concern of URI like symptoms since testing positive for covid today at home. Patient reports that symptoms began on Sunday 02/11/21, she states that she took home test on 02/12/21 but was negative. Patient reports today fever high of 101, cough, congestion, body aches, sore throat,  and headache. Patient has taken otc Tylenol with little relief.        Last edited by Minette Headland, CMA on 02/13/2021  2:07 PM.      Had surgery on her lung last week (Thoracoscopic wedge resection)      Medications: Outpatient Medications Prior to Visit  Medication Sig   acetaminophen (TYLENOL) 325 MG tablet Take 650 mg by mouth every 6 (six) hours as needed.   B COMPLEX-C PO Take 1 tablet by mouth daily.    Calcium Carbonate-Vitamin D 600-200 MG-UNIT CAPS Take 2 capsules by mouth daily.    Cetirizine HCl 10 MG CAPS Take 1 capsule by mouth daily.    Cholecalciferol 1000 UNITS capsule Take 1,000 Units by mouth daily.    clonazePAM (KLONOPIN) 0.5 MG tablet Take 0.5 mg by mouth 2 (two) times daily as needed for anxiety.   ELIQUIS 5 MG TABS tablet TAKE 1 TABLET BY MOUTH TWICE A DAY   fluticasone (FLONASE) 50 MCG/ACT nasal spray Place into both nostrils.   mometasone (ELOCON) 0.1 % lotion Apply topically.   MULTIPLE VITAMIN PO Take 1 tablet by mouth daily.    Omega-3 Fatty Acids (FISH OIL) 1200 MG CAPS Take 1 capsule by mouth daily.    psyllium (REGULOID) 0.52 G capsule Take 1 capsule by mouth 3 (three) times daily.    sertraline (ZOLOFT) 50 MG tablet TAKE 1 TABLET BY MOUTH ONCE A DAY   simvastatin (ZOCOR) 10 MG tablet TAKE ONE TABLET BY MOUTH AT BEDTIME   [DISCONTINUED] amoxicillin (AMOXIL) 500 MG capsule Take 500 mg by mouth  3 (three) times daily. (Patient not taking: Reported on 08/31/2020)   No facility-administered medications prior to visit.    Review of Systems - per HPI    Objective    Temp 99.9 F (37.7 C) (Oral)      Assessment & Plan     1. COVID-19 - new problem (on day 3 of symptoms) - not candidate for Paxlovid due to taking eliquis - will Rx Monulpiravir instead - discussed symptom management, return/emergent precautions - she is high risk for COVID complications given medical conditions, age, and recent lung wedge resection  She reports that her  surgeon told her to have post-op suture removal with PCP - discussed that we expect surgeons to remove sutures from their surgeries. She will call back if she gets a lot of push back.  Meds ordered this encounter  Medications   molnupiravir EUA 200 mg CAPS    Sig: Take 4 capsules (800 mg total) by mouth 2 (two) times daily for 5 days.    Dispense:  40 capsule    Refill:  0     No follow-ups on file.    I discussed the assessment and treatment plan with the patient. The patient was provided an opportunity to ask questions and all were answered. The patient agreed with the plan and demonstrated an understanding of the instructions.   The patient was advised to call back or seek an in-person evaluation if the symptoms worsen or if the condition fails to improve as anticipated.  I provided 12 minutes of non-face-to-face time during this encounter.  I, Lavon Paganini, MD, have reviewed all documentation for this visit. The documentation on 02/13/21 for the exam, diagnosis, procedures, and orders are all accurate and complete.   [name], Dionne Bucy, MD, MPH [facility]

## 2021-02-13 NOTE — Telephone Encounter (Signed)
Patient has appointment today at 3pm with PCP, PCP addressed in TE noted yesterday.    Message from Erick Blinks sent at 02/13/2021 12:36 PM EDT  Summary: COVID   Pt's daughter called and requested a call back since patient has tested positive. Pt is scheduled for a VV at 3:00pm today with PCP

## 2021-02-13 NOTE — Telephone Encounter (Signed)
Pts daughter called again today /she called yesterday as well/ her concern is that her mom just had lung cancer surgery to remove half of lung / daughter and her husband has tested positive for covid and now the pt has a fever and congestion/ she took a test over the weekend and was negative / they wanted to get anti viral meds for the pt for covid / I advised her she would need a positive result / pt will be tested again today and they will call back/ they wanted to know if a home test is ok or if she needs a test at a location / please advise

## 2021-02-13 NOTE — Addendum Note (Signed)
Addended by: Althea Charon D on: 02/13/2021 03:24 PM   Modules accepted: Orders

## 2021-02-13 NOTE — Telephone Encounter (Signed)
We can see if Rita Webb could remove the stitch for her. I am not able to fit test and cannot see her while COVID positive. See other message re Paxlovid renally dosed

## 2021-02-13 NOTE — Telephone Encounter (Signed)
Pt hs appointment today

## 2021-02-13 NOTE — Telephone Encounter (Signed)
Please send in renally dosed Paxlovid EUA (last GFR 59).

## 2021-02-19 ENCOUNTER — Ambulatory Visit: Payer: Medicare Other

## 2021-02-22 DIAGNOSIS — C3491 Malignant neoplasm of unspecified part of right bronchus or lung: Secondary | ICD-10-CM | POA: Insufficient documentation

## 2021-02-22 IMAGING — US US EXTREM LOW VENOUS*L*
1 series · 13 of 24 positions shown · non-contrast
Comparison: None.

CLINICAL DATA: Renal cell carcinoma, superficial thrombophlebitis
left lower extremity



[Series 1: us extrem low venous*left* · 0.07mm/px · 13 of 46 slices shown]
[im 1/46]
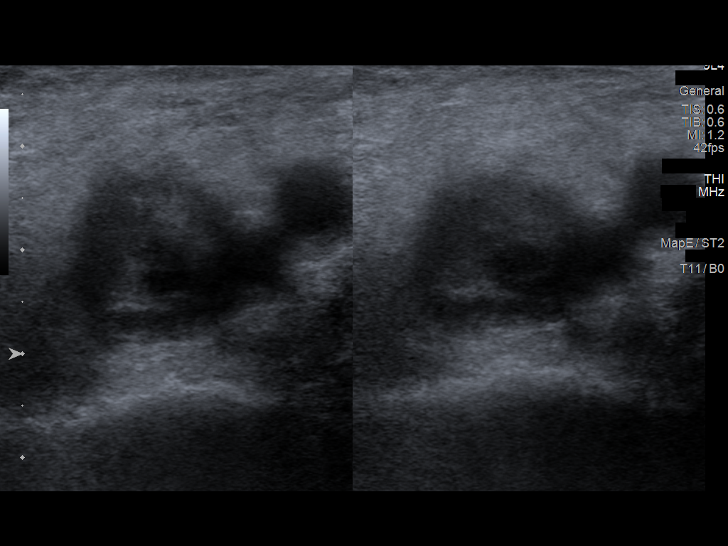
[im 4/46]
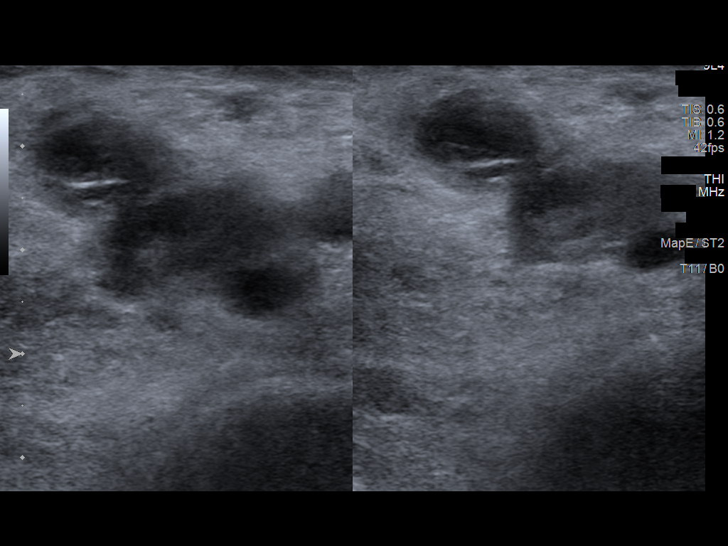
[im 8/46]
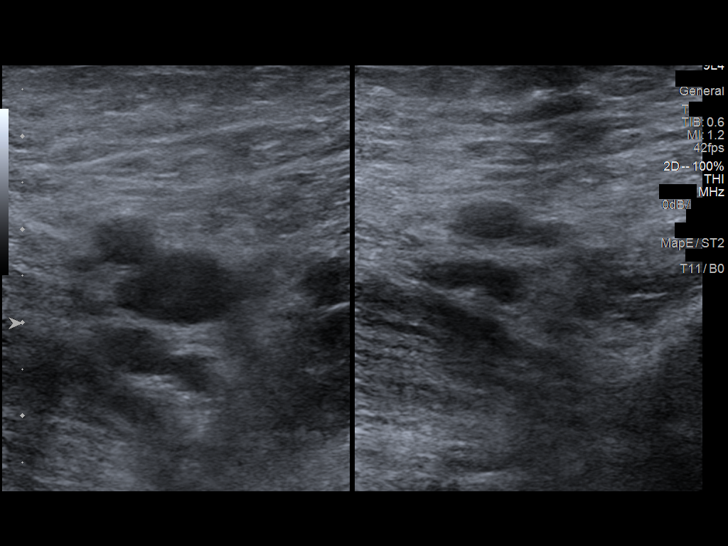
[im 12/46]
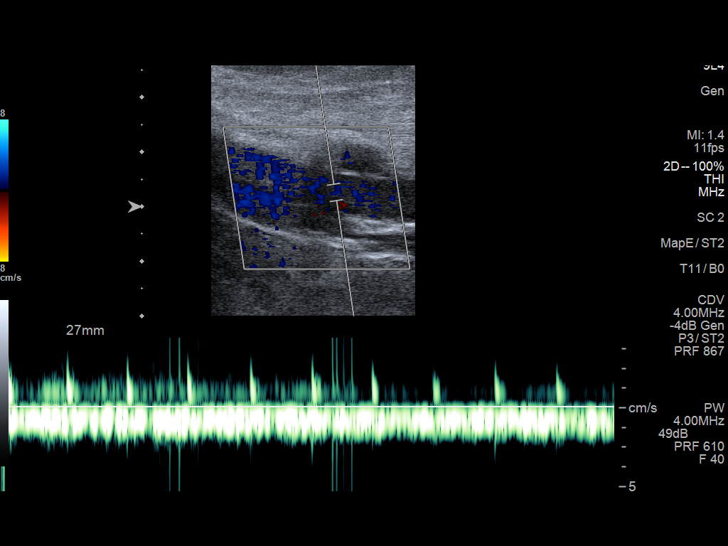
[im 16/46]
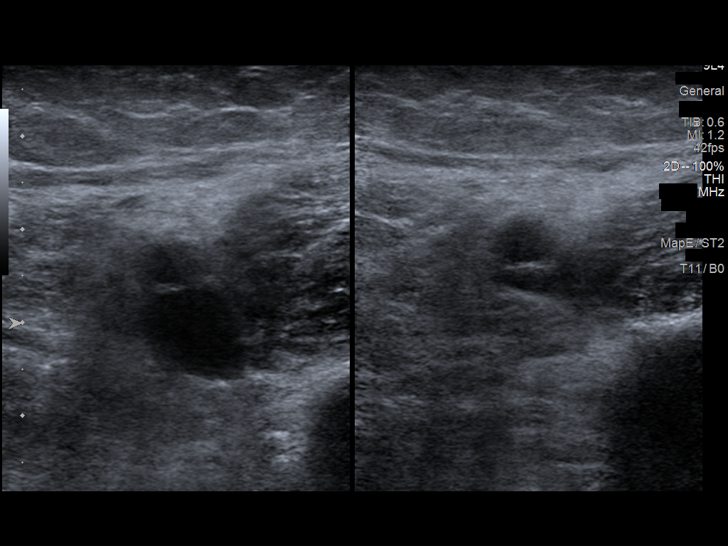
[im 20/46]
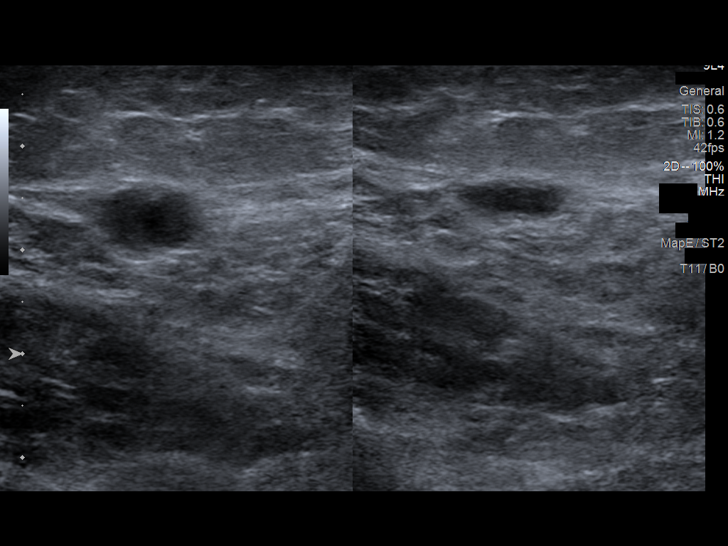
[im 24/46]
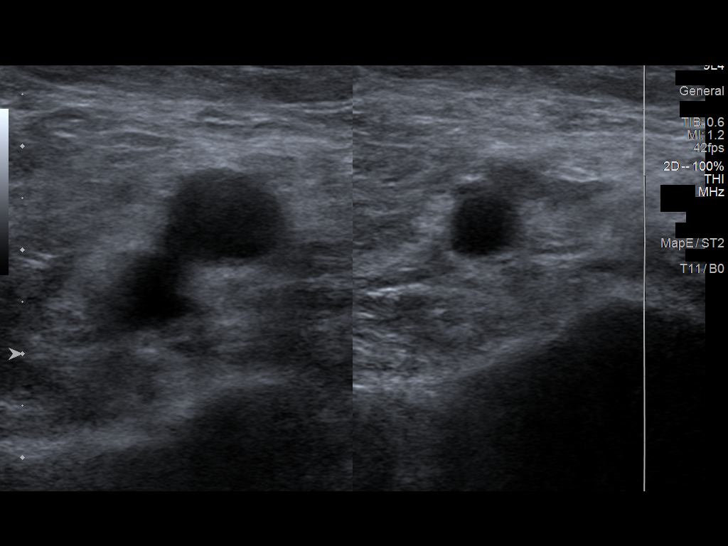
[im 26/46]
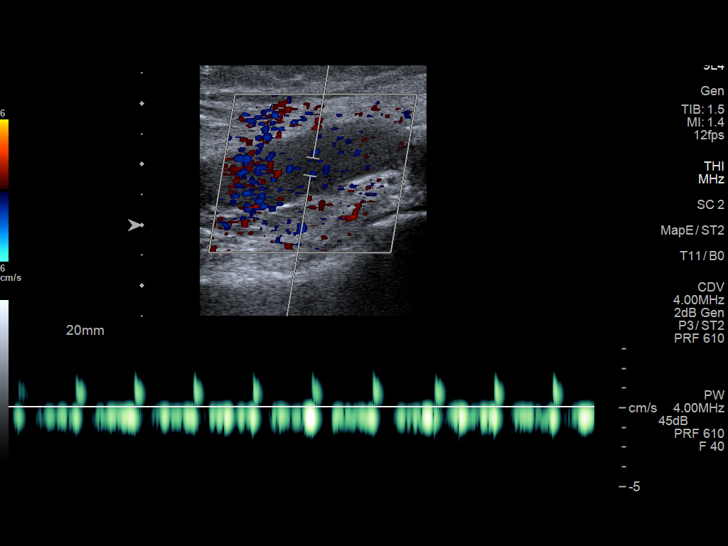
[im 30/46]
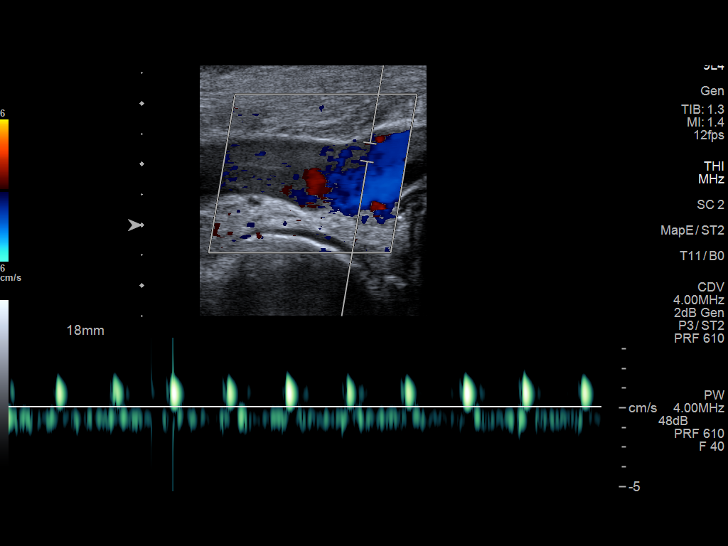
[im 34/46]
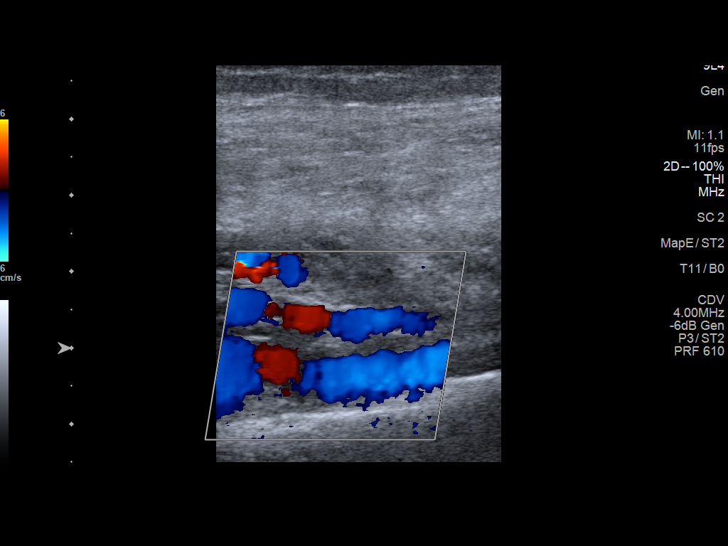
[im 38/46]
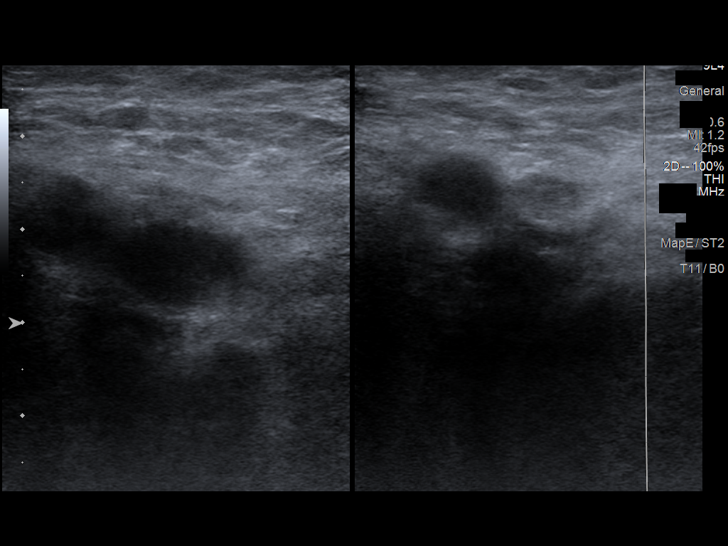
[im 42/46]
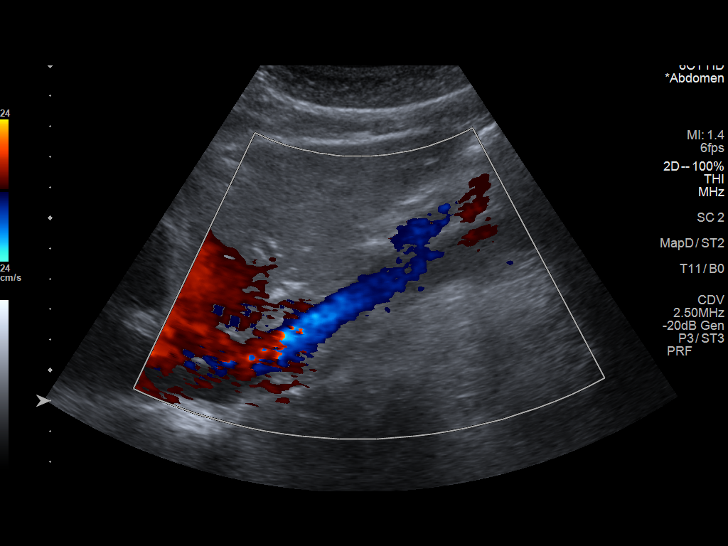
[im 46/46]
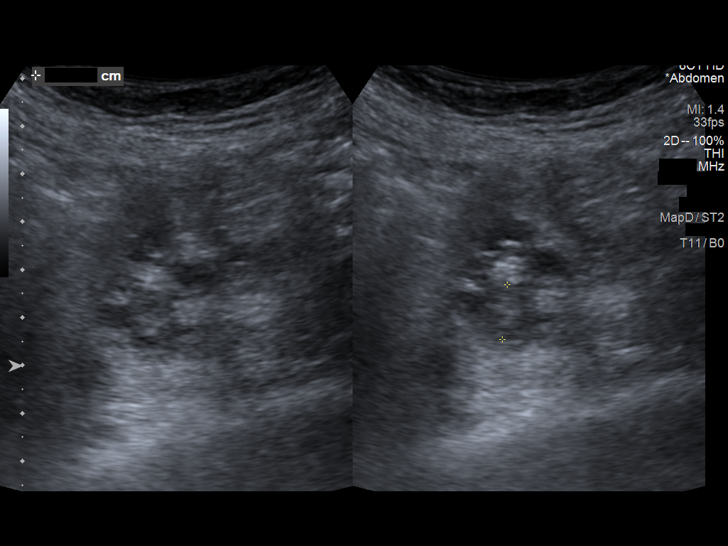

[13 of 24 positions shown; findings below may reference images not displayed]

FINDINGS: Contralateral Common Femoral Vein: Respiratory phasicity is normal
and symmetric with the symptomatic side. No evidence of thrombus.
Normal compressibility.

Common Femoral Vein: There is occlusive thrombus within the left
common femoral vein extending to the saphenous junction.

Saphenofemoral Junction: Occlusive thrombus at the saphenofemoral
junction.

Profunda Femoral Vein: No evidence of thrombus. Normal
compressibility and flow on color Doppler imaging.

Femoral Vein: No evidence of thrombus. Normal compressibility,
respiratory phasicity and response to augmentation.

Popliteal Vein: No evidence of thrombus. Normal compressibility,
respiratory phasicity and response to augmentation.

Calf Veins: No evidence of thrombus. Normal compressibility and flow
on color Doppler imaging. Varicosities are identified at the site of
palpable abnormality.

Superficial Great Saphenous Vein: There is occlusive thrombus
extending from the proximal through the distal thigh.

Other Findings: There is thrombus within the distal margin of the
left external iliac vein extending into the common femoral vein. The
proximal aspect of the external iliac vein is not well visualized,
but the IVC is patent.
IMPRESSION: 1. Deep venous thrombosis extending from the distal aspect of the
left external iliac vein through the level of the common femoral
vein at the saphenofemoral junction.
2. Superficial thrombosis of the left greater saphenous vein from
the proximal through the distal thigh.
3. Patent calf veins, though superficial varicosities are identified
at the site of palpable abnormality.

## 2021-02-27 IMAGING — US US EXTREM LOW VENOUS*L*
1 series · 13 of 24 positions shown · non-contrast
Comparison: Ultrasound dated 10/08/2019.

CLINICAL DATA: 74-year-old female with left lower extremity pain
and swelling x1 day. Left lower extremity DVT seen on the recent
ultrasound.



[Series 1: us venous img lower uni left (dvt) · portal-venous · 13 of 39 slices shown]
[im 1/39]
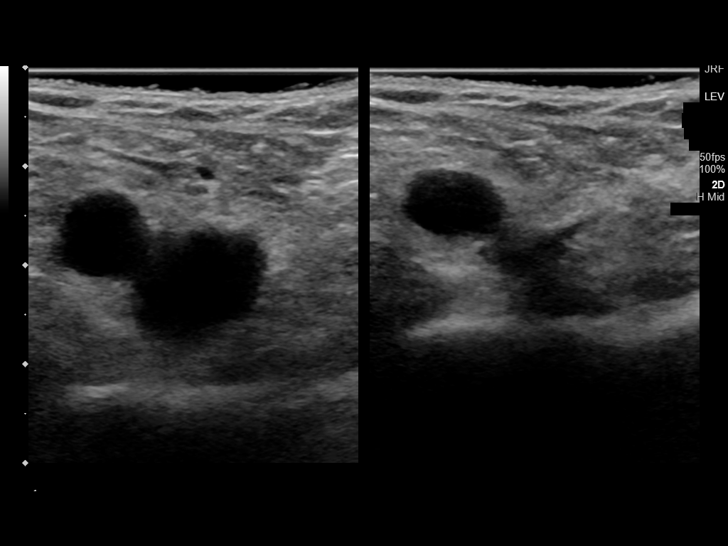
[im 4/39]
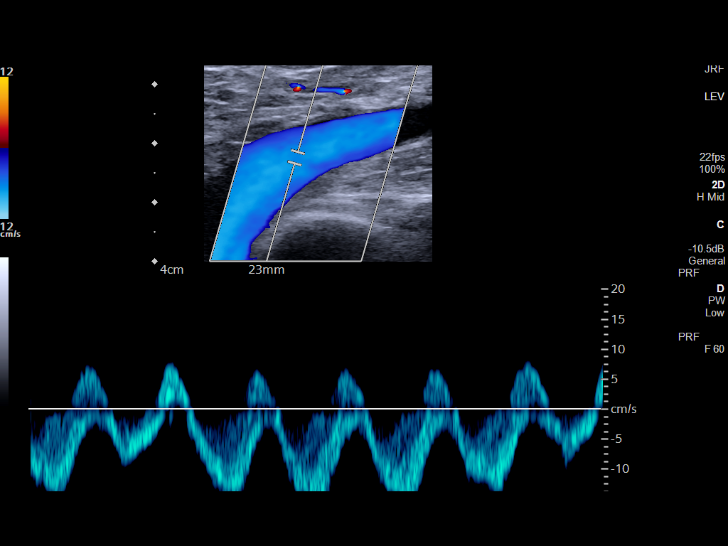
[im 7/39]
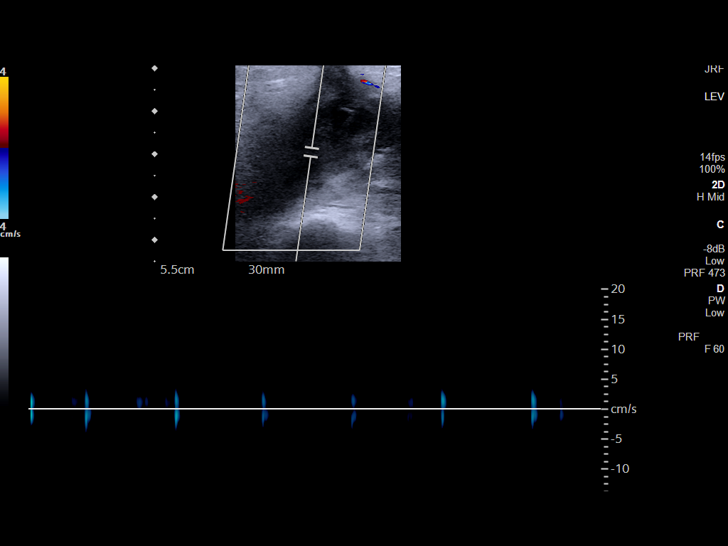
[im 10/39]
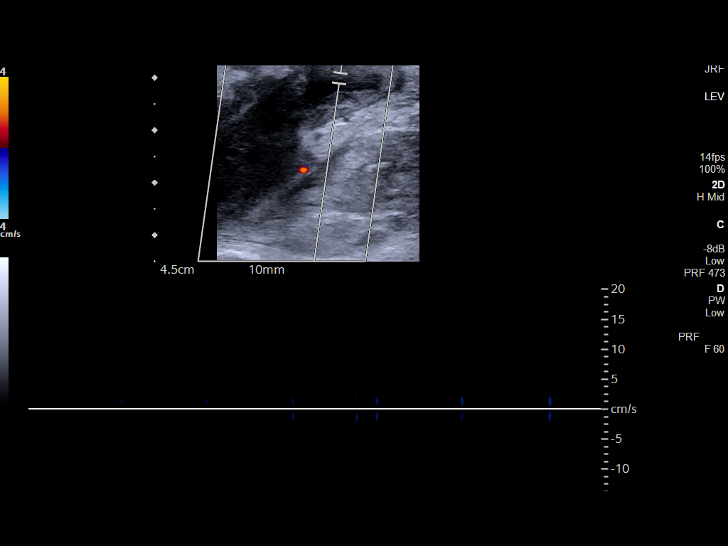
[im 14/39]
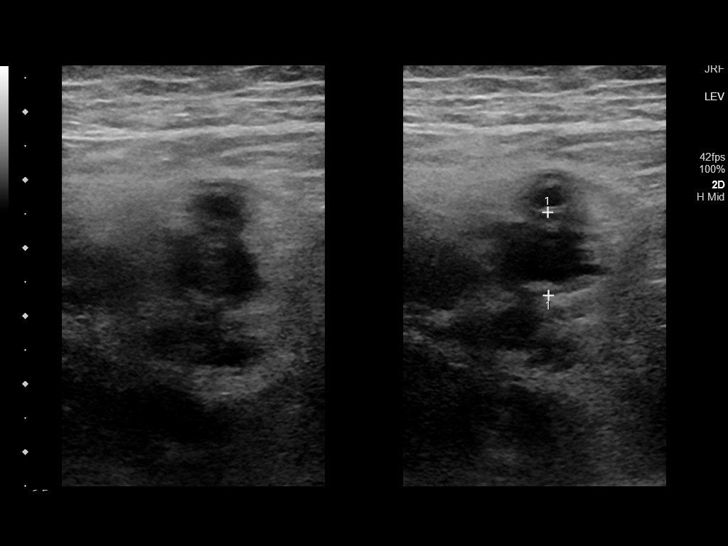
[im 17/39]
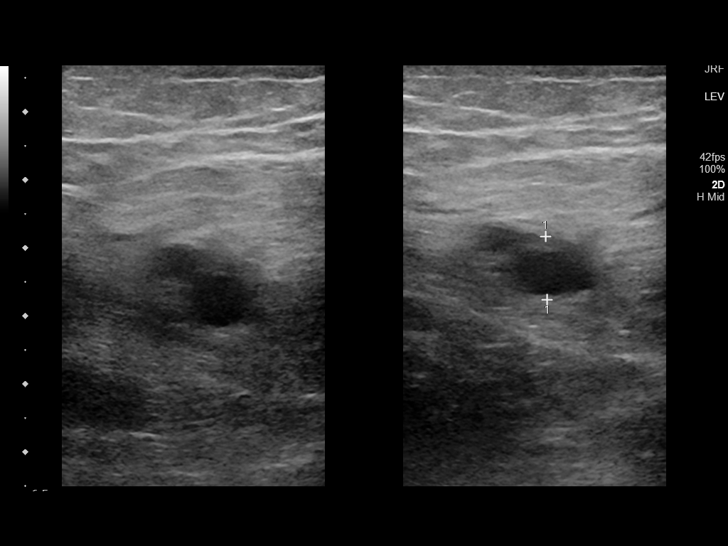
[im 20/39]
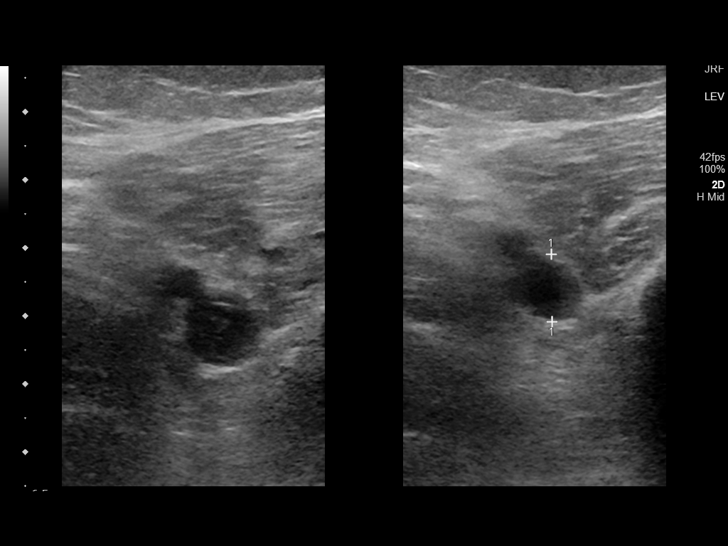
[im 22/39]
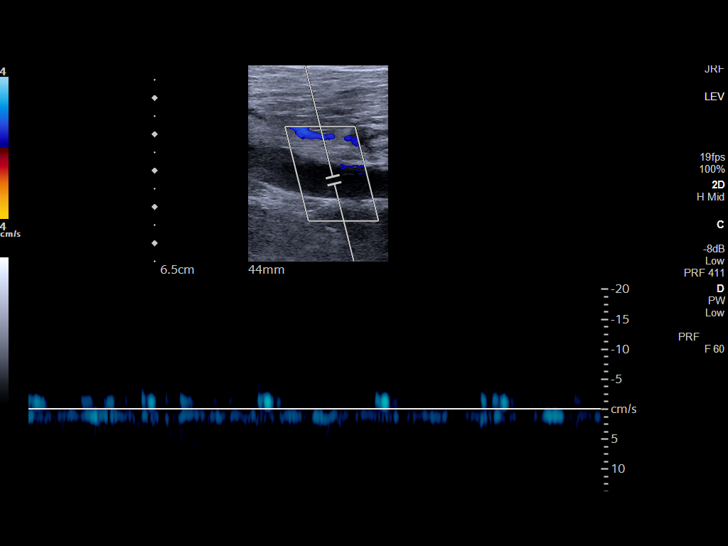
[im 25/39]
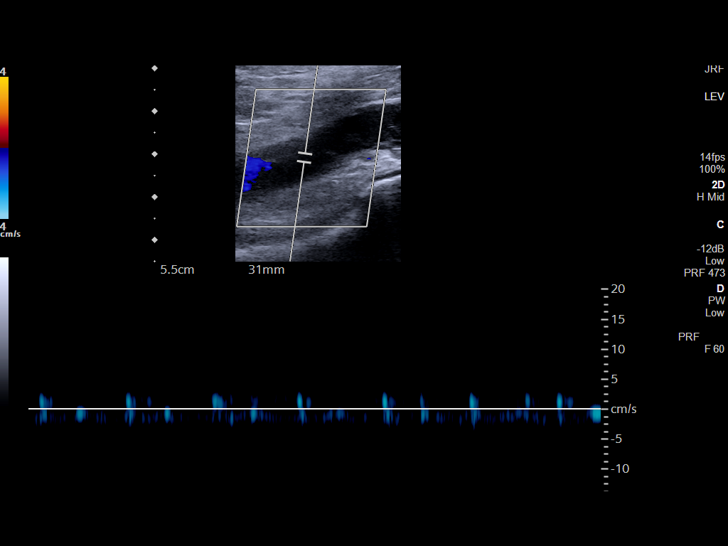
[im 29/39]
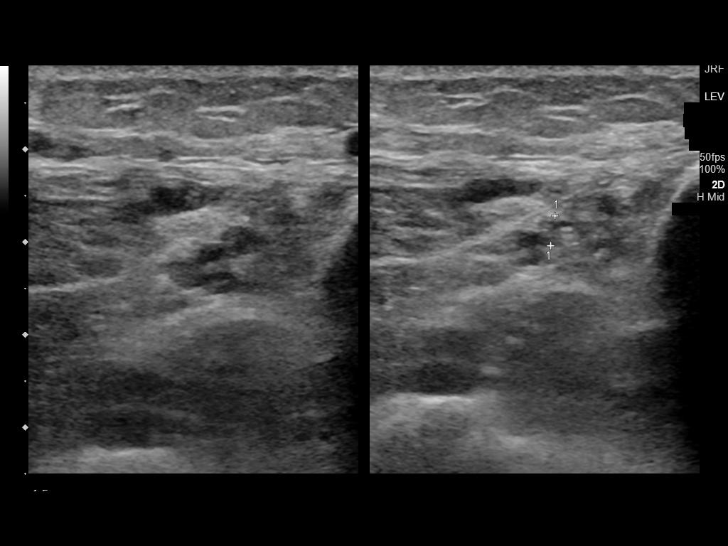
[im 32/39]
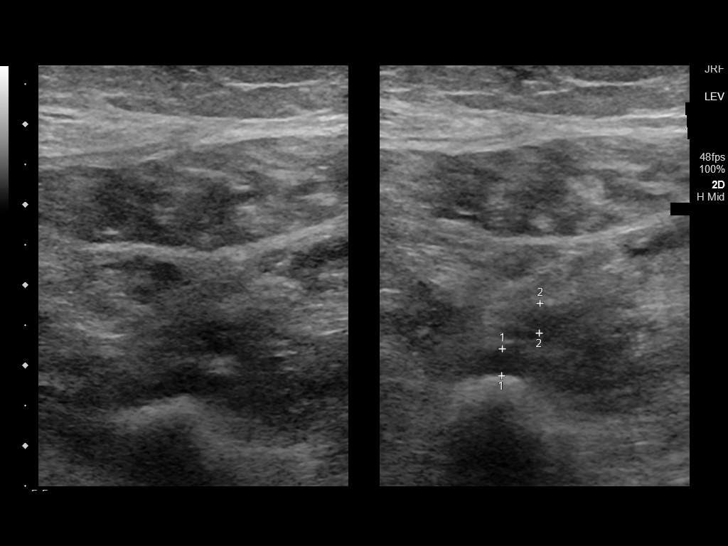
[im 35/39]
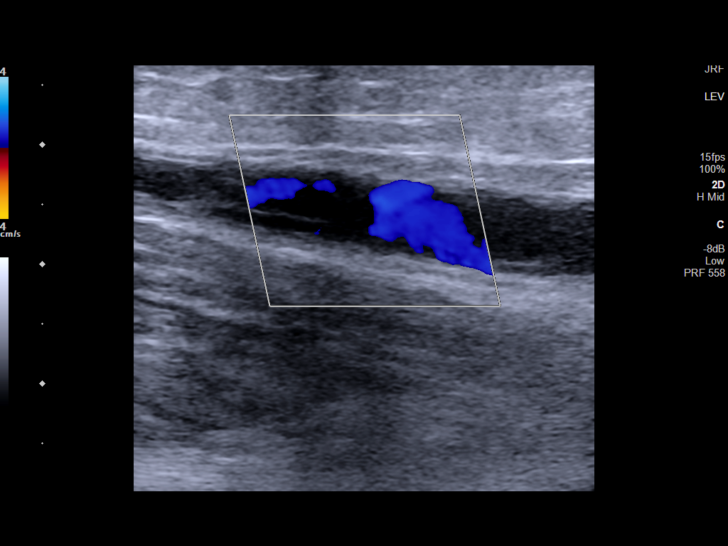
[im 39/39]
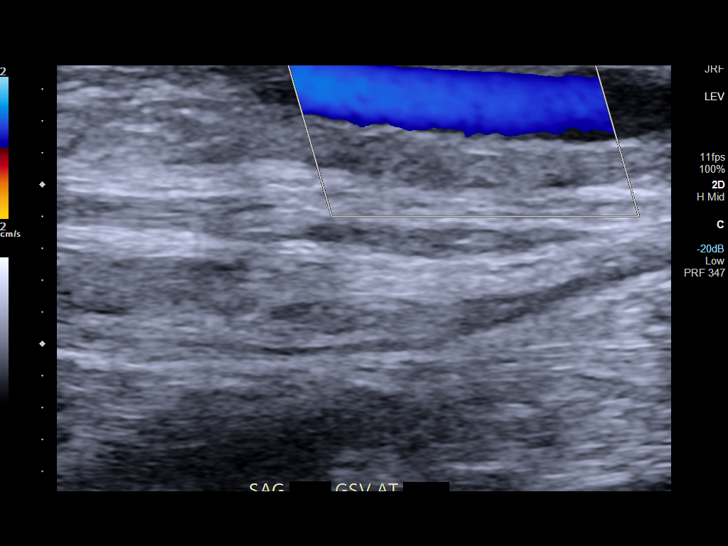

[13 of 24 positions shown; findings below may reference images not displayed]

FINDINGS: Contralateral Common Femoral Vein: Respiratory phasicity is normal
and symmetric with the symptomatic side. No evidence of thrombus.
Normal compressibility.

Common Femoral Vein: Occlusive thrombus with distension and
noncompression of the vessel.

Saphenofemoral Junction: Occlusive thrombus with none compression.

Profunda Femoral Vein: Occlusive thrombus with noncompression.

Femoral Vein: Occlusive thrombus.  No flow or compression.

Popliteal Vein: Occlusive thrombus with distension of the vessel and
noncompression.

Calf Veins: There is near complete occlusion of the peroneal vein.
The posterior tibial vein appears patent.

Superficial Great Saphenous Vein: Near complete occlusive thrombus
through the great saphenous vein at the thigh. The visualized great
saphenous vein is patent at the level of the knee.

Venous Reflux:  None.

Other Findings:  None.
IMPRESSION: Extensive occlusive left lower extremity DVT with proximal extension
to the common femoral vein. Overall progression of the clot burden
since the prior ultrasound.

These results were called by telephone at the time of interpretation
on 10/13/2019 at [DATE] to provider SUNAGAWA IKUE , who verbally
acknowledged these results.

## 2021-03-06 DIAGNOSIS — D2261 Melanocytic nevi of right upper limb, including shoulder: Secondary | ICD-10-CM | POA: Diagnosis not present

## 2021-03-06 DIAGNOSIS — D2272 Melanocytic nevi of left lower limb, including hip: Secondary | ICD-10-CM | POA: Diagnosis not present

## 2021-03-06 DIAGNOSIS — Z85828 Personal history of other malignant neoplasm of skin: Secondary | ICD-10-CM | POA: Diagnosis not present

## 2021-03-06 DIAGNOSIS — Z85820 Personal history of malignant melanoma of skin: Secondary | ICD-10-CM | POA: Diagnosis not present

## 2021-03-21 DIAGNOSIS — H60543 Acute eczematoid otitis externa, bilateral: Secondary | ICD-10-CM | POA: Diagnosis not present

## 2021-03-21 DIAGNOSIS — J309 Allergic rhinitis, unspecified: Secondary | ICD-10-CM | POA: Diagnosis not present

## 2021-03-21 DIAGNOSIS — J300 Vasomotor rhinitis: Secondary | ICD-10-CM | POA: Diagnosis not present

## 2021-03-21 DIAGNOSIS — R059 Cough, unspecified: Secondary | ICD-10-CM | POA: Diagnosis not present

## 2021-03-26 DIAGNOSIS — J301 Allergic rhinitis due to pollen: Secondary | ICD-10-CM | POA: Diagnosis not present

## 2021-04-02 DIAGNOSIS — J301 Allergic rhinitis due to pollen: Secondary | ICD-10-CM | POA: Diagnosis not present

## 2021-04-05 ENCOUNTER — Telehealth: Payer: Self-pay

## 2021-04-05 NOTE — Progress Notes (Signed)
Left message for patient to call back and schedule Medicare Annual Wellness Visit (AWV).    Please offer to do virtually or by telephone sometime this week    Last AWV:02/21/2021    Please schedule at anytime with [facility] Health Advisor.    If any questions, please contact me at [place], [place], [place],  [address] Direct Dial: [phone] [email or web address]

## 2021-04-09 DIAGNOSIS — J301 Allergic rhinitis due to pollen: Secondary | ICD-10-CM | POA: Diagnosis not present

## 2021-04-13 DIAGNOSIS — Z7901 Long term (current) use of anticoagulants: Secondary | ICD-10-CM | POA: Diagnosis not present

## 2021-04-13 DIAGNOSIS — R918 Other nonspecific abnormal finding of lung field: Secondary | ICD-10-CM | POA: Diagnosis not present

## 2021-04-13 DIAGNOSIS — I82402 Acute embolism and thrombosis of unspecified deep veins of left lower extremity: Secondary | ICD-10-CM | POA: Diagnosis not present

## 2021-04-13 DIAGNOSIS — C641 Malignant neoplasm of right kidney, except renal pelvis: Secondary | ICD-10-CM | POA: Diagnosis not present

## 2021-04-16 DIAGNOSIS — J301 Allergic rhinitis due to pollen: Secondary | ICD-10-CM | POA: Diagnosis not present

## 2021-04-30 DIAGNOSIS — J301 Allergic rhinitis due to pollen: Secondary | ICD-10-CM | POA: Diagnosis not present

## 2021-05-01 ENCOUNTER — Other Ambulatory Visit: Payer: Self-pay | Admitting: Family Medicine

## 2021-05-01 DIAGNOSIS — E7800 Pure hypercholesterolemia, unspecified: Secondary | ICD-10-CM

## 2021-05-01 NOTE — Telephone Encounter (Signed)
Requested Prescriptions  Pending Prescriptions Disp Refills  . simvastatin (ZOCOR) 10 MG tablet [Pharmacy Med Name: SIMVASTATIN 10 MG TAB] 90 tablet 1    Sig: TAKE ONE TABLET BY MOUTH AT BEDTIME     Cardiovascular:  Antilipid - Statins Passed - 05/01/2021 10:52 AM      Passed - Total Cholesterol in normal range and within 360 days    Cholesterol, Total  Date Value Ref Range Status  08/28/2020 171 100 - 199 mg/dL Final         Passed - LDL in normal range and within 360 days    LDL Chol Calc (NIH)  Date Value Ref Range Status  08/28/2020 95 0 - 99 mg/dL Final         Passed - HDL in normal range and within 360 days    HDL  Date Value Ref Range Status  08/28/2020 63 >39 mg/dL Final         Passed - Triglycerides in normal range and within 360 days    Triglycerides  Date Value Ref Range Status  08/28/2020 69 0 - 149 mg/dL Final         Passed - Patient is not pregnant      Passed - Valid encounter within last 12 months    Recent Outpatient Visits          2 months ago [place], Dionne Bucy, MD   8 months ago Encounter for annual physical exam   [facility], Dionne Bucy, MD   1 year ago Adjustment disorder with anxiety   [facility] [place], Dionne Bucy, MD   1 year ago Adjustment disorder with anxiety   [facility] [place], Dionne Bucy, MD   1 year ago Acute deep vein thrombosis (DVT) of iliac vein of left lower extremity [facility])   [place], Dionne Bucy, MD      Future Appointments            In 4 months [place], Dionne Bucy, MD [facility] [place]

## 2021-05-07 DIAGNOSIS — J301 Allergic rhinitis due to pollen: Secondary | ICD-10-CM | POA: Diagnosis not present

## 2021-05-08 ENCOUNTER — Ambulatory Visit
Admission: RE | Admit: 2021-05-08 | Discharge: 2021-05-08 | Disposition: A | Discharge status: Home / Self Care | Payer: Medicare Other | Source: Ambulatory Visit | Attending: Family Medicine | Admitting: Family Medicine

## 2021-05-08 DIAGNOSIS — Z1231 Encounter for screening mammogram for malignant neoplasm of breast: Secondary | ICD-10-CM

## 2021-05-10 DIAGNOSIS — H401213 Low-tension glaucoma, right eye, severe stage: Secondary | ICD-10-CM | POA: Diagnosis not present

## 2021-05-10 DIAGNOSIS — H401221 Low-tension glaucoma, left eye, mild stage: Secondary | ICD-10-CM | POA: Diagnosis not present

## 2021-05-11 DIAGNOSIS — J301 Allergic rhinitis due to pollen: Secondary | ICD-10-CM | POA: Diagnosis not present

## 2021-05-14 DIAGNOSIS — J301 Allergic rhinitis due to pollen: Secondary | ICD-10-CM | POA: Diagnosis not present

## 2021-05-21 DIAGNOSIS — J301 Allergic rhinitis due to pollen: Secondary | ICD-10-CM | POA: Diagnosis not present

## 2021-05-28 DIAGNOSIS — J301 Allergic rhinitis due to pollen: Secondary | ICD-10-CM | POA: Diagnosis not present

## 2021-06-04 DIAGNOSIS — J301 Allergic rhinitis due to pollen: Secondary | ICD-10-CM | POA: Diagnosis not present

## 2021-06-11 DIAGNOSIS — J301 Allergic rhinitis due to pollen: Secondary | ICD-10-CM | POA: Diagnosis not present

## 2021-06-18 DIAGNOSIS — J301 Allergic rhinitis due to pollen: Secondary | ICD-10-CM | POA: Diagnosis not present

## 2021-06-25 DIAGNOSIS — J301 Allergic rhinitis due to pollen: Secondary | ICD-10-CM | POA: Diagnosis not present

## 2021-06-28 DIAGNOSIS — Z85118 Personal history of other malignant neoplasm of bronchus and lung: Secondary | ICD-10-CM | POA: Diagnosis not present

## 2021-06-28 DIAGNOSIS — Z85528 Personal history of other malignant neoplasm of kidney: Secondary | ICD-10-CM | POA: Diagnosis not present

## 2021-06-28 DIAGNOSIS — K769 Liver disease, unspecified: Secondary | ICD-10-CM | POA: Diagnosis not present

## 2021-06-28 DIAGNOSIS — Z905 Acquired absence of kidney: Secondary | ICD-10-CM | POA: Diagnosis not present

## 2021-06-28 DIAGNOSIS — C3491 Malignant neoplasm of unspecified part of right bronchus or lung: Secondary | ICD-10-CM | POA: Diagnosis not present

## 2021-06-28 DIAGNOSIS — Z79899 Other long term (current) drug therapy: Secondary | ICD-10-CM | POA: Diagnosis not present

## 2021-06-28 DIAGNOSIS — C641 Malignant neoplasm of right kidney, except renal pelvis: Secondary | ICD-10-CM | POA: Diagnosis not present

## 2021-06-28 DIAGNOSIS — Z87891 Personal history of nicotine dependence: Secondary | ICD-10-CM | POA: Diagnosis not present

## 2021-06-28 DIAGNOSIS — Z86718 Personal history of other venous thrombosis and embolism: Secondary | ICD-10-CM | POA: Diagnosis not present

## 2021-06-28 DIAGNOSIS — Z7901 Long term (current) use of anticoagulants: Secondary | ICD-10-CM | POA: Diagnosis not present

## 2021-06-28 DIAGNOSIS — Z08 Encounter for follow-up examination after completed treatment for malignant neoplasm: Secondary | ICD-10-CM | POA: Diagnosis not present

## 2021-06-28 DIAGNOSIS — Z9049 Acquired absence of other specified parts of digestive tract: Secondary | ICD-10-CM | POA: Diagnosis not present

## 2021-06-28 DIAGNOSIS — R918 Other nonspecific abnormal finding of lung field: Secondary | ICD-10-CM | POA: Diagnosis not present

## 2021-06-28 DIAGNOSIS — Z902 Acquired absence of lung [part of]: Secondary | ICD-10-CM | POA: Diagnosis not present

## 2021-07-16 DIAGNOSIS — J301 Allergic rhinitis due to pollen: Secondary | ICD-10-CM | POA: Diagnosis not present

## 2021-07-17 DIAGNOSIS — D485 Neoplasm of uncertain behavior of skin: Secondary | ICD-10-CM | POA: Diagnosis not present

## 2021-07-17 DIAGNOSIS — L439 Lichen planus, unspecified: Secondary | ICD-10-CM | POA: Diagnosis not present

## 2021-07-23 DIAGNOSIS — J301 Allergic rhinitis due to pollen: Secondary | ICD-10-CM | POA: Diagnosis not present

## 2021-07-30 DIAGNOSIS — J301 Allergic rhinitis due to pollen: Secondary | ICD-10-CM | POA: Diagnosis not present

## 2021-08-06 DIAGNOSIS — J301 Allergic rhinitis due to pollen: Secondary | ICD-10-CM | POA: Diagnosis not present

## 2021-08-09 DIAGNOSIS — H401221 Low-tension glaucoma, left eye, mild stage: Secondary | ICD-10-CM | POA: Diagnosis not present

## 2021-08-09 DIAGNOSIS — H401213 Low-tension glaucoma, right eye, severe stage: Secondary | ICD-10-CM | POA: Diagnosis not present

## 2021-08-13 DIAGNOSIS — J301 Allergic rhinitis due to pollen: Secondary | ICD-10-CM | POA: Diagnosis not present

## 2021-08-14 DIAGNOSIS — J301 Allergic rhinitis due to pollen: Secondary | ICD-10-CM | POA: Diagnosis not present

## 2021-08-20 DIAGNOSIS — J301 Allergic rhinitis due to pollen: Secondary | ICD-10-CM | POA: Diagnosis not present

## 2021-08-27 DIAGNOSIS — J301 Allergic rhinitis due to pollen: Secondary | ICD-10-CM | POA: Diagnosis not present

## 2021-08-30 ENCOUNTER — Other Ambulatory Visit: Payer: Self-pay

## 2021-08-30 ENCOUNTER — Encounter: Payer: Self-pay | Admitting: Family Medicine

## 2021-08-30 ENCOUNTER — Ambulatory Visit (INDEPENDENT_AMBULATORY_CARE_PROVIDER_SITE_OTHER): Payer: Medicare Other | Admitting: Family Medicine

## 2021-08-30 VITALS — BP 127/69 | HR 72 | Temp 98.3°F | Resp 16 | Ht 64.0 in | Wt 119.1 lb

## 2021-08-30 DIAGNOSIS — Z1211 Encounter for screening for malignant neoplasm of colon: Secondary | ICD-10-CM | POA: Diagnosis not present

## 2021-08-30 DIAGNOSIS — R7303 Prediabetes: Secondary | ICD-10-CM | POA: Diagnosis not present

## 2021-08-30 DIAGNOSIS — D649 Anemia, unspecified: Secondary | ICD-10-CM | POA: Diagnosis not present

## 2021-08-30 DIAGNOSIS — F4322 Adjustment disorder with anxiety: Secondary | ICD-10-CM

## 2021-08-30 DIAGNOSIS — Z0000 Encounter for general adult medical examination without abnormal findings: Secondary | ICD-10-CM

## 2021-08-30 DIAGNOSIS — M810 Age-related osteoporosis without current pathological fracture: Secondary | ICD-10-CM

## 2021-08-30 DIAGNOSIS — E7800 Pure hypercholesterolemia, unspecified: Secondary | ICD-10-CM | POA: Diagnosis not present

## 2021-08-30 DIAGNOSIS — E559 Vitamin D deficiency, unspecified: Secondary | ICD-10-CM | POA: Diagnosis not present

## 2021-08-30 DIAGNOSIS — C641 Malignant neoplasm of right kidney, except renal pelvis: Secondary | ICD-10-CM

## 2021-08-30 NOTE — Progress Notes (Signed)
Blood thinner clearance

## 2021-08-30 NOTE — Assessment & Plan Note (Signed)
Recommend low carb diet °Recheck A1c  °

## 2021-08-30 NOTE — Assessment & Plan Note (Signed)
Previously well controlled Continue statin Repeat FLP and CMP  

## 2021-08-30 NOTE — Progress Notes (Signed)
Gastroenterology Pre-Procedure Review  Request Date: 09/25/2021 Requesting Physician: Dr. Marius Ditch  PATIENT REVIEW QUESTIONS: The patient responded to the following health history questions as indicated:    1. Are you having any GI issues?  Sometimes, diarrhea with certain foods. 2. Do you have a personal history of Polyps? no 3. Do you have a family history of Colon Cancer or Polyps? yes (daughter- polyps) 4. Diabetes Mellitus? no 5. Joint replacements in the past 12 months?no 6. Major health problems in the past 3 months?no 7. Any artificial heart valves, MVP, or defibrillator?no    MEDICATIONS & ALLERGIES:    Patient reports the following regarding taking any anticoagulation/antiplatelet therapy:   Plavix, Coumadin, Eliquis, Xarelto, Lovenox, Pradaxa, Brilinta, or Effient? yes (Eliquis 5 mg) Aspirin? no  Patient confirms/reports the following medications:  Current Outpatient Medications  Medication Sig Dispense Refill   acetaminophen (TYLENOL) 325 MG tablet Take 650 mg by mouth every 6 (six) hours as needed.     B COMPLEX-C PO Take 1 tablet by mouth daily.      Calcium Carbonate-Vitamin D 600-200 MG-UNIT CAPS Take 2 capsules by mouth daily.      Cholecalciferol 1000 UNITS capsule Take 1,000 Units by mouth daily.      ELIQUIS 5 MG TABS tablet TAKE 1 TABLET BY MOUTH TWICE A DAY 180 tablet 2   fluticasone (FLONASE) 50 MCG/ACT nasal spray Place into both nostrils.     latanoprost (XALATAN) 0.005 % ophthalmic solution SMARTSIG:In Eye(s)     mometasone (ELOCON) 0.1 % lotion Apply topically.     MULTIPLE VITAMIN PO Take 1 tablet by mouth daily.      Omega-3 Fatty Acids (FISH OIL) 1200 MG CAPS Take 1 capsule by mouth daily.      psyllium (REGULOID) 0.52 G capsule Take 1 capsule by mouth 3 (three) times daily.      sertraline (ZOLOFT) 50 MG tablet TAKE 1 TABLET BY MOUTH ONCE A DAY 90 tablet 1   simvastatin (ZOCOR) 10 MG tablet TAKE ONE TABLET BY MOUTH AT BEDTIME 90 tablet 1   No current  facility-administered medications for this visit.    Patient confirms/reports the following allergies:  Allergies  Allergen Reactions   Cefuroxime Diarrhea    No orders of the defined types were placed in this encounter.   AUTHORIZATION INFORMATION Primary Insurance: 1D#: Group #:  Secondary Insurance: 1D#: Group #:  SCHEDULE INFORMATION: Date: 09/25/2021 Time: Location: [place]

## 2021-08-30 NOTE — Patient Instructions (Signed)
TDAP (tetanus) due in May - check with your pharmacy

## 2021-08-30 NOTE — Assessment & Plan Note (Signed)
F/b oncology

## 2021-08-30 NOTE — Progress Notes (Signed)
Annual Wellness Visit     Patient: Rita Webb, Female    DOB: 02/08/1946, 76 y.o.   MRN: [id number] Visit Date: 08/30/2021  Today's Provider: Lavon Paganini, Webb   Chief Complaint  Patient presents with   Medicare Wellness   I,Rita Webb,acting as a scribe for Rita Webb.,have documented all relevant documentation on the behalf of Rita Webb,as directed by  Rita Webb while in the presence of Rita Webb.  Subjective    Rita Webb is a 76 y.o. female who presents today for her Annual Wellness Visit. She reports consuming a  low carb  diet. Home exercise routine includes walking 2 hrs per week. She generally feels well. She reports sleeping well. She does have additional problems to discuss today.   HPI    Medications: Outpatient Medications Prior to Visit  Medication Sig   acetaminophen (TYLENOL) 325 MG tablet Take 650 mg by mouth every 6 (six) hours as needed.   B COMPLEX-C PO Take 1 tablet by mouth daily.    Calcium Carbonate-Vitamin D 600-200 MG-UNIT CAPS Take 2 capsules by mouth daily.    Cholecalciferol 1000 UNITS capsule Take 1,000 Units by mouth daily.    ELIQUIS 5 MG TABS tablet TAKE 1 TABLET BY MOUTH TWICE A DAY   fluticasone (FLONASE) 50 MCG/ACT nasal spray Place into both nostrils.   latanoprost (XALATAN) 0.005 % ophthalmic solution SMARTSIG:In Eye(s)   mometasone (ELOCON) 0.1 % lotion Apply topically.   MULTIPLE VITAMIN PO Take 1 tablet by mouth daily.    Omega-3 Fatty Acids (FISH OIL) 1200 MG CAPS Take 1 capsule by mouth daily.    psyllium (REGULOID) 0.52 G capsule Take 1 capsule by mouth 3 (three) times daily.    sertraline (ZOLOFT) 50 MG tablet TAKE 1 TABLET BY MOUTH ONCE A DAY   simvastatin (ZOCOR) 10 MG tablet TAKE ONE TABLET BY MOUTH AT BEDTIME   [DISCONTINUED] Cetirizine HCl 10 MG CAPS Take 1 capsule by mouth daily.  (Patient not taking: Reported on 08/30/2021)   [DISCONTINUED]  clonazePAM (KLONOPIN) 0.5 MG tablet Take 0.5 mg by mouth 2 (two) times daily as needed for anxiety.   No facility-administered medications prior to visit.    Allergies  Allergen Reactions   Cefuroxime Diarrhea    Patient Care Team: Rita Crews, Webb as PCP - General (Family Medicine) Rita Webb (Optometry) Rita Royals, Webb as Consulting Physician (Ophthalmology) Rita Milo, Rockney Ghee, Webb as Referring Physician (Hematology and Oncology) Rita Gaul, Webb as Referring Physician (Urology) Rita Cowboy Erskine Squibb, Webb as Referring Physician (Vascular Surgery) Rita Gust, Webb (Otolaryngology) Dasher, Rayvon Char, Webb (Dermatology)  Review of Systems  Constitutional: Negative.   HENT:  Positive for congestion, postnasal drip, rhinorrhea and sneezing.   Eyes:  Positive for itching.  Respiratory:  Positive for cough.   Gastrointestinal:  Positive for diarrhea.  Musculoskeletal:  Positive for back pain.  Allergic/Immunologic: Positive for environmental allergies.  Hematological:  Bruises/bleeds easily.  Psychiatric/Behavioral:  The patient is nervous/anxious.    Last CBC Lab Results  Component Value Date   WBC 6.0 08/28/2020   HGB 13.4 08/28/2020   HCT 40.4 08/28/2020   MCV 93 08/28/2020   MCH 30.7 08/28/2020   RDW 12.5 08/28/2020   PLT 289 33/54/5625   Last metabolic panel Lab Results  Component Value Date   GLUCOSE 99 08/28/2020   NA 143 08/28/2020   K 4.7 08/28/2020   CL 104 08/28/2020  CO2 23 08/28/2020   BUN 14 08/28/2020   CREATININE 0.97 08/28/2020   GFRNONAA 59 (L) 08/28/2020   CALCIUM 9.1 08/28/2020   PROT 7.1 08/28/2020   ALBUMIN 4.7 08/28/2020   LABGLOB 2.4 08/28/2020   AGRATIO 2.0 08/28/2020   BILITOT 0.3 08/28/2020   ALKPHOS 85 08/28/2020   AST 21 08/28/2020   ALT 14 08/28/2020   ANIONGAP 10 10/15/2019   Last lipids Lab Results  Component Value Date   CHOL 171 08/28/2020   HDL 63 08/28/2020   LDLCALC 95 08/28/2020   TRIG 69  08/28/2020   CHOLHDL 3.1 02/18/2019   Last hemoglobin A1c Lab Results  Component Value Date   HGBA1C 5.7 (H) 08/28/2020   Last thyroid functions Lab Results  Component Value Date   TSH 1.99 01/10/2014   Last vitamin D Lab Results  Component Value Date   VD25OH 63.4 08/28/2020        Objective    Vitals: BP 127/69 (BP Location: Right Arm, Patient Position: Sitting, Cuff Size: Normal)    Pulse 72    Temp 98.3 F (36.8 C) (Temporal)    Resp 16    Ht 5\' 4"  (1.626 m)    Wt 119 lb 1.6 oz (54 kg)    BMI 20.44 kg/m  BP Readings from Last 3 Encounters:  08/30/21 127/69  09/26/20 138/71  08/31/20 112/60   Wt Readings from Last 3 Encounters:  08/30/21 119 lb 1.6 oz (54 kg)  09/26/20 130 lb 3.2 oz (59.1 kg)  08/31/20 131 lb (59.4 kg)      Physical Exam Vitals reviewed.  Constitutional:      General: She is not in acute distress.    Appearance: Normal appearance. She is well-developed. She is not diaphoretic.  HENT:     Head: Normocephalic and atraumatic.  Eyes:     General: No scleral icterus.    Conjunctiva/sclera: Conjunctivae normal.  Neck:     Thyroid: No thyromegaly.  Cardiovascular:     Rate and Rhythm: Normal rate and regular rhythm.     Pulses: Normal pulses.     Heart sounds: Normal heart sounds. No murmur heard. Pulmonary:     Effort: Pulmonary effort is normal. No respiratory distress.     Breath sounds: Normal breath sounds. No wheezing, rhonchi or rales.  Musculoskeletal:     Cervical back: Neck supple.     Right lower leg: No edema.     Left lower leg: No edema.  Lymphadenopathy:     Cervical: No cervical adenopathy.  Skin:    General: Skin is warm and dry.     Findings: No rash.  Neurological:     Mental Status: She is alert and oriented to person, place, and time. Mental status is at baseline.  Psychiatric:        Mood and Affect: Mood normal.        Behavior: Behavior normal.     Most recent functional status assessment: In your present  state of health, do you have any difficulty performing the following activities: 08/30/2021  Hearing? N  Vision? N  Difficulty concentrating or making decisions? N  Walking or climbing stairs? N  Dressing or bathing? N  Doing errands, shopping? N  Some recent data might be hidden   Most recent fall risk assessment: Fall Risk  08/30/2021  Falls in the past year? 0  Number falls in past yr: 0  Injury with Fall? 0  Risk for fall due to :  No Fall Risks  Follow up Falls evaluation completed    Most recent depression screenings: PHQ 2/9 Scores 08/30/2021 08/28/2020  PHQ - 2 Score 0 0  PHQ- 9 Score 2 0   Most recent cognitive screening: 6CIT Screen 08/30/2021  What Year? 0 points  What month? 0 points  What time? 0 points  Count back from 20 0 points  Months in reverse 0 points  Repeat phrase 0 points  Total Score 0   Most recent Audit-C alcohol use screening Alcohol Use Disorder Test (AUDIT) 08/30/2021  1. How often do you have a drink containing alcohol? 0  2. How many drinks containing alcohol do you have on a typical day when you are drinking? 0  3. How often do you have six or more drinks on one occasion? 0  AUDIT-C Score 0  Alcohol Brief Interventions/Follow-up -   A score of 3 or more in women, and 4 or more in men indicates increased risk for alcohol abuse, EXCEPT if all of the points are from question 1   No results found for any visits on 08/30/21.  Assessment & Plan     Annual wellness visit done today including the all of the following: Reviewed patient's Family Medical History Reviewed and updated list of patient's medical providers Assessment of cognitive impairment was done Assessed patient's functional ability Established a written schedule for health screening [place] Completed and Reviewed  Exercise Activities and Dietary recommendations  Goals      Increase physical activity        Immunization History  Administered Date(s)  Administered   Fluad Quad(high Dose 65+) 04/13/2021   Influenza Split 07/21/2012   Influenza, High Dose Seasonal PF 04/14/2014, 05/11/2015, 05/04/2016, 05/08/2017, 04/25/2018   Influenza,inj,Quad PF,6+ Mos 04/15/2013   Influenza-Unspecified 04/19/2020, 05/16/2021   PFIZER(Purple Top)SARS-COV-2 Vaccination 12/14/2019, 01/06/2020, 07/14/2020   Pfizer Covid-19 Vaccine Bivalent Booster 43yrs & up 05/18/2021   Pneumococcal Conjugate-13 12/29/2013   Pneumococcal Polysaccharide-23 12/28/2012   Tdap 11/27/2011   Zoster Recombinat (Shingrix) 03/05/2018, 06/11/2018   Zoster, Live 01/23/2011    Health Maintenance  Topic Date Due   DEXA SCAN  03/16/2021   TETANUS/TDAP  11/26/2021   COLONOSCOPY (Pts 45-71yrs Insurance coverage will need to be confirmed)  01/20/2022   MAMMOGRAM  05/09/2023   Pneumonia Vaccine 53+ Years old  Completed   INFLUENZA VACCINE  Completed   COVID-19 Vaccine  Completed   Hepatitis C Screening  Completed   Zoster Vaccines- Shingrix  Completed   HPV VACCINES  Aged Out     Discussed health benefits of physical activity, and encouraged her to engage in regular exercise appropriate for her age and condition.    Problem List Items Addressed This Visit       Musculoskeletal and Integument   Osteoporosis   Relevant Orders   DG Bone Density     Genitourinary   Renal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, right ([place])    F/b oncology        Other   Hypercholesteremia    Previously well controlled Continue statin Repeat FLP and CMP      Relevant Orders   Comprehensive metabolic panel   Lipid Panel With LDL/HDL Ratio   Avitaminosis D   Relevant Orders   VITAMIN D 25 Hydroxy (Vit-D Deficiency, Fractures)   Adjustment disorder with anxiety    Well controlled Continue low dose zoloft      Prediabetes    Recommend low carb diet Recheck A1c  Relevant Orders   Hemoglobin A1c   Other Visit Diagnoses     Encounter for Medicare annual wellness exam    -  Primary    Relevant Orders   Comprehensive metabolic panel   Encounter for annual physical exam       Anemia, unspecified type       Relevant Orders   CBC with Differential/Platelet   Colon cancer screening       Relevant Orders   Ambulatory referral to Gastroenterology        Return in about 1 year (around 08/30/2022) for AWV, CPE.     I, Rita Webb, have reviewed all documentation for this visit. The documentation on 08/30/21 for the exam, diagnosis, procedures, and orders are all accurate and complete.   Diesel Lina, Rita Bucy, Webb, MPH [facility]

## 2021-08-30 NOTE — Assessment & Plan Note (Signed)
Well controlled Continue low dose zoloft

## 2021-08-31 ENCOUNTER — Telehealth: Payer: Self-pay

## 2021-08-31 LAB — COMPREHENSIVE METABOLIC PANEL
ALT: 18 IU/L (ref 0–32)
AST: 25 IU/L (ref 0–40)
Albumin/Globulin Ratio: 2 (ref 1.2–2.2)
Albumin: 4.5 g/dL (ref 3.7–4.7)
Alkaline Phosphatase: 79 IU/L (ref 44–121)
BUN/Creatinine Ratio: 17 (ref 12–28)
BUN: 15 mg/dL (ref 8–27)
Bilirubin Total: 0.4 mg/dL (ref 0.0–1.2)
CO2: 23 mmol/L (ref 20–29)
Calcium: 9.4 mg/dL (ref 8.7–10.3)
Chloride: 103 mmol/L (ref 96–106)
Creatinine, Ser: 0.87 mg/dL (ref 0.57–1.00)
Globulin, Total: 2.2 g/dL (ref 1.5–4.5)
Glucose: 96 mg/dL (ref 70–99)
Potassium: 4.6 mmol/L (ref 3.5–5.2)
Sodium: 140 mmol/L (ref 134–144)
Total Protein: 6.7 g/dL (ref 6.0–8.5)
eGFR: 70 mL/min/{1.73_m2} (ref >59)

## 2021-08-31 LAB — CBC WITH DIFFERENTIAL/PLATELET
Basophils Absolute: 0 10*3/uL (ref 0.0–0.2)
Basos: 1 %
EOS (ABSOLUTE): 0.1 10*3/uL (ref 0.0–0.4)
Eos: 1 %
Hematocrit: 36.3 % (ref 34.0–46.6)
Hemoglobin: 12.4 g/dL (ref 11.1–15.9)
Immature Grans (Abs): 0 10*3/uL (ref 0.0–0.1)
Immature Granulocytes: 0 %
Lymphocytes Absolute: 2.1 10*3/uL (ref 0.7–3.1)
Lymphs: 37 %
MCH: 30.7 pg (ref 26.6–33.0)
MCHC: 34.2 g/dL (ref 31.5–35.7)
MCV: 90 fL (ref 79–97)
Monocytes Absolute: 0.4 10*3/uL (ref 0.1–0.9)
Monocytes: 6 %
Neutrophils Absolute: 3.2 10*3/uL (ref 1.4–7.0)
Neutrophils: 55 %
Platelets: 296 10*3/uL (ref 150–450)
RBC: 4.04 x10E6/uL (ref 3.77–5.28)
RDW: 12.8 % (ref 11.7–15.4)
WBC: 5.8 10*3/uL (ref 3.4–10.8)

## 2021-08-31 LAB — LIPID PANEL WITH LDL/HDL RATIO
Cholesterol, Total: 155 mg/dL (ref 100–199)
HDL: 58 mg/dL (ref >39)
LDL Chol Calc (NIH): 85 mg/dL (ref 0–99)
LDL/HDL Ratio: 1.5 ratio (ref 0.0–3.2)
Triglycerides: 60 mg/dL (ref 0–149)
VLDL Cholesterol Cal: 12 mg/dL (ref 5–40)

## 2021-08-31 LAB — HEMOGLOBIN A1C
Est. average glucose Bld gHb Est-mCnc: 117 mg/dL
Hgb A1c MFr Bld: 5.7 % — ABNORMAL HIGH (ref 4.8–5.6)

## 2021-08-31 LAB — VITAMIN D 25 HYDROXY (VIT D DEFICIENCY, FRACTURES): Vit D, 25-Hydroxy: 62.6 ng/mL (ref 30.0–100.0)

## 2021-08-31 NOTE — Telephone Encounter (Signed)
Patient has been informed that she may stop her Eliquis 2 days prior to procedure. She is to restart 1 day after per Dr. Brita Romp. Pt verbalized understanding. Clearance will be scanned into media.

## 2021-09-03 DIAGNOSIS — J301 Allergic rhinitis due to pollen: Secondary | ICD-10-CM | POA: Diagnosis not present

## 2021-09-10 DIAGNOSIS — J301 Allergic rhinitis due to pollen: Secondary | ICD-10-CM | POA: Diagnosis not present

## 2021-09-17 DIAGNOSIS — J301 Allergic rhinitis due to pollen: Secondary | ICD-10-CM | POA: Diagnosis not present

## 2021-09-24 DIAGNOSIS — J301 Allergic rhinitis due to pollen: Secondary | ICD-10-CM | POA: Diagnosis not present

## 2021-09-25 ENCOUNTER — Ambulatory Visit: Payer: Medicare Other | Admitting: Anesthesiology

## 2021-09-25 ENCOUNTER — Encounter
Admission: RE | Disposition: A | Discharge status: Home / Self Care | Payer: Self-pay | Source: Home / Self Care | Attending: Gastroenterology

## 2021-09-25 ENCOUNTER — Ambulatory Visit
Admission: RE | Admit: 2021-09-25 | Discharge: 2021-09-25 | Disposition: A | Discharge status: Home / Self Care | Payer: Medicare Other | Attending: Gastroenterology | Admitting: Gastroenterology

## 2021-09-25 ENCOUNTER — Other Ambulatory Visit: Payer: Self-pay

## 2021-09-25 ENCOUNTER — Encounter: Payer: Self-pay | Admitting: Gastroenterology

## 2021-09-25 DIAGNOSIS — K635 Polyp of colon: Secondary | ICD-10-CM | POA: Insufficient documentation

## 2021-09-25 DIAGNOSIS — Z87891 Personal history of nicotine dependence: Secondary | ICD-10-CM | POA: Insufficient documentation

## 2021-09-25 DIAGNOSIS — Z1211 Encounter for screening for malignant neoplasm of colon: Secondary | ICD-10-CM | POA: Diagnosis not present

## 2021-09-25 DIAGNOSIS — K5730 Diverticulosis of large intestine without perforation or abscess without bleeding: Secondary | ICD-10-CM | POA: Diagnosis not present

## 2021-09-25 DIAGNOSIS — Z905 Acquired absence of kidney: Secondary | ICD-10-CM | POA: Insufficient documentation

## 2021-09-25 HISTORY — PX: COLONOSCOPY WITH PROPOFOL: SHX5780

## 2021-09-25 SURGERY — COLONOSCOPY WITH PROPOFOL
Anesthesia: General

## 2021-09-25 MED ORDER — PROPOFOL 10 MG/ML IV BOLUS
INTRAVENOUS | Status: DC | PRN
  Administered 2021-09-25: 30 mg via INTRAVENOUS
  Administered 2021-09-25: 20 mg via INTRAVENOUS

## 2021-09-25 MED ORDER — PROPOFOL 500 MG/50ML IV EMUL
INTRAVENOUS | Status: DC | PRN
  Administered 2021-09-25: 145 ug/kg/min via INTRAVENOUS

## 2021-09-25 MED ORDER — LIDOCAINE HCL (CARDIAC) PF 100 MG/5ML IV SOSY
PREFILLED_SYRINGE | INTRAVENOUS | Status: DC | PRN
  Administered 2021-09-25: 80 mg via INTRAVENOUS
  Administered 2021-09-25: 20 mg via INTRAVENOUS

## 2021-09-25 MED ORDER — SODIUM CHLORIDE 0.9 % IV SOLN: INTRAVENOUS | Status: DC

## 2021-09-25 NOTE — Op Note (Signed)
[facility] ?Gastroenterology ?Patient Name: Rita Webb ?Procedure Date: 09/25/2021 10:23 AM ?MRN: [id number] ?Account #: [id number] ?Date of Birth: [birth year] ?Admit Type: Outpatient ?Age: 76 ?Room: [facility] ENDO ROOM 3 ?Gender: Female ?Note Status: Finalized ?Instrument Name: Peds Colonoscope [id number] ?Procedure:             Colonoscopy ?Indications:           Screening for colorectal malignant neoplasm, Last  ?                       colonoscopy: May 2002, Last colonoscopy 10 years ago ?Providers:             Lin Landsman MD, MD ?Referring MD:          Dionne Bucy. Bacigalupo (Referring MD) ?Medicines:             General Anesthesia ?Complications:         No immediate complications. Estimated blood loss: None. ?Procedure:             Pre-Anesthesia Assessment: ?                       - Prior to the procedure, a History and Physical was  ?                       performed, and patient medications and allergies were  ?                       reviewed. The patient is competent. The risks and  ?                       benefits of the procedure and the sedation options and  ?                       risks were discussed with the patient. All questions  ?                       were answered and informed consent was obtained.  ?                       Patient identification and proposed procedure were  ?                       verified by the physician, the nurse, the  ?                       anesthesiologist, the anesthetist and the technician  ?                       in the pre-procedure area in the procedure room in the  ?                       endoscopy suite. Mental Status Examination: alert and  ?                       oriented. Airway Examination: normal oropharyngeal  ?                       airway and neck mobility. Respiratory Examination:  ?  clear to auscultation. CV Examination: normal.  ?                       Prophylactic Antibiotics: The patient does not require  ?                        prophylactic antibiotics. Prior Anticoagulants: The  ?                       patient has taken no previous anticoagulant or  ?                       antiplatelet agents. ASA Grade Assessment: II - A  ?                       patient with mild systemic disease. After reviewing  ?                       the risks and benefits, the patient was deemed in  ?                       satisfactory condition to undergo the procedure. The  ?                       anesthesia plan was to use general anesthesia.  ?                       Immediately prior to administration of medications,  ?                       the patient was re-assessed for adequacy to receive  ?                       sedatives. The heart rate, respiratory rate, oxygen  ?                       saturations, blood pressure, adequacy of pulmonary  ?                       ventilation, and response to care were monitored  ?                       throughout the procedure. The physical status of the  ?                       patient was re-assessed after the procedure. ?                       After obtaining informed consent, the colonoscope was  ?                       passed under direct vision. Throughout the procedure,  ?                       the patient's blood pressure, pulse, and oxygen  ?                       saturations were monitored continuously. The  ?  colonoscopy was performed without difficulty. The  ?                       patient tolerated the procedure well. The Colonoscope  ?                       was introduced through the anus and advanced to the  ?                       the cecum, identified by appendiceal orifice and  ?                       ileocecal valve. The quality of the bowel preparation  ?                       was evaluated using the BBPS [facility] Bowel Preparation  ?                       Scale) with scores of: Right Colon = 3, Transverse  ?                       Colon = 3 and Left Colon = 3 (entire  mucosa seen well  ?                       with no residual staining, small fragments of stool or  ?                       opaque liquid). The total BBPS score equals 9. ?Findings: ?     The perianal and digital rectal examinations were normal. Pertinent  ?     negatives include normal sphincter tone and no palpable rectal lesions. ?     A 5 mm polyp was found in the descending colon. The polyp was sessile.  ?     The polyp was removed with a cold snare. Resection and retrieval were  ?     complete. Estimated blood loss: none. ?     Multiple diverticula were found in the recto-sigmoid colon and sigmoid  ?     colon. ?     The retroflexed view of the distal rectum and anal verge was normal and  ?     showed no anal or rectal abnormalities. ?Impression:            - One 5 mm polyp in the descending colon, removed with  ?                       a cold snare. Resected and retrieved. ?                       - Diverticulosis in the recto-sigmoid colon and in the  ?                       sigmoid colon. ?                       - The distal rectum and anal verge are normal on  ?                       retroflexion  view. ?Recommendation:        - Discharge patient to home (with escort). ?                       - Resume previous diet today. ?                       - Continue present medications. ?                       - Await pathology results. ?                       - Repeat colonoscopy in 7 years for surveillance based  ?                       on pathology results. ?Procedure Code(s):     --- Professional --- ?                       [phone], Colonoscopy, flexible; with removal of  ?                       tumor(s), polyp(s), or other lesion(s) by snare  ?                       technique ?Diagnosis Code(s):     --- Professional --- ?                       K63.5, Polyp of colon ?                       Z12.11, Encounter for screening for malignant neoplasm  ?                       of colon ?                       K57.30,  Diverticulosis of large intestine without  ?                       perforation or abscess without bleeding ?CPT copyright 2019 American Medical Association. All rights reserved. ?The codes documented in this report are preliminary and upon coder review may  ?be revised to meet current compliance requirements. ?Dr. Ulyess Mort ?Arturo Freundlich Raeanne Gathers MD, MD ?09/25/2021 10:53:40 AM ?This report has been signed electronically. ?Number of Addenda: 0 ?Note Initiated On: 09/25/2021 10:23 AM ?Scope Withdrawal Time: 0 hours 13 minutes 13 seconds  ?Total Procedure Duration: 0 hours 21 minutes 39 seconds  ?Estimated Blood Loss:  Estimated blood loss: none. ?     [address] [facility] ?

## 2021-09-25 NOTE — Anesthesia Preprocedure Evaluation (Addendum)
Anesthesia Evaluation  ?Patient identified by MRN, date of birth, ID band ?Patient awake ? ? ? ?Reviewed: ?Allergy & Precautions, NPO status , Patient's Chart, lab work & pertinent test results ? ?Airway ?Mallampati: III ? ?TM Distance: >3 FB ?Neck ROM: full ? ? ? Dental ? ?(+) Chipped ?  ?Pulmonary ?neg pulmonary ROS, former smoker,  ?  ?Pulmonary exam normal ? ? ? ? ? ? ? Cardiovascular ?Normal cardiovascular exam ? 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  ?Neuro/Psych ?PSYCHIATRIC DISORDERS negative neurological ROS ?   ? GI/Hepatic ?negative GI ROS, Neg liver ROS,   ?Endo/Other  ?negative endocrine ROS ? Renal/GU ?Renal disease (s/p nephrectomy)  ?negative genitourinary ?  ?Musculoskeletal ? ? Abdominal ?  ?Peds ? 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 ?Past Surgical History: ?No date: ADRENALECTOMY ?No date: CHOLECYSTECTOMY ?05/2020: EYE SURGERY; Left ?1996: GANGLION CYST EXCISION; Right ?    Comment:  wrist ?08/31/2020: IVC FILTER REMOVAL; N/A ?    Comment:  Procedure: IVC FILTER REMOVAL;  Surgeon: Algernon Huxley,  ?             MD;  Location: [facility];  Service:  ?             Cardiovascular;  Laterality: N/A; ?No date: LAPAROSCOPIC NEPHRECTOMY ?02/05/2021: LUNG CANCER SURGERY; Right ?    Comment:  [facility] Dr. Elenor Quinones ?08/2011: MELANOMA EXCISION ?10/14/2019: PERIPHERAL VASCULAR THROMBECTOMY; Left ?    Comment:  Procedure: LEFT LOWER EXTREMITY VENOUS LYSIS /  ?             THROMBECTOMY WITH IVF FILTER;  Surgeon: Algernon Huxley, MD; ?             Location: [facility] [facility];  Service: Cardiovascular; ?             Laterality: Left; ?No date: SPINE SURGERY ? ? ? ? Reproductive/Obstetrics ?negative OB ROS ? ?  ? ? ? ? ? ? ? ? ? ? ? ? ? ?  ?  ? ? ? ? ? ? ? ? ?Anesthesia Physical ?Anesthesia Plan ? ?ASA:  2 ? ?Anesthesia Plan: General  ? ?Post-op Pain Management:   ? ?Induction:  ? ?PONV Risk Score and Plan: Propofol infusion and TIVA ? ?Airway Management Planned:  ? ?Additional Equipment:  ? ?Intra-op Plan:  ? ?Post-operative Plan:  ? ?Informed Consent:  ? ?Plan Discussed with: Anesthesiologist, CRNA and Surgeon ? ?Anesthesia Plan Comments:   ? ? ? ? ? ? ?Anesthesia Quick Evaluation ? ?

## 2021-09-25 NOTE — H&P (Signed)
?Rita Darby, MD ?[address]  ?[address]  ?[place], [place] [address]  ?Main: [phone]  ?Fax: [phone] ?Pager: [phone] ? ?Primary Care Physician:  Rita Crews, MD ?Primary Gastroenterologist:  Dr. Cephas Webb ? ?Pre-Procedure History & Physical: ?HPI:  Rita Webb is a 76 y.o. female is here for an colonoscopy. ?  ?Past Medical History:  ?Diagnosis Date  ? Allergy   ? 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 Hyperlipidemia   ? Renal mass   ? ? ?Past Surgical History:  ?Procedure Laterality Date  ? ADRENALECTOMY    ? CHOLECYSTECTOMY    ? EYE SURGERY Left 05/2020  ? GANGLION CYST EXCISION Right 1996  ? wrist  ? IVC FILTER REMOVAL N/A 08/31/2020  ? Procedure: IVC FILTER REMOVAL;  Surgeon: Algernon Huxley, MD;  Location: [place];  Service: Cardiovascular;  Laterality: N/A;  ? LAPAROSCOPIC NEPHRECTOMY    ? LUNG CANCER SURGERY Right 02/05/2021  ? [facility] Dr. Elen[facility] Webb  ? MELANOMA EXCISION  08/2011  ? PERIPHERAL VASCULAR THROMBECTOMY Left 10/14/2019  ? Procedure: LEFT LOWER EXTREMITY VENOUS LYSIS / THROMBECTOMY WITH IVF FILTER;  Surgeon: Algernon Huxley, MD;  Location: [facility];  Service: Cardiovascular;  Laterality: Left;  ? SPINE SURGERY    ? ? ?Prior to Admission medications   ?Medication Sig Start Date End Date Taking? Authorizing Provider  ?B COMPLEX-C PO Take 1 tablet by mouth daily.  08/23/10  Yes [provider]  ?Calcium Carbonate-Vitamin D 600-200 MG-UNIT CAPS Take 2 capsules by mouth daily.  08/23/10  Yes [provider]  ?Cholecalciferol 1000 UNITS capsule Take 1,000 Units by mouth daily.  08/23/10  Yes [provider]  ?fluticasone (FLONASE) 50 MCG/ACT nasal spray Place into both nostrils. 04/03/20  Yes [provider]  ?latanoprost (XALATAN) 0.005 % ophthalmic solution SMARTSIG:In Eye(s) 07/30/21  Yes [provider]  ?MULTIPLE VITAMIN PO Take 1 tablet by mouth daily.  08/23/10  Yes [provider]  ?Omega-3 Fatty Acids (FISH OIL) 1200 MG CAPS Take 1 capsule by mouth daily.  08/23/10  Yes [provider]  ?sertraline (ZOLOFT) 50 MG tablet TAKE 1 TABLET BY MOUTH ONCE A DAY 01/29/21  Yes Bacigalupo, Rita Bucy, MD  ?simvastatin (ZOCOR) 10 MG tablet TAKE ONE TABLET BY MOUTH AT BEDTIME 05/01/21  Yes Bacigalupo, Rita Bucy, MD  ?acetaminophen (TYLENOL) 325 MG tablet Take 650 mg by mouth every 6 (six) hours as needed.    [provider]  ?ELIQUIS 5 MG TABS tablet TAKE 1 TABLET BY MOUTH TWICE A DAY 09/08/20   Bacigalupo, Rita Bucy, MD  ?mometasone (ELOCON) 0.1 % lotion Apply topically. 02/23/20   [provider]  ?psyllium (REGULOID) 0.52 G capsule Take 1 capsule by mouth 3 (three) times daily.  08/23/10   [provider]  ? ? ?Allergies as of 08/30/2021 - Review Complete 08/30/2021  ?Allergen Reaction Noted  ? Cefuroxime Diarrhea 11/11/2014  ? ? ?Family History  ?Problem Relation Age of Onset  ? Breast cancer Mother 32  ? Prostate cancer Father   ? Diabetes Maternal Aunt   ? Diabetes Maternal Uncle   ? ? ?Social History  ? ?Socioeconomic History  ? Marital status: Married  ?  Spouse name: Not on file  ? Number of children: 2  ? Years of education: Not on file  ? Highest education level: Some college, no degree  ?Occupational History  ? Occupation: retired  ?  Tobacco Use  ? Smoking status: Former  ?  Types: Cigarettes  ?  Quit date: 11/10/1968  ?  Years since quitting: 52.9  ? Smokeless tobacco: Never  ?Vaping Use  ? Vaping Use: Never used  ?Substance and Sexual Activity  ? Alcohol use: No  ? Drug use: No  ? Sexual activity: Not on file  ?Other Topics Concern  ? Not on file  ?Social History Narrative  ? Not on file  ? ?Social Determinants of Health  ? ?Financial Resource Strain: Not on file  ?Food Insecurity: Not on file  ?Transportation Needs: Not on file  ?Physical Activity: Not on file  ?Stress: Not on file  ?Social Connections: Not on file  ?Intimate Partner Violence: Not on  file  ? ? ?Review of Systems: ?See HPI, otherwise negative ROS ? ?Physical Exam: ?BP (!) 149/65   Pulse (!) 111   Temp 97.7 ?F (36.5 ?C) (Temporal)   Resp 18   Ht 5\' 4"  (1.626 m)   Wt 54 kg   SpO2 97%   BMI 20.43 kg/m?  ?General:   Alert,  pleasant and cooperative in NAD ?Head:  Normocephalic and atraumatic. ?Neck:  Supple; no masses or thyromegaly. ?Lungs:  Clear throughout to auscultation.    ?Heart:  Regular rate and rhythm. ?Abdomen:  Soft, nontender and nondistended. Normal bowel sounds, without guarding, and without rebound.   ?Neurologic:  Alert and  oriented x4;  grossly normal neurologically. ? ?Impression/Plan: ?[name] is here for an colonoscopy to be performed for colon cancer screening ? ?Risks, benefits, limitations, and alternatives regarding  colonoscopy have been reviewed with the patient.  Questions have been answered.  All parties agreeable. ? ? ?Sherri Sear, MD  09/25/2021, 9:27 AM ?

## 2021-09-25 NOTE — Progress Notes (Signed)
Patient stated that she tolerated the 2nd portion of the Suprep poorly, vomiting what she thinks was the entire dose.  She was able to tolerated 32 oz of water after vomiting.  She stated that her stool is liquid with no solid pieces noted, but that it is still brown and she is unable to see to the bottom of the toilet.  Dr Marius Ditch informed and asked that the patient be given a tap water enema. ?Patient tolerated the enema without any difficulty and the return was clear with no solid stool noted and minimal sediment present in the toilet.  Dr Marius Ditch aware and will proceed with scheduled Colonoscopy. ?

## 2021-09-25 NOTE — Anesthesia Postprocedure Evaluation (Signed)
Anesthesia Post Note ? ?Patient: Affie Gasner ? ?Procedure(s) Performed: COLONOSCOPY WITH PROPOFOL ? ?Patient location during evaluation: Endoscopy ?Anesthesia Type: General ?Level of consciousness: awake and alert ?Pain management: pain level controlled ?Vital Signs Assessment: post-procedure vital signs reviewed and stable ?Respiratory status: spontaneous breathing, nonlabored ventilation and respiratory function stable ?Cardiovascular status: blood pressure returned to baseline and stable ?Postop Assessment: no apparent nausea or vomiting ?Anesthetic complications: no ? ? ?No notable events documented. ? ? ?Last Vitals:  ?Vitals:  ? 09/25/21 0857 09/25/21 1053  ?BP: (!) 149/65 97/63  ?Pulse: (!) 111 84  ?Resp: 18 17  ?Temp: 36.5 ?C (!) 36.4 ?C  ?SpO2: 97% 100%  ?  ?Last Pain:  ?Vitals:  ? 09/25/21 1113  ?TempSrc:   ?PainSc: 0-No pain  ? ? ?  ?  ?  ?  ?  ?  ? ?[place] Ouch ? ? ? ? ?

## 2021-09-25 NOTE — Transfer of Care (Signed)
Immediate Anesthesia Transfer of Care Note ? ?Patient: Rita Webb ? ?Procedure(s) Performed: COLONOSCOPY WITH PROPOFOL ? ?Patient Location: Endoscopy Unit ? ?Anesthesia Type:General ? ?Level of Consciousness: awake, drowsy and patient cooperative ? ?Airway & Oxygen Therapy: Patient Spontanous Breathing and Patient connected to face mask oxygen ? ?Post-op Assessment: Report given to RN and Post -op Vital signs reviewed and stable ? ?Post vital signs: Reviewed and stable ? ?Last Vitals:  ?Vitals Value Taken Time  ?BP    ?Temp    ?Pulse 86 09/25/21 1054  ?Resp 18 09/25/21 1054  ?SpO2 100 % 09/25/21 1054  ?Vitals shown include unvalidated device data. ? ?Last Pain:  ?Vitals:  ? 09/25/21 0857  ?TempSrc: Temporal  ?PainSc: 0-No pain  ?   ? ?  ? ?Complications: No notable events documented. ?

## 2021-09-26 ENCOUNTER — Encounter: Payer: Self-pay | Admitting: Gastroenterology

## 2021-09-26 LAB — SURGICAL PATHOLOGY

## 2021-09-27 ENCOUNTER — Encounter: Payer: Self-pay | Admitting: Gastroenterology

## 2021-10-01 DIAGNOSIS — J301 Allergic rhinitis due to pollen: Secondary | ICD-10-CM | POA: Diagnosis not present

## 2021-10-08 DIAGNOSIS — J301 Allergic rhinitis due to pollen: Secondary | ICD-10-CM | POA: Diagnosis not present

## 2021-10-11 DIAGNOSIS — Z7901 Long term (current) use of anticoagulants: Secondary | ICD-10-CM | POA: Diagnosis not present

## 2021-10-11 DIAGNOSIS — Z86718 Personal history of other venous thrombosis and embolism: Secondary | ICD-10-CM | POA: Diagnosis not present

## 2021-10-15 DIAGNOSIS — J301 Allergic rhinitis due to pollen: Secondary | ICD-10-CM | POA: Diagnosis not present

## 2021-10-22 DIAGNOSIS — J301 Allergic rhinitis due to pollen: Secondary | ICD-10-CM | POA: Diagnosis not present

## 2021-10-24 ENCOUNTER — Ambulatory Visit
Admission: RE | Admit: 2021-10-24 | Discharge: 2021-10-24 | Disposition: A | Discharge status: Home / Self Care | Payer: Medicare Other | Source: Ambulatory Visit | Attending: Family Medicine | Admitting: Family Medicine

## 2021-10-24 DIAGNOSIS — M810 Age-related osteoporosis without current pathological fracture: Secondary | ICD-10-CM | POA: Insufficient documentation

## 2021-10-25 ENCOUNTER — Other Ambulatory Visit: Payer: Self-pay | Admitting: Family Medicine

## 2021-10-25 ENCOUNTER — Telehealth: Payer: Self-pay

## 2021-10-25 DIAGNOSIS — E7800 Pure hypercholesterolemia, unspecified: Secondary | ICD-10-CM

## 2021-10-25 NOTE — Telephone Encounter (Signed)
-----   Message from Virginia Crews, MD sent at 10/25/2021 12:51 PM EDT ----- ?Osteoporosis continues to worsen. Recommend Endocrinology referral to discuss management options. ?

## 2021-10-25 NOTE — Telephone Encounter (Signed)
Patient advised and reports that she is going to reach out to Dr. B about some questions that she has but later. ?

## 2021-10-25 NOTE — Telephone Encounter (Signed)
Requested medications are due for refill today.  yes ? ?Requested medications are on the active medications list.  yes ? ?Last refill. 01/29/2021 #90 1 refill ? ?Future visit scheduled.   no ? ?Notes to clinic.  Medication refill is not delegated. ? ? ? ?Requested Prescriptions  ?Pending Prescriptions Disp Refills  ? sertraline (ZOLOFT) 50 MG tablet [Pharmacy Med Name: SERTRALINE HCL 50 MG TAB] 90 tablet 1  ?  Sig: TAKE 1 TABLET BY MOUTH ONCE A DAY  ?  ? Not Delegated - Psychiatry:  Antidepressants - SSRI - sertraline Failed - 10/25/2021 10:27 AM  ?  ?  Failed - This refill cannot be delegated  ?  ?  Passed - AST in normal range and within 360 days  ?  AST  ?Date Value Ref Range Status  ?08/30/2021 25 0 - 40 IU/L Final  ?  ?  ?  ?  Passed - ALT in normal range and within 360 days  ?  ALT  ?Date Value Ref Range Status  ?08/30/2021 18 0 - 32 IU/L Final  ?  ?  ?  ?  Passed - Completed PHQ-2 or PHQ-9 in the last 360 days  ?  ?  Passed - Valid encounter within last 6 months  ?  Recent Outpatient Visits   ? ?      ? 1 month ago Encounter for [employer] annual wellness exam  ? [facility] [place], Dionne Bucy, MD  ? 8 months ago COVID-19  ? [facility] Bacigalupo, Dionne Bucy, MD  ? 1 year ago Encounter for annual physical exam  ? [facility] [place], Dionne Bucy, MD  ? 1 year ago Adjustment disorder with anxiety  ? [facility] [place], Dionne Bucy, MD  ? 1 year ago Adjustment disorder with anxiety  ? [facility] Bacigalupo, Dionne Bucy, MD  ? ?  ?  ? ? ?  ?  ?  ?Signed Prescriptions Disp Refills  ? simvastatin (ZOCOR) 10 MG tablet 90 tablet 3  ?  Sig: TAKE ONE TABLET BY MOUTH AT BEDTIME  ?  ? Cardiovascular:  Antilipid - Statins Failed - 10/25/2021 10:27 AM  ?  ?  Failed - Lipid Panel in normal range within the last 12 months  ?  Cholesterol, Total  ?Date Value Ref Range Status  ?08/30/2021 155 100 - 199 mg/dL Final  ? ?LDL Chol Calc (NIH)  ?Date Value Ref  Range Status  ?08/30/2021 85 0 - 99 mg/dL Final  ? ?HDL  ?Date Value Ref Range Status  ?08/30/2021 58 >39 mg/dL Final  ? ?Triglycerides  ?Date Value Ref Range Status  ?08/30/2021 60 0 - 149 mg/dL Final  ? ?  ?  ?  Passed - Patient is not pregnant  ?  ?  Passed - Valid encounter within last 12 months  ?  Recent Outpatient Visits   ? ?      ? 1 month ago Encounter for [employer] annual wellness exam  ? [facility] [place], Dionne Bucy, MD  ? 8 months ago COVID-19  ? [facility] Bacigalupo, Dionne Bucy, MD  ? 1 year ago Encounter for annual physical exam  ? [facility] [place], Dionne Bucy, MD  ? 1 year ago Adjustment disorder with anxiety  ? [facility] [place], Dionne Bucy, MD  ? 1 year ago Adjustment disorder with anxiety  ? [facility] Bacigalupo, Dionne Bucy, MD  ? ?  ?  ? ? ?  ?  ?  ?  ?

## 2021-10-25 NOTE — Telephone Encounter (Signed)
Requested Prescriptions  ?Pending Prescriptions Disp Refills  ?? simvastatin (ZOCOR) 10 MG tablet [Pharmacy Med Name: SIMVASTATIN 10 MG TAB] 90 tablet 3  ?  Sig: TAKE ONE TABLET BY MOUTH AT BEDTIME  ?  ? Cardiovascular:  Antilipid - Statins Failed - 10/25/2021 10:27 AM  ?  ?  Failed - Lipid Panel in normal range within the last 12 months  ?  Cholesterol, Total  ?Date Value Ref Range Status  ?08/30/2021 155 100 - 199 mg/dL Final  ? ?LDL Chol Calc (NIH)  ?Date Value Ref Range Status  ?08/30/2021 85 0 - 99 mg/dL Final  ? ?HDL  ?Date Value Ref Range Status  ?08/30/2021 58 >39 mg/dL Final  ? ?Triglycerides  ?Date Value Ref Range Status  ?08/30/2021 60 0 - 149 mg/dL Final  ? ?  ?  ?  Passed - Patient is not pregnant  ?  ?  Passed - Valid encounter within last 12 months  ?  Recent Outpatient Visits   ?      ? 1 month ago Encounter for [employer] annual wellness exam  ? [facility] [place], Dionne Bucy, MD  ? 8 months ago COVID-19  ? [facility] Bacigalupo, Dionne Bucy, MD  ? 1 year ago Encounter for annual physical exam  ? [facility] [place], Dionne Bucy, MD  ? 1 year ago Adjustment disorder with anxiety  ? [facility] [place], Dionne Bucy, MD  ? 1 year ago Adjustment disorder with anxiety  ? [facility] [place], Dionne Bucy, MD  ?  ?  ? ?  ?  ?  ?? sertraline (ZOLOFT) 50 MG tablet [Pharmacy Med Name: SERTRALINE HCL 50 MG TAB] 90 tablet 1  ?  Sig: TAKE 1 TABLET BY MOUTH ONCE A DAY  ?  ? Not Delegated - Psychiatry:  Antidepressants - SSRI - sertraline Failed - 10/25/2021 10:27 AM  ?  ?  Failed - This refill cannot be delegated  ?  ?  Passed - AST in normal range and within 360 days  ?  AST  ?Date Value Ref Range Status  ?08/30/2021 25 0 - 40 IU/L Final  ?   ?  ?  Passed - ALT in normal range and within 360 days  ?  ALT  ?Date Value Ref Range Status  ?08/30/2021 18 0 - 32 IU/L Final  ?   ?  ?  Passed - Completed PHQ-2 or PHQ-9 in the last 360 days  ?  ?   Passed - Valid encounter within last 6 months  ?  Recent Outpatient Visits   ?      ? 1 month ago Encounter for [employer] annual wellness exam  ? [facility] [place], Dionne Bucy, MD  ? 8 months ago COVID-19  ? [facility] Bacigalupo, Dionne Bucy, MD  ? 1 year ago Encounter for annual physical exam  ? [facility] [place], Dionne Bucy, MD  ? 1 year ago Adjustment disorder with anxiety  ? [facility] [place], Dionne Bucy, MD  ? 1 year ago Adjustment disorder with anxiety  ? [facility] Bacigal[place], Dionne Bucy, MD  ?  ?  ? ?  ?  ?  ? ?

## 2021-10-26 ENCOUNTER — Encounter: Payer: Self-pay | Admitting: Family Medicine

## 2021-10-29 DIAGNOSIS — J301 Allergic rhinitis due to pollen: Secondary | ICD-10-CM | POA: Diagnosis not present

## 2021-11-05 DIAGNOSIS — J301 Allergic rhinitis due to pollen: Secondary | ICD-10-CM | POA: Diagnosis not present

## 2021-11-07 DIAGNOSIS — J301 Allergic rhinitis due to pollen: Secondary | ICD-10-CM | POA: Diagnosis not present

## 2021-11-12 DIAGNOSIS — J301 Allergic rhinitis due to pollen: Secondary | ICD-10-CM | POA: Diagnosis not present

## 2021-11-12 NOTE — Progress Notes (Signed)
? ? ?I,Sulibeya S Dimas,acting as a scribe for Lavon Paganini, MD.,have documented all relevant documentation on the behalf of Lavon Paganini, MD,as directed by  Lavon Paganini, MD while in the presence of Lavon Paganini, MD. ? ?MyChart Video Visit ? ? ? ?Virtual Visit via Video Note  ? ?This visit type was conducted due to national recommendations for restrictions regarding the COVID-19 Pandemic (e.g. social distancing) in an effort to limit this patient's exposure and mitigate transmission in our community. This patient is at least at moderate risk for complications without adequate follow up. This format is felt to be most appropriate for this patient at this time. Physical exam was limited by quality of the video and audio technology used for the visit.  ? ? ?Patient location: home ?Provider location: [facility] ?Persons involved in the visit: patient, provider ? ?I discussed the limitations of evaluation and management by telemedicine and the availability of in person appointments. The patient expressed understanding and agreed to proceed. ? ?Patient: Rita Webb   DOB: 11-15-1945   76 y.o. Female  MRN: [id number] ?Visit Date: 11/13/2021 ? ?Today's healthcare provider: Lavon Paganini, MD  ? ?Chief Complaint  ?Patient presents with  ? Osteoporosis  ? ?Subjective  ?  ?HPI  ?Patient seeing provider to discuss treatment for Osteoporosis. ? ? ?Medications: ?Outpatient Medications Prior to Visit  ?Medication Sig  ? acetaminophen (TYLENOL) 325 MG tablet Take 650 mg by mouth every 6 (six) hours as needed.  ? B COMPLEX-C PO Take 1 tablet by mouth daily.   ? Calcium Carbonate-Vitamin D 600-200 MG-UNIT CAPS Take 2 capsules by mouth daily.   ? Cholecalciferol 1000 UNITS capsule Take 1,000 Units by mouth daily.   ? ELIQUIS 5 MG TABS tablet TAKE 1 TABLET BY MOUTH TWICE A DAY  ? fluticasone (FLONASE) 50 MCG/ACT nasal spray Place into both nostrils.  ? latanoprost (XALATAN) 0.005 %  ophthalmic solution SMARTSIG:In Eye(s)  ? mometasone (ELOCON) 0.1 % lotion Apply topically.  ? MULTIPLE VITAMIN PO Take 1 tablet by mouth daily.   ? Omega-3 Fatty Acids (FISH OIL) 1200 MG CAPS Take 1 capsule by mouth daily.   ? psyllium (REGULOID) 0.52 G capsule Take 2 capsules by mouth 3 (three) times daily.  ? sertraline (ZOLOFT) 50 MG tablet TAKE 1 TABLET BY MOUTH ONCE A DAY  ? simvastatin (ZOCOR) 10 MG tablet TAKE ONE TABLET BY MOUTH AT BEDTIME  ? ?No facility-administered medications prior to visit.  ? ? ?Review of Systems per HPI ? ? ? ? Objective  ?  ?There were no vitals taken for this visit. ? ? ? ? ?Physical Exam ?Constitutional:   ?   General: She is not in acute distress. ?   Appearance: Normal appearance.  ?HENT:  ?   Head: Normocephalic.  ?Pulmonary:  ?   Effort: Pulmonary effort is normal. No respiratory distress.  ?Neurological:  ?   Mental Status: She is alert and oriented to person, place, and time. Mental status is at baseline.  ?  ? ? ? Assessment & Plan  ?  ? ?Problem List Items Addressed This Visit   ? ?  ? Musculoskeletal and Integument  ? Age-related osteoporosis without current pathological fracture - Primary  ?  Reviewed DEXA results with patient ?She again notes that the tech told her there was something on her L side - this is not in the images or report from Radiology - she has upcoming CT with Onc and will discuss with  them ?Discussed med options for osteoporosis that is worsening ?Continue Ca/VitD/exercise ?Will get dental exam ?Prolia may be a better fit than bisphosphonate for her ?She will discuss with Oncology and if they do not give Prolia we can send her to Endocrinology ? ?  ?  ?  ? ?Return for as scheduled.  ?  ? ?I discussed the assessment and treatment plan with the patient. The patient was provided an opportunity to ask questions and all were answered. The patient agreed with the plan and demonstrated an understanding of the instructions. ?  ?The patient was advised to call  back or seek an in-person evaluation if the symptoms worsen or if the condition fails to improve as anticipated. ? ?I, Lavon Paganini, MD, have reviewed all documentation for this visit. The documentation on 11/13/21 for the exam, diagnosis, procedures, and orders are all accurate and complete. ? ? ?Virginia Crews, MD, MPH ?[place] ?[facility]   ?

## 2021-11-13 ENCOUNTER — Encounter: Payer: Self-pay | Admitting: Family Medicine

## 2021-11-13 ENCOUNTER — Telehealth (INDEPENDENT_AMBULATORY_CARE_PROVIDER_SITE_OTHER): Payer: Medicare Other | Admitting: Family Medicine

## 2021-11-13 DIAGNOSIS — M810 Age-related osteoporosis without current pathological fracture: Secondary | ICD-10-CM

## 2021-11-13 NOTE — Patient Instructions (Addendum)
Ask Oncology if they do Prolia for osteoporosis treatment.  Could also consider Fosamax or other treatment also.  If they do not do these treatments, I will send a referral to Endocrinology. ?

## 2021-11-13 NOTE — Assessment & Plan Note (Signed)
Reviewed DEXA results with patient ?She again notes that the tech told her there was something on her L side - this is not in the images or report from Radiology - she has upcoming CT with Onc and will discuss with them ?Discussed med options for osteoporosis that is worsening ?Continue Ca/VitD/exercise ?Will get dental exam ?Prolia may be a better fit than bisphosphonate for her ?She will discuss with Oncology and if they do not give Prolia we can send her to Endocrinology ?

## 2021-11-14 ENCOUNTER — Telehealth: Payer: Self-pay

## 2021-11-14 NOTE — Telephone Encounter (Signed)
Copied from [place]. Topic: General - Other ?>> Nov 14, 2021 10:03 AM Erick Blinks wrote: ?Reason for CRM: April called from the patient's dental office, they are requesting a call back from the clinic. There is a form for medical clearance that they need to discuss. Please advise  ? ?Best contact: [phone] open until 3 pm. You can ask for April ?

## 2021-11-15 NOTE — Telephone Encounter (Signed)
Patient was seen for routine dental care on March 30th without any issues found.  She only has a few bottom teeth remaining.  Explained that she does not need a clearance form, more want her to be up-to-date on dental care before starting any treatment for osteoporosis. ? ?They did discuss with her that she does need to take her dentures out at night to help her palate and gums. ?

## 2021-11-16 ENCOUNTER — Encounter: Payer: Self-pay | Admitting: Family Medicine

## 2021-11-16 MED ORDER — ALENDRONATE SODIUM 70 MG PO TABS: 70.0000 mg | ORAL_TABLET | ORAL | 11 refills | Status: DC

## 2021-11-19 DIAGNOSIS — J301 Allergic rhinitis due to pollen: Secondary | ICD-10-CM | POA: Diagnosis not present

## 2021-11-26 DIAGNOSIS — J301 Allergic rhinitis due to pollen: Secondary | ICD-10-CM | POA: Diagnosis not present

## 2021-12-07 DIAGNOSIS — Z9841 Cataract extraction status, right eye: Secondary | ICD-10-CM | POA: Diagnosis not present

## 2021-12-07 DIAGNOSIS — Z9842 Cataract extraction status, left eye: Secondary | ICD-10-CM | POA: Diagnosis not present

## 2021-12-07 DIAGNOSIS — H401213 Low-tension glaucoma, right eye, severe stage: Secondary | ICD-10-CM | POA: Diagnosis not present

## 2021-12-07 DIAGNOSIS — H52213 Irregular astigmatism, bilateral: Secondary | ICD-10-CM | POA: Diagnosis not present

## 2021-12-10 DIAGNOSIS — J301 Allergic rhinitis due to pollen: Secondary | ICD-10-CM | POA: Diagnosis not present

## 2021-12-17 DIAGNOSIS — J301 Allergic rhinitis due to pollen: Secondary | ICD-10-CM | POA: Diagnosis not present

## 2021-12-24 DIAGNOSIS — J301 Allergic rhinitis due to pollen: Secondary | ICD-10-CM | POA: Diagnosis not present

## 2021-12-27 DIAGNOSIS — Z87891 Personal history of nicotine dependence: Secondary | ICD-10-CM | POA: Diagnosis not present

## 2021-12-27 DIAGNOSIS — W010XXA Fall on same level from slipping, tripping and stumbling without subsequent striking against object, initial encounter: Secondary | ICD-10-CM | POA: Diagnosis not present

## 2021-12-27 DIAGNOSIS — S098XXA Other specified injuries of head, initial encounter: Secondary | ICD-10-CM | POA: Diagnosis not present

## 2021-12-27 DIAGNOSIS — M1812 Unilateral primary osteoarthritis of first carpometacarpal joint, left hand: Secondary | ICD-10-CM | POA: Diagnosis not present

## 2021-12-27 DIAGNOSIS — S0990XA Unspecified injury of head, initial encounter: Secondary | ICD-10-CM | POA: Diagnosis not present

## 2021-12-27 DIAGNOSIS — S6992XA Unspecified injury of left wrist, hand and finger(s), initial encounter: Secondary | ICD-10-CM | POA: Diagnosis not present

## 2021-12-27 DIAGNOSIS — S51012A Laceration without foreign body of left elbow, initial encounter: Secondary | ICD-10-CM | POA: Diagnosis not present

## 2021-12-27 DIAGNOSIS — M19042 Primary osteoarthritis, left hand: Secondary | ICD-10-CM | POA: Diagnosis not present

## 2021-12-27 DIAGNOSIS — S60512A Abrasion of left hand, initial encounter: Secondary | ICD-10-CM | POA: Diagnosis not present

## 2021-12-27 DIAGNOSIS — W19XXXA Unspecified fall, initial encounter: Secondary | ICD-10-CM | POA: Diagnosis not present

## 2021-12-28 DIAGNOSIS — S51012A Laceration without foreign body of left elbow, initial encounter: Secondary | ICD-10-CM | POA: Diagnosis not present

## 2021-12-31 DIAGNOSIS — J301 Allergic rhinitis due to pollen: Secondary | ICD-10-CM | POA: Diagnosis not present

## 2022-01-14 DIAGNOSIS — J301 Allergic rhinitis due to pollen: Secondary | ICD-10-CM | POA: Diagnosis not present

## 2022-01-21 DIAGNOSIS — J301 Allergic rhinitis due to pollen: Secondary | ICD-10-CM | POA: Diagnosis not present

## 2022-01-28 DIAGNOSIS — J301 Allergic rhinitis due to pollen: Secondary | ICD-10-CM | POA: Diagnosis not present

## 2022-01-30 DIAGNOSIS — J301 Allergic rhinitis due to pollen: Secondary | ICD-10-CM | POA: Diagnosis not present

## 2022-02-04 DIAGNOSIS — J301 Allergic rhinitis due to pollen: Secondary | ICD-10-CM | POA: Diagnosis not present

## 2022-02-11 DIAGNOSIS — J301 Allergic rhinitis due to pollen: Secondary | ICD-10-CM | POA: Diagnosis not present

## 2022-02-18 DIAGNOSIS — J301 Allergic rhinitis due to pollen: Secondary | ICD-10-CM | POA: Diagnosis not present

## 2022-02-19 DIAGNOSIS — Z79899 Other long term (current) drug therapy: Secondary | ICD-10-CM | POA: Diagnosis not present

## 2022-02-19 DIAGNOSIS — Z905 Acquired absence of kidney: Secondary | ICD-10-CM | POA: Diagnosis not present

## 2022-02-19 DIAGNOSIS — R918 Other nonspecific abnormal finding of lung field: Secondary | ICD-10-CM | POA: Diagnosis not present

## 2022-02-19 DIAGNOSIS — Z08 Encounter for follow-up examination after completed treatment for malignant neoplasm: Secondary | ICD-10-CM | POA: Diagnosis not present

## 2022-02-19 DIAGNOSIS — Z902 Acquired absence of lung [part of]: Secondary | ICD-10-CM | POA: Diagnosis not present

## 2022-02-19 DIAGNOSIS — Z85118 Personal history of other malignant neoplasm of bronchus and lung: Secondary | ICD-10-CM | POA: Diagnosis not present

## 2022-02-19 DIAGNOSIS — Z7901 Long term (current) use of anticoagulants: Secondary | ICD-10-CM | POA: Diagnosis not present

## 2022-02-19 DIAGNOSIS — Z87891 Personal history of nicotine dependence: Secondary | ICD-10-CM | POA: Diagnosis not present

## 2022-02-19 DIAGNOSIS — C641 Malignant neoplasm of right kidney, except renal pelvis: Secondary | ICD-10-CM | POA: Diagnosis not present

## 2022-02-19 DIAGNOSIS — Z85528 Personal history of other malignant neoplasm of kidney: Secondary | ICD-10-CM | POA: Diagnosis not present

## 2022-02-19 DIAGNOSIS — C3491 Malignant neoplasm of unspecified part of right bronchus or lung: Secondary | ICD-10-CM | POA: Diagnosis not present

## 2022-02-25 DIAGNOSIS — J301 Allergic rhinitis due to pollen: Secondary | ICD-10-CM | POA: Diagnosis not present

## 2022-03-04 DIAGNOSIS — J301 Allergic rhinitis due to pollen: Secondary | ICD-10-CM | POA: Diagnosis not present

## 2022-03-06 DIAGNOSIS — Z85828 Personal history of other malignant neoplasm of skin: Secondary | ICD-10-CM | POA: Diagnosis not present

## 2022-03-06 DIAGNOSIS — L720 Epidermal cyst: Secondary | ICD-10-CM | POA: Diagnosis not present

## 2022-03-06 DIAGNOSIS — D225 Melanocytic nevi of trunk: Secondary | ICD-10-CM | POA: Diagnosis not present

## 2022-03-06 DIAGNOSIS — Z85820 Personal history of malignant melanoma of skin: Secondary | ICD-10-CM | POA: Diagnosis not present

## 2022-03-18 DIAGNOSIS — J301 Allergic rhinitis due to pollen: Secondary | ICD-10-CM | POA: Diagnosis not present

## 2022-03-21 DIAGNOSIS — J300 Vasomotor rhinitis: Secondary | ICD-10-CM | POA: Diagnosis not present

## 2022-03-21 DIAGNOSIS — J309 Allergic rhinitis, unspecified: Secondary | ICD-10-CM | POA: Diagnosis not present

## 2022-03-25 DIAGNOSIS — J301 Allergic rhinitis due to pollen: Secondary | ICD-10-CM | POA: Diagnosis not present

## 2022-04-08 DIAGNOSIS — J301 Allergic rhinitis due to pollen: Secondary | ICD-10-CM | POA: Diagnosis not present

## 2022-04-12 ENCOUNTER — Other Ambulatory Visit: Payer: Self-pay | Admitting: Family Medicine

## 2022-04-12 DIAGNOSIS — C641 Malignant neoplasm of right kidney, except renal pelvis: Secondary | ICD-10-CM | POA: Diagnosis not present

## 2022-04-12 DIAGNOSIS — Z1231 Encounter for screening mammogram for malignant neoplasm of breast: Secondary | ICD-10-CM

## 2022-04-12 DIAGNOSIS — C3491 Malignant neoplasm of unspecified part of right bronchus or lung: Secondary | ICD-10-CM | POA: Diagnosis not present

## 2022-04-12 DIAGNOSIS — Z7901 Long term (current) use of anticoagulants: Secondary | ICD-10-CM | POA: Diagnosis not present

## 2022-04-12 DIAGNOSIS — Z86718 Personal history of other venous thrombosis and embolism: Secondary | ICD-10-CM | POA: Diagnosis not present

## 2022-04-22 DIAGNOSIS — J301 Allergic rhinitis due to pollen: Secondary | ICD-10-CM | POA: Diagnosis not present

## 2022-04-29 DIAGNOSIS — H401213 Low-tension glaucoma, right eye, severe stage: Secondary | ICD-10-CM | POA: Diagnosis not present

## 2022-04-29 DIAGNOSIS — J301 Allergic rhinitis due to pollen: Secondary | ICD-10-CM | POA: Diagnosis not present

## 2022-04-29 DIAGNOSIS — H401221 Low-tension glaucoma, left eye, mild stage: Secondary | ICD-10-CM | POA: Diagnosis not present

## 2022-05-03 DIAGNOSIS — J301 Allergic rhinitis due to pollen: Secondary | ICD-10-CM | POA: Diagnosis not present

## 2022-05-06 ENCOUNTER — Encounter (INDEPENDENT_AMBULATORY_CARE_PROVIDER_SITE_OTHER): Payer: Self-pay

## 2022-05-06 DIAGNOSIS — J301 Allergic rhinitis due to pollen: Secondary | ICD-10-CM | POA: Diagnosis not present

## 2022-05-09 ENCOUNTER — Encounter: Payer: Self-pay | Admitting: Family Medicine

## 2022-05-09 ENCOUNTER — Ambulatory Visit: Payer: Medicare Other | Admitting: Family Medicine

## 2022-05-09 VITALS — BP 134/72 | HR 71 | Temp 97.7°F | Resp 16 | Wt 119.0 lb

## 2022-05-09 DIAGNOSIS — Z23 Encounter for immunization: Secondary | ICD-10-CM

## 2022-05-09 DIAGNOSIS — R198 Other specified symptoms and signs involving the digestive system and abdomen: Secondary | ICD-10-CM | POA: Diagnosis not present

## 2022-05-09 MED ORDER — CLOTRIMAZOLE-BETAMETHASONE 1-0.05 % EX CREA: 1.0000 | TOPICAL_CREAM | Freq: Every day | CUTANEOUS | 0 refills | Status: AC

## 2022-05-09 NOTE — Progress Notes (Signed)
I,April Miller,acting as a scribe for Lavon Paganini, MD.,have documented all relevant documentation on the behalf of Lavon Paganini, MD,as directed by  Lavon Paganini, MD while in the presence of Lavon Paganini, MD.  Established patient visit   Patient: Rita Webb   DOB: [birth year]   76 y.o. Female  MRN: [id number] Visit Date: 05/09/2022  Today's healthcare provider: Lavon Paganini, MD   Chief Complaint  Patient presents with   Rash   Subjective    HPI  Patient has had redness and drainage in her belly button for the past 2 months.  Cleaned it out and then it was better, back x5 days. Burning with usin alcohol wipe/q tip  No abd pain, fever, loss of appetite.  Medications: Outpatient Medications Prior to Visit  Medication Sig   acetaminophen (TYLENOL) 325 MG tablet Take 650 mg by mouth every 6 (six) hours as needed.   B COMPLEX-C PO Take 1 tablet by mouth daily.    Calcium Carbonate-Vitamin D 600-200 MG-UNIT CAPS Take 2 capsules by mouth daily.    Cholecalciferol 1000 UNITS capsule Take 1,000 Units by mouth daily.    ELIQUIS 5 MG TABS tablet TAKE 1 TABLET BY MOUTH TWICE A DAY   fluticasone (FLONASE) 50 MCG/ACT nasal spray Place into both nostrils.   latanoprost (XALATAN) 0.005 % ophthalmic solution SMARTSIG:In Eye(s)   mometasone (ELOCON) 0.1 % lotion Apply topically.   MULTIPLE VITAMIN PO Take 1 tablet by mouth daily.    Omega-3 Fatty Acids (FISH OIL) 1200 MG CAPS Take 1 capsule by mouth daily.    psyllium (REGULOID) 0.52 G capsule Take 2 capsules by mouth 3 (three) times daily.   sertraline (ZOLOFT) 50 MG tablet TAKE 1 TABLET BY MOUTH ONCE A DAY   simvastatin (ZOCOR) 10 MG tablet TAKE ONE TABLET BY MOUTH AT BEDTIME   alendronate (FOSAMAX) 70 MG tablet Take 1 tablet (70 mg total) by mouth every 7 (seven) days. Take with a full glass of water on an empty stomach. (Patient not taking: Reported on 05/09/2022)   No facility-administered  medications prior to visit.    Review of Systems  Constitutional:  Negative for appetite change, chills, fatigue and fever.  Respiratory:  Negative for chest tightness and shortness of breath.   Cardiovascular:  Negative for chest pain and palpitations.  Gastrointestinal:  Negative for abdominal pain, nausea and vomiting.  Neurological:  Negative for dizziness and weakness.       Objective    BP 134/72 (BP Location: Left Arm, Patient Position: Sitting, Cuff Size: Normal)   Pulse 71   Temp 97.7 F (36.5 C) (Temporal)   Resp 16   Wt 119 lb (54 kg)   SpO2 98%   BMI 20.43 kg/m    Physical Exam Vitals reviewed.  Constitutional:      General: She is not in acute distress.    Appearance: She is well-developed.  HENT:     Head: Normocephalic and atraumatic.  Eyes:     General: No scleral icterus.    Conjunctiva/sclera: Conjunctivae normal.  Cardiovascular:     Rate and Rhythm: Normal rate and regular rhythm.  Pulmonary:     Effort: Pulmonary effort is normal. No respiratory distress.  Abdominal:     General: There is no distension.     Palpations: Abdomen is soft.     Tenderness: There is no abdominal tenderness.  Skin:    General: Skin is warm and dry.     Comments:  Erythema inside umbilicus, nothing surrounding umbilicus, no active drainage  Neurological:     Mental Status: She is alert and oriented to person, place, and time.  Psychiatric:        Behavior: Behavior normal.     No results found for any visits on 05/09/22.  Assessment & Plan     1. Umbilicus discharge - suspect yeast infection - treat with lotrisone daily x7d - return precautions discussed   Meds ordered this encounter  Medications   clotrimazole-betamethasone (LOTRISONE) cream    Sig: Apply 1 Application topically daily.    Dispense:  30 g    Refill:  0     Return if symptoms worsen or fail to improve.      I, Lavon Paganini, MD, have reviewed all documentation for this visit. The  documentation on 05/09/22 for the exam, diagnosis, procedures, and orders are all accurate and complete.   Aubriegh Minch, Dionne Bucy, MD, MPH [facility]

## 2022-05-13 DIAGNOSIS — J301 Allergic rhinitis due to pollen: Secondary | ICD-10-CM | POA: Diagnosis not present

## 2022-05-15 ENCOUNTER — Ambulatory Visit
Admission: RE | Admit: 2022-05-15 | Discharge: 2022-05-15 | Disposition: A | Discharge status: Home / Self Care | Payer: Medicare Other | Source: Ambulatory Visit | Attending: Family Medicine | Admitting: Family Medicine

## 2022-05-15 DIAGNOSIS — Z1231 Encounter for screening mammogram for malignant neoplasm of breast: Secondary | ICD-10-CM | POA: Diagnosis not present

## 2022-05-20 ENCOUNTER — Telehealth: Payer: Self-pay

## 2022-05-20 DIAGNOSIS — J301 Allergic rhinitis due to pollen: Secondary | ICD-10-CM | POA: Diagnosis not present

## 2022-05-20 NOTE — Telephone Encounter (Signed)
-----   Message from Virginia Crews, MD sent at 05/20/2022 11:07 AM EST ----- Normal mammogram. Repeat in 1 yr

## 2022-05-20 NOTE — Telephone Encounter (Signed)
Left message for patient regarding mammogram results.

## 2022-05-27 DIAGNOSIS — J301 Allergic rhinitis due to pollen: Secondary | ICD-10-CM | POA: Diagnosis not present

## 2022-06-03 DIAGNOSIS — J301 Allergic rhinitis due to pollen: Secondary | ICD-10-CM | POA: Diagnosis not present

## 2022-06-10 DIAGNOSIS — J301 Allergic rhinitis due to pollen: Secondary | ICD-10-CM | POA: Diagnosis not present

## 2022-06-17 DIAGNOSIS — J301 Allergic rhinitis due to pollen: Secondary | ICD-10-CM | POA: Diagnosis not present

## 2022-06-24 DIAGNOSIS — J301 Allergic rhinitis due to pollen: Secondary | ICD-10-CM | POA: Diagnosis not present

## 2022-07-15 DIAGNOSIS — J301 Allergic rhinitis due to pollen: Secondary | ICD-10-CM | POA: Diagnosis not present

## 2022-07-22 DIAGNOSIS — J301 Allergic rhinitis due to pollen: Secondary | ICD-10-CM | POA: Diagnosis not present

## 2022-07-25 ENCOUNTER — Other Ambulatory Visit: Payer: Self-pay | Admitting: Family Medicine

## 2022-07-25 DIAGNOSIS — E7800 Pure hypercholesterolemia, unspecified: Secondary | ICD-10-CM

## 2022-07-26 DIAGNOSIS — J301 Allergic rhinitis due to pollen: Secondary | ICD-10-CM | POA: Diagnosis not present

## 2022-07-29 DIAGNOSIS — J301 Allergic rhinitis due to pollen: Secondary | ICD-10-CM | POA: Diagnosis not present

## 2022-08-05 DIAGNOSIS — J301 Allergic rhinitis due to pollen: Secondary | ICD-10-CM | POA: Diagnosis not present

## 2022-08-12 DIAGNOSIS — J301 Allergic rhinitis due to pollen: Secondary | ICD-10-CM | POA: Diagnosis not present

## 2022-08-13 ENCOUNTER — Telehealth: Payer: Self-pay

## 2022-08-13 NOTE — Telephone Encounter (Signed)
Copied from [place]. Topic: General - Inquiry >> Aug 13, 2022 11:30 AM Erskine Squibb wrote: Reason for CRM: The patient is scheduled to have a covid booster on Thursday but she wonders if this is ok with the fact she has a CT scan scheduled next Tuesday? Please assist patient further.

## 2022-08-13 NOTE — Telephone Encounter (Signed)
No problem getting CT after COVID booster. This only affects mammogram scheduling.

## 2022-08-13 NOTE — Telephone Encounter (Signed)
Patient advised.

## 2022-08-19 DIAGNOSIS — J301 Allergic rhinitis due to pollen: Secondary | ICD-10-CM | POA: Diagnosis not present

## 2022-08-20 DIAGNOSIS — C641 Malignant neoplasm of right kidney, except renal pelvis: Secondary | ICD-10-CM | POA: Diagnosis not present

## 2022-08-20 DIAGNOSIS — R053 Chronic cough: Secondary | ICD-10-CM | POA: Diagnosis not present

## 2022-08-20 DIAGNOSIS — C3491 Malignant neoplasm of unspecified part of right bronchus or lung: Secondary | ICD-10-CM | POA: Diagnosis not present

## 2022-08-20 DIAGNOSIS — Z87891 Personal history of nicotine dependence: Secondary | ICD-10-CM | POA: Diagnosis not present

## 2022-08-20 DIAGNOSIS — R918 Other nonspecific abnormal finding of lung field: Secondary | ICD-10-CM | POA: Diagnosis not present

## 2022-08-26 DIAGNOSIS — J301 Allergic rhinitis due to pollen: Secondary | ICD-10-CM | POA: Diagnosis not present

## 2022-08-27 DIAGNOSIS — H401213 Low-tension glaucoma, right eye, severe stage: Secondary | ICD-10-CM | POA: Diagnosis not present

## 2022-08-27 DIAGNOSIS — H401221 Low-tension glaucoma, left eye, mild stage: Secondary | ICD-10-CM | POA: Diagnosis not present

## 2022-09-02 ENCOUNTER — Encounter: Payer: Medicare Other | Admitting: Family Medicine

## 2022-09-09 ENCOUNTER — Ambulatory Visit (INDEPENDENT_AMBULATORY_CARE_PROVIDER_SITE_OTHER): Payer: Medicare Other | Admitting: Family Medicine

## 2022-09-09 ENCOUNTER — Encounter: Payer: Self-pay | Admitting: Family Medicine

## 2022-09-09 VITALS — BP 121/70 | HR 67 | Temp 98.6°F | Resp 12 | Ht 64.0 in | Wt 121.8 lb

## 2022-09-09 DIAGNOSIS — R7303 Prediabetes: Secondary | ICD-10-CM | POA: Diagnosis not present

## 2022-09-09 DIAGNOSIS — C641 Malignant neoplasm of right kidney, except renal pelvis: Secondary | ICD-10-CM

## 2022-09-09 DIAGNOSIS — Z79899 Other long term (current) drug therapy: Secondary | ICD-10-CM

## 2022-09-09 DIAGNOSIS — I82512 Chronic embolism and thrombosis of left femoral vein: Secondary | ICD-10-CM

## 2022-09-09 DIAGNOSIS — Z0000 Encounter for general adult medical examination without abnormal findings: Secondary | ICD-10-CM | POA: Diagnosis not present

## 2022-09-09 DIAGNOSIS — M810 Age-related osteoporosis without current pathological fracture: Secondary | ICD-10-CM

## 2022-09-09 DIAGNOSIS — Z7901 Long term (current) use of anticoagulants: Secondary | ICD-10-CM

## 2022-09-09 DIAGNOSIS — E7800 Pure hypercholesterolemia, unspecified: Secondary | ICD-10-CM | POA: Diagnosis not present

## 2022-09-09 DIAGNOSIS — C3491 Malignant neoplasm of unspecified part of right bronchus or lung: Secondary | ICD-10-CM

## 2022-09-09 DIAGNOSIS — E559 Vitamin D deficiency, unspecified: Secondary | ICD-10-CM | POA: Diagnosis not present

## 2022-09-09 DIAGNOSIS — J301 Allergic rhinitis due to pollen: Secondary | ICD-10-CM | POA: Diagnosis not present

## 2022-09-09 DIAGNOSIS — G2581 Restless legs syndrome: Secondary | ICD-10-CM | POA: Diagnosis not present

## 2022-09-09 NOTE — Progress Notes (Signed)
I,Sulibeya S Dimas,acting as a Education administrator for Lavon Paganini, MD.,have documented all relevant documentation on the behalf of Lavon Paganini, MD,as directed by  Lavon Paganini, MD while in the presence of Lavon Paganini, MD.    Annual Wellness Visit     Patient: Rita Webb, Female    DOB: 05-04-1946, 77 y.o.   MRN: [id number] Visit Date: 09/09/2022  Today's Provider: Lavon Paganini, MD   Chief Complaint  Patient presents with   Medicare Wellness   Subjective    Rita Webb is a 77 y.o. female who presents today for her Annual Wellness Visit. She reports consuming a general diet.  She generally feels fairly well. She reports sleeping well. She does have additional problems to discuss today.   Patient states she has restless legs at night - longstanding, but worsening. Doesn't bother her enough to take anything.   HPI  Medications: Outpatient Medications Prior to Visit  Medication Sig   acetaminophen (TYLENOL) 325 MG tablet Take 650 mg by mouth every 6 (six) hours as needed.   B COMPLEX-C PO Take 1 tablet by mouth daily.    Calcium Carbonate-Vitamin D 600-200 MG-UNIT CAPS Take 2 capsules by mouth daily.    Cholecalciferol 1000 UNITS capsule Take 1,000 Units by mouth daily.    clotrimazole-betamethasone (LOTRISONE) cream Apply 1 Application topically daily.   ELIQUIS 5 MG TABS tablet TAKE 1 TABLET BY MOUTH TWICE A DAY   fluticasone (FLONASE) 50 MCG/ACT nasal spray Place into both nostrils.   latanoprost (XALATAN) 0.005 % ophthalmic solution SMARTSIG:In Eye(s)   mometasone (ELOCON) 0.1 % lotion Apply topically.   MULTIPLE VITAMIN PO Take 1 tablet by mouth daily.    Omega-3 Fatty Acids (FISH OIL) 1200 MG CAPS Take 1 capsule by mouth daily.    psyllium (REGULOID) 0.52 G capsule Take 2 capsules by mouth 3 (three) times daily.   sertraline (ZOLOFT) 50 MG tablet TAKE 1 TABLET BY MOUTH ONCE A DAY   simvastatin (ZOCOR) 10 MG tablet TAKE ONE TABLET BY  MOUTH AT BEDTIME   alendronate (FOSAMAX) 70 MG tablet Take 1 tablet (70 mg total) by mouth every 7 (seven) days. Take with a full glass of water on an empty stomach. (Patient not taking: Reported on 05/09/2022)   No facility-administered medications prior to visit.    Allergies  Allergen Reactions   Cefuroxime Diarrhea    Patient Care Team: Virginia Crews, MD as PCP - General (Family Medicine) Thelma Comp, [place] (Optometry) Vevelyn Royals, MD as Consulting Physician (Ophthalmology) Dionne Milo, Rockney Ghee, MD as Referring Physician (Hematology and Oncology) Timoteo Gaul, MD as Referring Physician (Urology) Lucky Cowboy Erskine Squibb, MD as Referring Physician (Vascular Surgery) Beverly Gust, MD (Otolaryngology) Dasher, Rayvon Char, MD (Dermatology)  Review of Systems  All other systems reviewed and are negative.       Objective    Vitals: BP 121/70 (BP Location: Left Arm, Patient Position: Sitting, Cuff Size: Normal)   Pulse 67   Temp 98.6 F (37 C) (Temporal)   Resp 12   Ht '5\' 4"'$  (1.626 m)   Wt 121 lb 12.8 oz (55.2 kg)   SpO2 96%   BMI 20.91 kg/m     Physical Exam Vitals reviewed.  Constitutional:      General: She is not in acute distress.    Appearance: Normal appearance. She is well-developed. She is not diaphoretic.  HENT:     Head: Normocephalic and atraumatic.     Right Ear: Tympanic membrane, ear  canal and external ear normal.     Left Ear: Tympanic membrane, ear canal and external ear normal.     Nose: Nose normal.     Mouth/Throat:     Mouth: Mucous membranes are moist.     Pharynx: Oropharynx is clear. No oropharyngeal exudate.  Eyes:     General: No scleral icterus.    Conjunctiva/sclera: Conjunctivae normal.     Pupils: Pupils are equal, round, and reactive to light.  Neck:     Thyroid: No thyromegaly.  Cardiovascular:     Rate and Rhythm: Normal rate and regular rhythm.     Heart sounds: Normal heart sounds. No murmur heard. Pulmonary:      Effort: Pulmonary effort is normal. No respiratory distress.     Breath sounds: Normal breath sounds. No wheezing or rales.  Abdominal:     General: There is no distension.     Palpations: Abdomen is soft.     Tenderness: There is no abdominal tenderness.  Musculoskeletal:        General: No deformity.     Cervical back: Neck supple.     Right lower leg: No edema.     Left lower leg: No edema.  Lymphadenopathy:     Cervical: No cervical adenopathy.  Skin:    General: Skin is warm and dry.     Findings: No rash.  Neurological:     Mental Status: She is alert and oriented to person, place, and time. Mental status is at baseline.     Gait: Gait normal.  Psychiatric:        Mood and Affect: Mood normal.        Behavior: Behavior normal.        Thought Content: Thought content normal.      Most recent functional status assessment:    09/09/2022   10:24 AM  In your present state of health, do you have any difficulty performing the following activities:  Hearing? 0  Vision? 0  Difficulty concentrating or making decisions? 0  Walking or climbing stairs? 0  Dressing or bathing? 0  Doing errands, shopping? 0   Most recent fall risk assessment:    09/09/2022   10:24 AM  Fall Risk   Falls in the past year? 1  Number falls in past yr: 0  Injury with Fall? 0    Most recent depression screenings:    09/09/2022   10:24 AM 08/30/2021    9:31 AM  PHQ 2/9 Scores  PHQ - 2 Score 0 0  PHQ- 9 Score 0 2   Most recent cognitive screening:    08/30/2021    9:21 AM  6CIT Screen  What Year? 0 points  What month? 0 points  What time? 0 points  Count back from 20 0 points  Months in reverse 0 points  Repeat phrase 0 points  Total Score 0 points   Most recent Audit-C alcohol use screening    09/09/2022   10:24 AM  Alcohol Use Disorder Test (AUDIT)  1. How often do you have a drink containing alcohol? 0  2. How many drinks containing alcohol do you have on a typical day when  you are drinking? 0  3. How often do you have six or more drinks on one occasion? 0  AUDIT-C Score 0   A score of 3 or more in women, and 4 or more in men indicates increased risk for alcohol abuse, EXCEPT if all of the points are  from question 1   No results found for any visits on 09/09/22.  Assessment & Plan     Annual wellness visit done today including the all of the following: Reviewed patient's Family Medical History Reviewed and updated list of patient's medical providers Assessment of cognitive impairment was done Assessed patient's functional ability Established a written schedule for health screening [place] Completed and Reviewed  Exercise Activities and Dietary recommendations  Goals      Increase physical activity        Immunization History  Administered Date(s) Administered   Fluad Quad(high Dose 65+) 04/13/2021, 05/09/2022   Influenza Split 07/21/2012   Influenza, High Dose Seasonal PF 04/14/2014, 05/11/2015, 05/04/2016, 05/08/2017, 04/25/2018   Influenza,inj,Quad PF,6+ Mos 04/15/2013   Influenza-Unspecified 04/19/2020, 05/16/2021   PFIZER(Purple Top)SARS-COV-2 Vaccination 12/14/2019, 01/06/2020, 07/14/2020   Pfizer Covid-19 Vaccine Bivalent Booster 59yr & up 05/18/2021   Pneumococcal Conjugate-13 12/29/2013   Pneumococcal Polysaccharide-23 12/28/2012   Tdap 11/27/2011   Zoster Recombinat (Shingrix) 03/05/2018, 06/11/2018   Zoster, Live 01/23/2011    Health Maintenance  Topic Date Due   DTaP/Tdap/Td (2 - Td or Tdap) 11/26/2021   COVID-19 Vaccine (5 - 2023-24 season) 03/08/2022   Medicare Annual Wellness (AWV)  09/09/2023   DEXA SCAN  10/25/2023   MAMMOGRAM  05/15/2024   COLONOSCOPY (Pts 45-451yrInsurance coverage will need to be confirmed)  09/25/2028   Pneumonia Vaccine 6549Years old  Completed   INFLUENZA VACCINE  Completed   Hepatitis C Screening  Completed   Zoster Vaccines- Shingrix  Completed   HPV VACCINES  Aged  Out     Discussed health benefits of physical activity, and encouraged her to engage in regular exercise appropriate for her age and condition.    Problem List Items Addressed This Visit       Cardiovascular and Mediastinum   DVT (deep venous thrombosis) (HCC)    F/b VVS On chronic eliquis with IVC filter in place Malignancy does make clot risk high without anticoag        Respiratory   Non-small cell lung cancer, right ([place]   F/b Onc Patient reports getting good scans and reviews        Musculoskeletal and Integument   Age-related osteoporosis without current pathological fracture    Has not started fosamax pending dental work      Relevant Orders   VITAMIN D 25 Hydroxy (Vit-D Deficiency, Fractures)     Genitourinary   [place]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, right ([place]   F/b Onc Patient reports getting good scans and reviews        Other   Hypercholesteremia    Previously well controlled Continue statin Repeat FLP and CMP      Relevant Orders   Comprehensive metabolic panel   Lipid panel   Avitaminosis D    Continue supplement Recheck level       Relevant Orders   VITAMIN D 25 Hydroxy (Vit-D Deficiency, Fractures)   Prediabetes    Recommend low carb diet Recheck A1c       Relevant Orders   Hemoglobin A1c   Other Visit Diagnoses     Encounter for annual wellness visit (AWV) in Medicare patient    -  Primary   Encounter for annual physical exam       Relevant Orders   Hemoglobin A1c   CBC   Comprehensive metabolic panel   Lipid panel   VITAMIN D 25 Hydroxy (Vit-D Deficiency, Fractures)   Restless  leg syndrome       Long-term use of high-risk medication       Relevant Orders   CBC        Return in about 1 year (around 09/09/2023) for CPE, AWV.     I, Lavon Paganini, MD, have reviewed all documentation for this visit. The documentation on 09/09/22 for the exam, diagnosis, procedures, and orders are all accurate and complete.   Dhaval Woo,  Dionne Bucy, MD, MPH [facility]

## 2022-09-09 NOTE — Assessment & Plan Note (Signed)
Recommend low carb diet °Recheck A1c  °

## 2022-09-09 NOTE — Assessment & Plan Note (Signed)
Previously well controlled Continue statin Repeat FLP and CMP  

## 2022-09-09 NOTE — Assessment & Plan Note (Signed)
F/b Onc Patient reports getting good scans and reviews

## 2022-09-09 NOTE — Assessment & Plan Note (Signed)
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

## 2022-09-09 NOTE — Assessment & Plan Note (Signed)
Continue supplement Recheck level 

## 2022-09-09 NOTE — Assessment & Plan Note (Signed)
Has not started fosamax pending dental work

## 2022-09-10 LAB — CBC
Hematocrit: 36.2 % (ref 34.0–46.6)
Hemoglobin: 12.3 g/dL (ref 11.1–15.9)
MCH: 30.7 pg (ref 26.6–33.0)
MCHC: 34 g/dL (ref 31.5–35.7)
MCV: 90 fL (ref 79–97)
Platelets: 263 10*3/uL (ref 150–450)
RBC: 4.01 x10E6/uL (ref 3.77–5.28)
RDW: 12.1 % (ref 11.7–15.4)
WBC: 6.9 10*3/uL (ref 3.4–10.8)

## 2022-09-10 LAB — COMPREHENSIVE METABOLIC PANEL
ALT: 18 IU/L (ref 0–32)
AST: 25 IU/L (ref 0–40)
Albumin/Globulin Ratio: 2.3 — ABNORMAL HIGH (ref 1.2–2.2)
Albumin: 4.8 g/dL (ref 3.8–4.8)
Alkaline Phosphatase: 71 IU/L (ref 44–121)
BUN/Creatinine Ratio: 19 (ref 12–28)
BUN: 16 mg/dL (ref 8–27)
Bilirubin Total: 0.4 mg/dL (ref 0.0–1.2)
CO2: 22 mmol/L (ref 20–29)
Calcium: 9.3 mg/dL (ref 8.7–10.3)
Chloride: 102 mmol/L (ref 96–106)
Creatinine, Ser: 0.85 mg/dL (ref 0.57–1.00)
Globulin, Total: 2.1 g/dL (ref 1.5–4.5)
Glucose: 97 mg/dL (ref 70–99)
Potassium: 4.6 mmol/L (ref 3.5–5.2)
Sodium: 138 mmol/L (ref 134–144)
Total Protein: 6.9 g/dL (ref 6.0–8.5)
eGFR: 72 mL/min/{1.73_m2} (ref >59)

## 2022-09-10 LAB — LIPID PANEL
Chol/HDL Ratio: 2.3 ratio (ref 0.0–4.4)
Cholesterol, Total: 146 mg/dL (ref 100–199)
HDL: 63 mg/dL (ref >39)
LDL Chol Calc (NIH): 70 mg/dL (ref 0–99)
Triglycerides: 65 mg/dL (ref 0–149)
VLDL Cholesterol Cal: 13 mg/dL (ref 5–40)

## 2022-09-10 LAB — HEMOGLOBIN A1C
Est. average glucose Bld gHb Est-mCnc: 120 mg/dL
Hgb A1c MFr Bld: 5.8 % — ABNORMAL HIGH (ref 4.8–5.6)

## 2022-09-10 LAB — VITAMIN D 25 HYDROXY (VIT D DEFICIENCY, FRACTURES): Vit D, 25-Hydroxy: 60 ng/mL (ref 30.0–100.0)

## 2022-09-16 DIAGNOSIS — J301 Allergic rhinitis due to pollen: Secondary | ICD-10-CM | POA: Diagnosis not present

## 2022-09-23 DIAGNOSIS — J301 Allergic rhinitis due to pollen: Secondary | ICD-10-CM | POA: Diagnosis not present

## 2022-09-30 DIAGNOSIS — J301 Allergic rhinitis due to pollen: Secondary | ICD-10-CM | POA: Diagnosis not present

## 2022-10-07 DIAGNOSIS — J301 Allergic rhinitis due to pollen: Secondary | ICD-10-CM | POA: Diagnosis not present

## 2022-10-14 DIAGNOSIS — J301 Allergic rhinitis due to pollen: Secondary | ICD-10-CM | POA: Diagnosis not present

## 2022-10-16 DIAGNOSIS — J301 Allergic rhinitis due to pollen: Secondary | ICD-10-CM | POA: Diagnosis not present

## 2022-10-21 DIAGNOSIS — J301 Allergic rhinitis due to pollen: Secondary | ICD-10-CM | POA: Diagnosis not present

## 2022-10-23 ENCOUNTER — Other Ambulatory Visit: Payer: Self-pay | Admitting: Family Medicine

## 2022-10-28 DIAGNOSIS — J301 Allergic rhinitis due to pollen: Secondary | ICD-10-CM | POA: Diagnosis not present

## 2022-11-04 DIAGNOSIS — J301 Allergic rhinitis due to pollen: Secondary | ICD-10-CM | POA: Diagnosis not present

## 2022-11-11 DIAGNOSIS — J301 Allergic rhinitis due to pollen: Secondary | ICD-10-CM | POA: Diagnosis not present

## 2022-11-18 DIAGNOSIS — J301 Allergic rhinitis due to pollen: Secondary | ICD-10-CM | POA: Diagnosis not present

## 2022-11-25 DIAGNOSIS — J301 Allergic rhinitis due to pollen: Secondary | ICD-10-CM | POA: Diagnosis not present

## 2022-12-09 DIAGNOSIS — J301 Allergic rhinitis due to pollen: Secondary | ICD-10-CM | POA: Diagnosis not present

## 2022-12-23 DIAGNOSIS — J301 Allergic rhinitis due to pollen: Secondary | ICD-10-CM | POA: Diagnosis not present

## 2022-12-24 DIAGNOSIS — Z9842 Cataract extraction status, left eye: Secondary | ICD-10-CM | POA: Diagnosis not present

## 2022-12-24 DIAGNOSIS — Z9841 Cataract extraction status, right eye: Secondary | ICD-10-CM | POA: Diagnosis not present

## 2022-12-24 DIAGNOSIS — H401221 Low-tension glaucoma, left eye, mild stage: Secondary | ICD-10-CM | POA: Diagnosis not present

## 2022-12-24 DIAGNOSIS — H52213 Irregular astigmatism, bilateral: Secondary | ICD-10-CM | POA: Diagnosis not present

## 2022-12-24 DIAGNOSIS — H401213 Low-tension glaucoma, right eye, severe stage: Secondary | ICD-10-CM | POA: Diagnosis not present

## 2022-12-30 DIAGNOSIS — J301 Allergic rhinitis due to pollen: Secondary | ICD-10-CM | POA: Diagnosis not present

## 2023-01-06 DIAGNOSIS — J301 Allergic rhinitis due to pollen: Secondary | ICD-10-CM | POA: Diagnosis not present

## 2023-01-07 DIAGNOSIS — J301 Allergic rhinitis due to pollen: Secondary | ICD-10-CM | POA: Diagnosis not present

## 2023-01-13 DIAGNOSIS — J301 Allergic rhinitis due to pollen: Secondary | ICD-10-CM | POA: Diagnosis not present

## 2023-01-20 DIAGNOSIS — J301 Allergic rhinitis due to pollen: Secondary | ICD-10-CM | POA: Diagnosis not present

## 2023-01-21 ENCOUNTER — Other Ambulatory Visit: Payer: Self-pay | Admitting: Family Medicine

## 2023-01-22 NOTE — Telephone Encounter (Signed)
Requested Prescriptions  Pending Prescriptions Disp Refills   sertraline (ZOLOFT) 50 MG tablet [Pharmacy Med Name: SERTRALINE HCL 50MG  TABLET] 90 tablet 1    Sig: TAKE ONE TABLET BY MOUTH ONCE A DAY     Psychiatry:  Antidepressants - SSRI - sertraline Passed - 01/21/2023  4:08 PM      Passed - AST in normal range and within 360 days    AST  Date Value Ref Range Status  09/09/2022 25 0 - 40 IU/L Final         Passed - ALT in normal range and within 360 days    ALT  Date Value Ref Range Status  09/09/2022 18 0 - 32 IU/L Final         Passed - Completed PHQ-2 or PHQ-9 in the last 360 days      Passed - Valid encounter within last 6 months    Recent Outpatient Visits           4 months ago Encounter for annual wellness visit (AWV) in Medicare patient   [place] [facility] [place], Marzella Schlein, MD   8 months ago Umbilicus discharge   [place] [facility] [place], Marzella Schlein, MD   1 year ago Age-related osteoporosis without current pathological fracture   [facility] [place], Marzella Schlein, MD   1 year ago Encounter for Medicare annual wellness exam   [facility] [place], Marzella Schlein, MD   1 year ago COVID-19   [facility] [place], Marzella Schlein, MD       Future Appointments             In 7 months [place], Marzella Schlein, MD [facility]

## 2023-01-27 DIAGNOSIS — J301 Allergic rhinitis due to pollen: Secondary | ICD-10-CM | POA: Diagnosis not present

## 2023-02-03 DIAGNOSIS — J301 Allergic rhinitis due to pollen: Secondary | ICD-10-CM | POA: Diagnosis not present

## 2023-02-10 DIAGNOSIS — J301 Allergic rhinitis due to pollen: Secondary | ICD-10-CM | POA: Diagnosis not present

## 2023-02-18 DIAGNOSIS — Z87891 Personal history of nicotine dependence: Secondary | ICD-10-CM | POA: Diagnosis not present

## 2023-02-18 DIAGNOSIS — C641 Malignant neoplasm of right kidney, except renal pelvis: Secondary | ICD-10-CM | POA: Diagnosis not present

## 2023-02-18 DIAGNOSIS — R918 Other nonspecific abnormal finding of lung field: Secondary | ICD-10-CM | POA: Diagnosis not present

## 2023-02-18 DIAGNOSIS — C3411 Malignant neoplasm of upper lobe, right bronchus or lung: Secondary | ICD-10-CM | POA: Diagnosis not present

## 2023-02-18 DIAGNOSIS — Z7901 Long term (current) use of anticoagulants: Secondary | ICD-10-CM | POA: Diagnosis not present

## 2023-02-18 DIAGNOSIS — C3491 Malignant neoplasm of unspecified part of right bronchus or lung: Secondary | ICD-10-CM | POA: Diagnosis not present

## 2023-02-24 DIAGNOSIS — J301 Allergic rhinitis due to pollen: Secondary | ICD-10-CM | POA: Diagnosis not present

## 2023-03-03 DIAGNOSIS — J301 Allergic rhinitis due to pollen: Secondary | ICD-10-CM | POA: Diagnosis not present

## 2023-03-12 DIAGNOSIS — D225 Melanocytic nevi of trunk: Secondary | ICD-10-CM | POA: Diagnosis not present

## 2023-03-12 DIAGNOSIS — Z85820 Personal history of malignant melanoma of skin: Secondary | ICD-10-CM | POA: Diagnosis not present

## 2023-03-12 DIAGNOSIS — L570 Actinic keratosis: Secondary | ICD-10-CM | POA: Diagnosis not present

## 2023-03-12 DIAGNOSIS — Z85828 Personal history of other malignant neoplasm of skin: Secondary | ICD-10-CM | POA: Diagnosis not present

## 2023-03-12 DIAGNOSIS — Z872 Personal history of diseases of the skin and subcutaneous tissue: Secondary | ICD-10-CM | POA: Diagnosis not present

## 2023-03-17 ENCOUNTER — Ambulatory Visit: Payer: Self-pay

## 2023-03-17 DIAGNOSIS — J301 Allergic rhinitis due to pollen: Secondary | ICD-10-CM | POA: Diagnosis not present

## 2023-03-17 NOTE — Telephone Encounter (Signed)
Message from Phill Myron sent at 03/17/2023 11:05 AM EDT  Summary: Diarrhea, Gas   Diarrhea, Gas  been happening for a while but now it is getting worse  ( gallbladder removed in 2021)         Chief Complaint: diarrhea Symptoms: gas, burping, lower abd soreness Frequency: last Sunday (03/09/23) Pertinent Negatives: Patient denies bloody stools, nausea, vomiting Disposition: []ED /[]Urgent Care (no appt availability in office) / [x]Appointment(In office/virtual)/ [] [facility]/ []Home Care/ []Refused Recommended Disposition /[][facility]/ [] Follow-up with PCP Additional Notes: appt tomorrow  Reason for Disposition  [1] MODERATE diarrhea (e.g., 4-6 times / day more than normal) AND [2] age > 70 years  Answer Assessment - Initial Assessment Questions 1. DIARRHEA SEVERITY: "How bad is the diarrhea?" "How many more stools have you had in the past 24 hours than normal?"    - NO DIARRHEA (SCALE 0)   - MILD (SCALE 1-3): Few loose or mushy BMs; increase of 1-3 stools over normal daily number of stools; mild increase in ostomy output.   -  MODERATE (SCALE 4-7): Increase of 4-6 stools daily over normal; moderate increase in ostomy output.   -  SEVERE (SCALE 8-10; OR "WORST POSSIBLE"): Increase of 7 or more stools daily over normal; moderate increase in ostomy output; incontinence.     moderate 2. ONSET: "When did the diarrhea begin?"      20 21 Sunday  3. BM CONSISTENCY: "How loose or watery is the diarrhea?"      Watery  4. VOMITING: "Are you also vomiting?" If Yes, ask: "How many times in the past 24 hours?"      no 5. ABDOMEN PAIN: "Are you having any abdomen pain?" If Yes, ask: "What does it feel like?" (e.g., crampy, dull, intermittent, constant)      Feels sore felt ill 6. ABDOMEN PAIN SEVERITY: If present, ask: "How bad is the pain?"  (e.g., Scale 1-10; mild, moderate, or severe)   - MILD (1-3): doesn't interfere with normal activities, abdomen soft  and not tender to touch    - MODERATE (4-7): interferes with normal activities or awakens from sleep, abdomen tender to touch    - SEVERE (8-10): excruciating pain, doubled over, unable to do any normal activities       Soreness 2/10- lower abd feels pressure 7. ORAL INTAKE: If vomiting, "Have you been able to drink liquids?" "How much liquids have you had in the past 24 hours?"     Yes- is tolerating 8. HYDRATION: "Any signs of dehydration?" (e.g., dry mouth [not just dry lips], too weak to stand, dizziness, new weight loss) "When did you last urinate?"     Dizziness with bending over-last urinated 20 minutes 9. EXPOSURE: "Have you traveled to a foreign country recently?" "Have you been exposed to anyone with diarrhea?" "Could you have eaten any food that was spoiled?"     N/a 10. ANTIBIOTIC USE: "Are you taking antibiotics now or have you taken antibiotics in the past 2 months?"       no 11. OTHER SYMPTOMS: "Do you have any other symptoms?" (e.g., fever, blood in stool)       Gas and burping  12. PREGNANCY: "Is there any chance you are pregnant?" "When was your last menstrual period?"       N/a  Protocols used: [facility]

## 2023-03-18 ENCOUNTER — Encounter: Payer: Self-pay | Admitting: Family Medicine

## 2023-03-18 ENCOUNTER — Telehealth (INDEPENDENT_AMBULATORY_CARE_PROVIDER_SITE_OTHER): Payer: Medicare Other | Admitting: Family Medicine

## 2023-03-18 DIAGNOSIS — R197 Diarrhea, unspecified: Secondary | ICD-10-CM

## 2023-03-18 NOTE — Progress Notes (Signed)
MyChart Video Visit    Virtual Visit via Video Note   This format is felt to be most appropriate for this patient at this time. Physical exam was limited by quality of the video and audio technology used for the visit.    Patient location: home Provider location: [facility] Persons involved in the visit: patient, provider   I discussed the limitations of evaluation and management by telemedicine and the availability of in person appointments. The patient expressed understanding and agreed to proceed.  Patient: Rita Webb   DOB: 02-15-1946   77 y.o. Female  MRN: [id number] Visit Date: 03/18/2023  Today's healthcare provider: Shirlee Latch, MD   Chief Complaint  Patient presents with   Diarrhea    Started last Sunday 09/1. Gas  been happening for a while but now it is getting worse   Subjective    Diarrhea    HPI     Diarrhea    Additional comments: Started last Sunday 09/1. Gas  been happening for a while but now it is getting worse      Last edited by Marjie Skiff, CMA on 03/18/2023  2:39 PM.      Discussed the use of AI scribe software for clinical note transcription with the patient, who gave verbal consent to proceed.  History of Present Illness   The patient, with a history of diverticulosis, presents with chronic diarrhea, which has been ongoing for years. She reports a recent exacerbation of symptoms, with a particularly severe episode on Sunday. She describes her stools as initially semi-solid, followed by a second bowel movement that is watery. She typically has two bowel movements daily. She also reports excessive gas, bloating, and a sensation of pressure in the lower abdomen, particularly after eating. She denies nausea, vomiting, and blood in the stool. She also mentions a 'catch' or 'stitch' in the lower left side of her abdomen, where she also notices gurgling noises. She had a colonoscopy last year, which revealed  multiple diverticula in the sigmoid colon.        Medications: Outpatient Medications Prior to Visit  Medication Sig   acetaminophen (TYLENOL) 325 MG tablet Take 650 mg by mouth every 6 (six) hours as needed.   B COMPLEX-C PO Take 1 tablet by mouth daily.    Calcium Carbonate-Vitamin D 600-200 MG-UNIT CAPS Take 2 capsules by mouth daily.    Cholecalciferol 1000 UNITS capsule Take 1,000 Units by mouth daily.    clotrimazole-betamethasone (LOTRISONE) cream Apply 1 Application topically daily.   ELIQUIS 5 MG TABS tablet TAKE 1 TABLET BY MOUTH TWICE A DAY   fluticasone (FLONASE) 50 MCG/ACT nasal spray Place into both nostrils.   latanoprost (XALATAN) 0.005 % ophthalmic solution SMARTSIG:In Eye(s)   mometasone (ELOCON) 0.1 % lotion Apply topically.   MULTIPLE VITAMIN PO Take 1 tablet by mouth daily.    Omega-3 Fatty Acids (FISH OIL) 1200 MG CAPS Take 1 capsule by mouth daily.    psyllium (REGULOID) 0.52 G capsule Take 2 capsules by mouth 3 (three) times daily.   sertraline (ZOLOFT) 50 MG tablet TAKE ONE TABLET BY MOUTH ONCE A DAY   simvastatin (ZOCOR) 10 MG tablet TAKE ONE TABLET BY MOUTH AT BEDTIME   alendronate (FOSAMAX) 70 MG tablet Take 1 tablet (70 mg total) by mouth every 7 (seven) days. Take with a full glass of water on an empty stomach. (Patient not taking: Reported on 05/09/2022)   No facility-administered medications prior to visit.  Review of Systems  Gastrointestinal:  Positive for diarrhea.   per HPI      Objective    There were no vitals taken for this visit.      Physical Exam Constitutional:      General: She is not in acute distress.    Appearance: Normal appearance.  HENT:     Head: Normocephalic.  Pulmonary:     Effort: Pulmonary effort is normal. No respiratory distress.  Neurological:     Mental Status: She is alert and oriented to person, place, and time. Mental status is at baseline.        Assessment & Plan     Problem List Items  Addressed This Visit   None Visit Diagnoses     Diarrhea, unspecified type    -  Primary   Relevant Orders   GI Profile, Stool, PCR           Acute on Chronic Diarrhea Longstanding history of diarrhea, with recent exacerbation. No nausea, vomiting, or blood in stool. Stool consistency varies from semi-solid to watery. History of diverticulosis noted on previous colonoscopy. No acute abdominal pain suggestive of diverticulitis. -Collect stool sample for infectious workup to rule out possible infectious etiology.  Diverticulosis History of diverticulosis noted on previous colonoscopy. Recent exacerbation of chronic diarrhea and left-sided abdominal discomfort. No fever or severe abdominal pain suggestive of acute diverticulitis. -Monitor symptoms closely. Consider further evaluation if symptoms worsen or persist.  Dietary Habits Noted consumption of high fiber foods (salads) prior to recent exacerbation of diarrhea. -Consider moderation of high fiber foods, particularly raw vegetables, to see if symptoms improve.       Return if symptoms worsen or fail to improve.     I discussed the assessment and treatment plan with the patient. The patient was provided an opportunity to ask questions and all were answered. The patient agreed with the plan and demonstrated an understanding of the instructions.   The patient was advised to call back or seek an in-person evaluation if the symptoms worsen or if the condition fails to improve as anticipated.    Shirlee Latch, MD [facility] [phone] (phone) [phone] (fax)  [facility]

## 2023-03-19 DIAGNOSIS — R197 Diarrhea, unspecified: Secondary | ICD-10-CM | POA: Diagnosis not present

## 2023-03-22 LAB — GI PROFILE, STOOL, PCR

## 2023-03-26 DIAGNOSIS — J301 Allergic rhinitis due to pollen: Secondary | ICD-10-CM | POA: Diagnosis not present

## 2023-04-07 DIAGNOSIS — J301 Allergic rhinitis due to pollen: Secondary | ICD-10-CM | POA: Diagnosis not present

## 2023-04-11 DIAGNOSIS — C641 Malignant neoplasm of right kidney, except renal pelvis: Secondary | ICD-10-CM | POA: Diagnosis not present

## 2023-04-11 DIAGNOSIS — Z7901 Long term (current) use of anticoagulants: Secondary | ICD-10-CM | POA: Diagnosis not present

## 2023-04-11 DIAGNOSIS — C3491 Malignant neoplasm of unspecified part of right bronchus or lung: Secondary | ICD-10-CM | POA: Diagnosis not present

## 2023-04-11 DIAGNOSIS — Z09 Encounter for follow-up examination after completed treatment for conditions other than malignant neoplasm: Secondary | ICD-10-CM | POA: Diagnosis not present

## 2023-04-11 DIAGNOSIS — Z86718 Personal history of other venous thrombosis and embolism: Secondary | ICD-10-CM | POA: Diagnosis not present

## 2023-04-14 ENCOUNTER — Other Ambulatory Visit: Payer: Self-pay | Admitting: Family Medicine

## 2023-04-14 DIAGNOSIS — J301 Allergic rhinitis due to pollen: Secondary | ICD-10-CM | POA: Diagnosis not present

## 2023-04-14 DIAGNOSIS — Z1231 Encounter for screening mammogram for malignant neoplasm of breast: Secondary | ICD-10-CM

## 2023-04-14 DIAGNOSIS — E7800 Pure hypercholesterolemia, unspecified: Secondary | ICD-10-CM

## 2023-04-16 DIAGNOSIS — J300 Vasomotor rhinitis: Secondary | ICD-10-CM | POA: Diagnosis not present

## 2023-04-16 DIAGNOSIS — J309 Allergic rhinitis, unspecified: Secondary | ICD-10-CM | POA: Diagnosis not present

## 2023-04-21 DIAGNOSIS — J301 Allergic rhinitis due to pollen: Secondary | ICD-10-CM | POA: Diagnosis not present

## 2023-04-28 DIAGNOSIS — J301 Allergic rhinitis due to pollen: Secondary | ICD-10-CM | POA: Diagnosis not present

## 2023-05-05 DIAGNOSIS — J301 Allergic rhinitis due to pollen: Secondary | ICD-10-CM | POA: Diagnosis not present

## 2023-05-05 DIAGNOSIS — H401221 Low-tension glaucoma, left eye, mild stage: Secondary | ICD-10-CM | POA: Diagnosis not present

## 2023-05-05 DIAGNOSIS — H401213 Low-tension glaucoma, right eye, severe stage: Secondary | ICD-10-CM | POA: Diagnosis not present

## 2023-05-07 ENCOUNTER — Other Ambulatory Visit: Payer: Self-pay | Admitting: Unknown Physician Specialty

## 2023-05-07 DIAGNOSIS — R040 Epistaxis: Secondary | ICD-10-CM | POA: Diagnosis not present

## 2023-05-08 ENCOUNTER — Encounter: Payer: Self-pay | Admitting: Unknown Physician Specialty

## 2023-05-09 ENCOUNTER — Encounter: Payer: Self-pay | Admitting: Unknown Physician Specialty

## 2023-05-09 ENCOUNTER — Encounter
Admission: RE | Disposition: A | Discharge status: Home / Self Care | Payer: Self-pay | Source: Home / Self Care | Attending: Unknown Physician Specialty

## 2023-05-09 ENCOUNTER — Ambulatory Visit: Payer: Medicare Other | Admitting: General Practice

## 2023-05-09 ENCOUNTER — Ambulatory Visit
Admission: RE | Admit: 2023-05-09 | Discharge: 2023-05-09 | Disposition: A | Discharge status: Home / Self Care | Payer: Medicare Other | Attending: Unknown Physician Specialty | Admitting: Unknown Physician Specialty

## 2023-05-09 ENCOUNTER — Other Ambulatory Visit: Payer: Self-pay

## 2023-05-09 DIAGNOSIS — Z87891 Personal history of nicotine dependence: Secondary | ICD-10-CM | POA: Diagnosis not present

## 2023-05-09 DIAGNOSIS — R040 Epistaxis: Secondary | ICD-10-CM | POA: Insufficient documentation

## 2023-05-09 DIAGNOSIS — J342 Deviated nasal septum: Secondary | ICD-10-CM | POA: Insufficient documentation

## 2023-05-09 DIAGNOSIS — E785 Hyperlipidemia, unspecified: Secondary | ICD-10-CM | POA: Diagnosis not present

## 2023-05-09 HISTORY — DX: Presence of dental prosthetic device (complete) (partial): Z97.2

## 2023-05-09 HISTORY — DX: Dizziness and giddiness: R42

## 2023-05-09 HISTORY — PX: NASAL HEMORRHAGE CONTROL: SHX287

## 2023-05-09 HISTORY — PX: NASAL ENDOSCOPY: SHX6577

## 2023-05-09 SURGERY — EXAM UNDER ANESTHESIA
Anesthesia: General | Laterality: Bilateral

## 2023-05-09 MED ORDER — PROPOFOL 10 MG/ML IV BOLUS
INTRAVENOUS | Status: DC | PRN
  Administered 2023-05-09: 150 mg via INTRAVENOUS

## 2023-05-09 MED ORDER — FENTANYL CITRATE (PF) 100 MCG/2ML IJ SOLN
INTRAMUSCULAR | Status: DC | PRN
  Administered 2023-05-09 (×2): 50 ug via INTRAVENOUS

## 2023-05-09 MED ORDER — LIDOCAINE HCL (PF) 2 % IJ SOLN
INTRAMUSCULAR | Status: AC
  Filled 2023-05-09: qty 5

## 2023-05-09 MED ORDER — PHENYLEPHRINE HCL 0.5 % NA SOLN: NASAL | Status: DC | PRN

## 2023-05-09 MED ORDER — DEXAMETHASONE SODIUM PHOSPHATE 4 MG/ML IJ SOLN
INTRAMUSCULAR | Status: AC
  Filled 2023-05-09: qty 1

## 2023-05-09 MED ORDER — SUCCINYLCHOLINE CHLORIDE 200 MG/10ML IV SOSY
PREFILLED_SYRINGE | INTRAVENOUS | Status: AC
  Filled 2023-05-09: qty 10

## 2023-05-09 MED ORDER — ONDANSETRON HCL 4 MG/2ML IJ SOLN
INTRAMUSCULAR | Status: AC
  Filled 2023-05-09: qty 2

## 2023-05-09 MED ORDER — OXYMETAZOLINE HCL 0.05 % NA SOLN
NASAL | Status: AC
  Filled 2023-05-09: qty 30

## 2023-05-09 MED ORDER — MIDAZOLAM HCL 2 MG/2ML IJ SOLN
INTRAMUSCULAR | Status: AC
  Filled 2023-05-09: qty 2

## 2023-05-09 MED ORDER — ONDANSETRON HCL 4 MG/2ML IJ SOLN
INTRAMUSCULAR | Status: DC | PRN
  Administered 2023-05-09: 4 mg via INTRAVENOUS

## 2023-05-09 MED ORDER — DEXAMETHASONE SODIUM PHOSPHATE 4 MG/ML IJ SOLN
INTRAMUSCULAR | Status: DC | PRN
  Administered 2023-05-09: 8 mg via INTRAVENOUS

## 2023-05-09 MED ORDER — SILVER NITRATE-POT NITRATE 75-25 % EX MISC
CUTANEOUS | Status: DC | PRN
  Administered 2023-05-09 (×2): 1

## 2023-05-09 MED ORDER — FENTANYL CITRATE (PF) 100 MCG/2ML IJ SOLN
INTRAMUSCULAR | Status: AC
  Filled 2023-05-09: qty 2

## 2023-05-09 MED ORDER — SODIUM CHLORIDE 0.9% FLUSH: 10.0000 mL | Freq: Two times a day (BID) | INTRAVENOUS | Status: DC

## 2023-05-09 MED ORDER — SUCCINYLCHOLINE CHLORIDE 200 MG/10ML IV SOSY
PREFILLED_SYRINGE | INTRAVENOUS | Status: DC | PRN
  Administered 2023-05-09: 100 mg via INTRAVENOUS

## 2023-05-09 MED ORDER — DEXAMETHASONE SODIUM PHOSPHATE 4 MG/ML IJ SOLN
INTRAMUSCULAR | Status: AC
  Filled 2023-05-09: qty 2

## 2023-05-09 MED ORDER — OXYMETAZOLINE HCL 0.05 % NA SOLN
6.0000 | NASAL | Status: AC
  Administered 2023-05-09: 6 via NASAL

## 2023-05-09 MED ORDER — MIDAZOLAM HCL 5 MG/5ML IJ SOLN
INTRAMUSCULAR | Status: DC | PRN
  Administered 2023-05-09: 2 mg via INTRAVENOUS

## 2023-05-09 MED ORDER — LIDOCAINE HCL (CARDIAC) PF 100 MG/5ML IV SOSY
PREFILLED_SYRINGE | INTRAVENOUS | Status: DC | PRN
  Administered 2023-05-09: 50 mg via INTRAVENOUS

## 2023-05-09 SURGICAL SUPPLY — 30 items
APL FBRTP 3 NS LF CTTN WD (MISCELLANEOUS)
APPLICATOR COTTON TIP 3IN (MISCELLANEOUS) IMPLANT
BALL CTTN LRG ABS STRL LF (GAUZE/BANDAGES/DRESSINGS)
BASIN GRAD PLASTIC 32OZ STRL (MISCELLANEOUS) ×2 IMPLANT
BLADE MYR LANCE NRW W/HDL (BLADE) IMPLANT
CANISTER SUCT 1200ML W/VALVE (MISCELLANEOUS) ×2 IMPLANT
CATH ROBINSON RED A/P 8FR (CATHETERS) IMPLANT
COAG SUCTION FOOTSWITCH 10FR (SUCTIONS) ×2 IMPLANT
COTTONBALL LRG STERILE PKG (GAUZE/BANDAGES/DRESSINGS) IMPLANT
COVER MAYO STAND STRL (DRAPES) ×2 IMPLANT
CUP MEDICINE 2OZ PLAST GRAD ST (MISCELLANEOUS) ×2 IMPLANT
DRAPE HEAD BAR (DRAPES) ×2 IMPLANT
DRAPE SHEET LG 3/4 BI-LAMINATE (DRAPES) ×2 IMPLANT
ELECT REM PT RETURN 9FT ADLT (ELECTROSURGICAL) ×1
ELECTRODE REM PT RTRN 9FT ADLT (ELECTROSURGICAL) ×2 IMPLANT
GAUZE SPONGE 4X4 12PLY STRL (GAUZE/BANDAGES/DRESSINGS) ×2 IMPLANT
GLOVE SURG ENC TEXT LTX SZ7.5 (GLOVE) ×4 IMPLANT
GOWN STRL REUS W/ TWL LRG LVL3 (GOWN DISPOSABLE) IMPLANT
GOWN STRL REUS W/TWL LRG LVL3 (GOWN DISPOSABLE)
KIT TURNOVER KIT A (KITS) ×2 IMPLANT
NDL HYPO 25GX1X1/2 BEV (NEEDLE) ×2 IMPLANT
NEEDLE HYPO 25GX1X1/2 BEV (NEEDLE) ×1 IMPLANT
NS IRRIG 500ML POUR BTL (IV SOLUTION) ×2 IMPLANT
PACK ENT CUSTOM (PACKS) IMPLANT
SPONGE NEURO XRAY DETECT 1X3 (DISPOSABLE) ×2 IMPLANT
STRAP BODY AND KNEE 60X3 (MISCELLANEOUS) ×2 IMPLANT
SYR 10ML LL (SYRINGE) ×2 IMPLANT
SYR BULB IRRIG 60ML STRL (SYRINGE) ×2 IMPLANT
TOWEL OR 17X26 4PK STRL BLUE (TOWEL DISPOSABLE) ×2 IMPLANT
TUBING SUCTION CONN 0.25 STRL (TUBING) ×2 IMPLANT

## 2023-05-09 NOTE — Anesthesia Postprocedure Evaluation (Signed)
Anesthesia Post Note  Patient: Rita Webb  Procedure(s) Performed: EXAM UNDER ANESTHESIA (Bilateral) EPISTAXIS CONTROL (Bilateral) NASAL ENDOSCOPY (Bilateral)  Patient location during evaluation: PACU Anesthesia Type: General Level of consciousness: awake and alert Pain management: pain level controlled Vital Signs Assessment: post-procedure vital signs reviewed and stable Respiratory status: spontaneous breathing, nonlabored ventilation, respiratory function stable and patient connected to nasal cannula oxygen Cardiovascular status: stable and blood pressure returned to baseline Postop Assessment: no apparent nausea or vomiting Anesthetic complications: no  No notable events documented.   Last Vitals:  Vitals:   05/09/23 0938 05/09/23 1135  BP: (!) 138/57 123/63  Pulse: 91 70  Resp: 19 18  Temp: 36.6 C 36.6 C  SpO2: 97% 100%    Last Pain:  Vitals:   05/09/23 1135  TempSrc:   PainSc: 0-No pain                 Stephanie Coup

## 2023-05-09 NOTE — Transfer of Care (Signed)
Immediate Anesthesia Transfer of Care Note  Patient: Rita Webb  Procedure(s) Performed: EXAM UNDER ANESTHESIA (Bilateral) EPISTAXIS CONTROL (Bilateral) NASAL ENDOSCOPY (Bilateral)  Patient Location: PACU  Anesthesia Type: General ETT, General  Level of Consciousness: awake, alert  and patient cooperative  Airway and Oxygen Therapy: Patient Spontanous Breathing and Patient connected to supplemental oxygen  Post-op Assessment: Post-op Vital signs reviewed, Patient's Cardiovascular Status Stable, Respiratory Function Stable, Patent Airway and No signs of Nausea or vomiting  Post-op Vital Signs: Reviewed and stable  Complications: No notable events documented.

## 2023-05-09 NOTE — Op Note (Signed)
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 Fairbairn was identified in the holding area taken the operating placed in supine position.  After general trach anesthesia a topical anesthetic of phenylephrine lidocaine solution was placed on cottonoid pledgets and introduced into each nostril.  Patient was then draped usual fashion for sinus surgery.  The cotton pledgets were removed the 0 degree endoscope was introduced into the left nostril.  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  This is what I saw in the clinic as well.  Photodocumentation was performed.  I gently placed the #8 suction cautery to the left nostril as well and under direct vision with the endoscope the suction cautery was used to cauterize this hemangioma.  With touching of the hemangioma there was brisk bleeding, I think this is the actual site that was bleeding for her at home.  Suction cautery was used to cauterize this hemangioma entirely.  There was no further bleeding.  I removed the endoscope there were some several small superficial vessels of the anterior septum which I cauterized using the silver nitrate and on the inferior aspect of the inferior turbinate.  But I do not think these are the main source of the bleeding I think it was a hemangioma.  All pledgets were removed and no active bleeding the patient was returned to anesthesia where she was taken to cover room stable condition.  Dispo:   Good  Plan: Discharge to  home recommend she restart her Eliquis on Monday.  Davina Poke  05/09/2023 11:24 AM

## 2023-05-09 NOTE — Anesthesia Procedure Notes (Signed)
Procedure Name: Intubation Date/Time: 05/09/2023 11:05 AM  Performed by: Domenic Moras, CRNAPre-anesthesia Checklist: Patient identified, Emergency Drugs available, Suction available and Patient being monitored Patient Re-evaluated:Patient Re-evaluated prior to induction Oxygen Delivery Method: Circle system utilized Preoxygenation: Pre-oxygenation with 100% oxygen Induction Type: IV induction Ventilation: Mask ventilation without difficulty Laryngoscope Size: Mac and 3 Grade View: Grade I Tube type: Oral Rae Tube size: 7.0 mm Number of attempts: 1 Airway Equipment and Method: Stylet and Oral airway Placement Confirmation: ETT inserted through vocal cords under direct vision, positive ETCO2 and breath sounds checked- equal and bilateral Secured at: 20 cm Tube secured with: Tape Dental Injury: Teeth and Oropharynx as per pre-operative assessment

## 2023-05-09 NOTE — H&P (Signed)
The patient's history has been reviewed, patient examined, no change in status, stable for surgery.  Questions were answered to the patients satisfaction.  

## 2023-05-09 NOTE — Anesthesia Preprocedure Evaluation (Signed)
Anesthesia Evaluation  Patient identified by MRN, date of birth, ID band Patient awake    Reviewed: Allergy & Precautions, NPO status , Patient's Chart, lab work & pertinent test results  Airway Mallampati: II  TM Distance: >3 FB Neck ROM: full  Mouth opening: Limited Mouth Opening  Dental  (+) Dental Advidsory Given, Edentulous Upper, Edentulous Lower   Pulmonary neg pulmonary ROS, former smoker Hx of lobectomy in 2023.   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) No date: Hyperlipidemia 2022: Lung cancer (HCC) No date: Renal mass No date: Vertigo No date: Wears dentures     Comment:  full upper and lower  Past Surgical History: No date: ADRENALECTOMY No date: CHOLECYSTECTOMY 09/25/2021: COLONOSCOPY WITH PROPOFOL; N/A     Comment:  Procedure: COLONOSCOPY WITH PROPOFOL;  Surgeon: Toney Reil, MD;  Location: [facility] ENDOSCOPY;  Service:               Gastroenterology;  Laterality: N/A; 05/2020: EYE SURGERY; Left 1996: GANGLION CYST EXCISION; Right     Comment:  wrist 08/31/2020: IVC FILTER REMOVAL; N/A     Comment:  Procedure: IVC FILTER REMOVAL;  Surgeon: Annice Needy,               MD;  Location: [facility] INVASIVE CV LAB;  Service:               Cardiovascular;  Laterality: N/A; 11/10/2019: LAPAROSCOPIC NEPHRECTOMY; Right     Comment:  [facility] 02/05/2021: LUNG CANCER SURGERY; Right     Comment:  [facility] Dr.  Ewing Schlein 08/2011: MELANOMA EXCISION 10/14/2019: PERIPHERAL VASCULAR THROMBECTOMY; Left     Comment:  Procedure: LEFT LOWER EXTREMITY VENOUS LYSIS /               THROMBECTOMY WITH IVF FILTER;  Surgeon: Annice Needy, MD;              Location: [facility] INVASIVE CV LAB;  Service: Cardiovascular;              Laterality: Left; No date: SPINE SURGERY  BMI    Body Mass Index: 19.50 kg/m      Reproductive/Obstetrics negative OB ROS                             Anesthesia Physical Anesthesia Plan  ASA: 3  Anesthesia Plan: General ETT and General   Post-op Pain Management:    Induction: Intravenous  PONV Risk Score and Plan: 3 and Ondansetron, Dexamethasone and Midazolam  Airway Management Planned: Oral ETT  Additional Equipment:   Intra-op Plan:   Post-operative Plan: Extubation in OR  Informed Consent: I have reviewed the patients History and Physical, chart, labs and discussed the procedure including the risks, benefits and alternatives for the proposed anesthesia with the patient or authorized representative who  has indicated his/her understanding and acceptance.     Dental Advisory Given  Plan Discussed with: Anesthesiologist, CRNA and Surgeon  Anesthesia Plan Comments: (Patient consented for risks of anesthesia including but not limited to:  - adverse reactions to medications - damage to eyes, teeth, lips or other oral mucosa - nerve damage due to positioning  - sore throat or hoarseness - Damage to heart, brain, nerves, lungs, other parts of body or loss of life  Patient voiced understanding and assent.)       Anesthesia Quick Evaluation

## 2023-05-11 ENCOUNTER — Encounter: Payer: Self-pay | Admitting: Unknown Physician Specialty

## 2023-05-14 DIAGNOSIS — J340 Abscess, furuncle and carbuncle of nose: Secondary | ICD-10-CM | POA: Diagnosis not present

## 2023-05-14 DIAGNOSIS — R040 Epistaxis: Secondary | ICD-10-CM | POA: Diagnosis not present

## 2023-05-19 ENCOUNTER — Ambulatory Visit
Admission: RE | Admit: 2023-05-19 | Discharge: 2023-05-19 | Disposition: A | Discharge status: Home / Self Care | Payer: Medicare Other | Source: Ambulatory Visit | Attending: Family Medicine | Admitting: Family Medicine

## 2023-05-19 DIAGNOSIS — Z1231 Encounter for screening mammogram for malignant neoplasm of breast: Secondary | ICD-10-CM | POA: Diagnosis not present

## 2023-05-19 DIAGNOSIS — R040 Epistaxis: Secondary | ICD-10-CM | POA: Diagnosis not present

## 2023-05-26 DIAGNOSIS — J301 Allergic rhinitis due to pollen: Secondary | ICD-10-CM | POA: Diagnosis not present

## 2023-06-02 DIAGNOSIS — J301 Allergic rhinitis due to pollen: Secondary | ICD-10-CM | POA: Diagnosis not present

## 2023-06-09 DIAGNOSIS — J301 Allergic rhinitis due to pollen: Secondary | ICD-10-CM | POA: Diagnosis not present

## 2023-06-16 DIAGNOSIS — J301 Allergic rhinitis due to pollen: Secondary | ICD-10-CM | POA: Diagnosis not present

## 2023-06-18 DIAGNOSIS — J301 Allergic rhinitis due to pollen: Secondary | ICD-10-CM | POA: Diagnosis not present

## 2023-06-23 DIAGNOSIS — J301 Allergic rhinitis due to pollen: Secondary | ICD-10-CM | POA: Diagnosis not present

## 2023-06-30 DIAGNOSIS — J301 Allergic rhinitis due to pollen: Secondary | ICD-10-CM | POA: Diagnosis not present

## 2023-07-07 DIAGNOSIS — J301 Allergic rhinitis due to pollen: Secondary | ICD-10-CM | POA: Diagnosis not present

## 2023-07-14 DIAGNOSIS — J301 Allergic rhinitis due to pollen: Secondary | ICD-10-CM | POA: Diagnosis not present

## 2023-07-16 ENCOUNTER — Other Ambulatory Visit: Payer: Self-pay | Admitting: Family Medicine

## 2023-07-21 DIAGNOSIS — J301 Allergic rhinitis due to pollen: Secondary | ICD-10-CM | POA: Diagnosis not present

## 2023-07-28 DIAGNOSIS — J301 Allergic rhinitis due to pollen: Secondary | ICD-10-CM | POA: Diagnosis not present

## 2023-08-04 DIAGNOSIS — J301 Allergic rhinitis due to pollen: Secondary | ICD-10-CM | POA: Diagnosis not present

## 2023-08-11 DIAGNOSIS — J301 Allergic rhinitis due to pollen: Secondary | ICD-10-CM | POA: Diagnosis not present

## 2023-08-18 DIAGNOSIS — J301 Allergic rhinitis due to pollen: Secondary | ICD-10-CM | POA: Diagnosis not present

## 2023-08-25 DIAGNOSIS — J301 Allergic rhinitis due to pollen: Secondary | ICD-10-CM | POA: Diagnosis not present

## 2023-08-26 DIAGNOSIS — Z8616 Personal history of COVID-19: Secondary | ICD-10-CM | POA: Diagnosis not present

## 2023-08-26 DIAGNOSIS — C7802 Secondary malignant neoplasm of left lung: Secondary | ICD-10-CM | POA: Diagnosis not present

## 2023-08-26 DIAGNOSIS — C641 Malignant neoplasm of right kidney, except renal pelvis: Secondary | ICD-10-CM | POA: Diagnosis not present

## 2023-08-26 DIAGNOSIS — C3411 Malignant neoplasm of upper lobe, right bronchus or lung: Secondary | ICD-10-CM | POA: Diagnosis not present

## 2023-08-26 DIAGNOSIS — R918 Other nonspecific abnormal finding of lung field: Secondary | ICD-10-CM | POA: Diagnosis not present

## 2023-08-26 DIAGNOSIS — K529 Noninfective gastroenteritis and colitis, unspecified: Secondary | ICD-10-CM | POA: Diagnosis not present

## 2023-08-26 DIAGNOSIS — Z87891 Personal history of nicotine dependence: Secondary | ICD-10-CM | POA: Diagnosis not present

## 2023-08-26 DIAGNOSIS — R040 Epistaxis: Secondary | ICD-10-CM | POA: Diagnosis not present

## 2023-08-26 DIAGNOSIS — C3491 Malignant neoplasm of unspecified part of right bronchus or lung: Secondary | ICD-10-CM | POA: Diagnosis not present

## 2023-09-01 DIAGNOSIS — H401221 Low-tension glaucoma, left eye, mild stage: Secondary | ICD-10-CM | POA: Diagnosis not present

## 2023-09-01 DIAGNOSIS — J301 Allergic rhinitis due to pollen: Secondary | ICD-10-CM | POA: Diagnosis not present

## 2023-09-01 DIAGNOSIS — H401213 Low-tension glaucoma, right eye, severe stage: Secondary | ICD-10-CM | POA: Diagnosis not present

## 2023-09-02 ENCOUNTER — Telehealth: Payer: Self-pay | Admitting: Family Medicine

## 2023-09-02 MED ORDER — DIAZEPAM 5 MG PO TABS: ORAL_TABLET | ORAL | 0 refills | Status: DC

## 2023-09-02 NOTE — Telephone Encounter (Signed)
 LMTCB. Ok for [facility] to advise patient.

## 2023-09-02 NOTE — Telephone Encounter (Signed)
 Oh ok understood.  Would try pepcid OTC BID. Can use Tums or pepto prn

## 2023-09-02 NOTE — Telephone Encounter (Signed)
 Copied from CRM [id number]. Topic: General - Other >> Sep 02, 2023 10:19 AM Lynford Humphrey wrote: Reason for CRM: Having biopsy mar 3rd on Monday patient is super nervous has knots in stomach and wants to know if there is anything she can't take to ease nerves and anxiety. Looking for a callback as soon as possible

## 2023-09-02 NOTE — Telephone Encounter (Signed)
 Patient said what she was referring to is that when she feels anxious she gets a knot and nauseas and wanted to see if she can take Pepto Bismol or something related to it to get her through until the appointment.She reports that she takes Zoloft already. Her appointment is March 03,2025.

## 2023-09-04 NOTE — Telephone Encounter (Signed)
 Detailed voicemail left per [facility]

## 2023-09-08 DIAGNOSIS — C641 Malignant neoplasm of right kidney, except renal pelvis: Secondary | ICD-10-CM | POA: Diagnosis not present

## 2023-09-08 DIAGNOSIS — C3491 Malignant neoplasm of unspecified part of right bronchus or lung: Secondary | ICD-10-CM | POA: Diagnosis not present

## 2023-09-08 DIAGNOSIS — C3432 Malignant neoplasm of lower lobe, left bronchus or lung: Secondary | ICD-10-CM | POA: Diagnosis not present

## 2023-09-08 DIAGNOSIS — R918 Other nonspecific abnormal finding of lung field: Secondary | ICD-10-CM | POA: Diagnosis not present

## 2023-09-08 DIAGNOSIS — C7802 Secondary malignant neoplasm of left lung: Secondary | ICD-10-CM | POA: Diagnosis not present

## 2023-09-11 ENCOUNTER — Ambulatory Visit: Payer: Medicare Other | Admitting: Family Medicine

## 2023-09-11 ENCOUNTER — Encounter: Payer: Self-pay | Admitting: Family Medicine

## 2023-09-11 VITALS — BP 114/52 | HR 59 | Ht 65.0 in | Wt 112.5 lb

## 2023-09-11 DIAGNOSIS — Z0000 Encounter for general adult medical examination without abnormal findings: Secondary | ICD-10-CM | POA: Diagnosis not present

## 2023-09-11 DIAGNOSIS — R634 Abnormal weight loss: Secondary | ICD-10-CM

## 2023-09-11 DIAGNOSIS — R627 Adult failure to thrive: Secondary | ICD-10-CM

## 2023-09-11 DIAGNOSIS — E7800 Pure hypercholesterolemia, unspecified: Secondary | ICD-10-CM | POA: Diagnosis not present

## 2023-09-11 DIAGNOSIS — K529 Noninfective gastroenteritis and colitis, unspecified: Secondary | ICD-10-CM | POA: Diagnosis not present

## 2023-09-11 DIAGNOSIS — F4322 Adjustment disorder with anxiety: Secondary | ICD-10-CM

## 2023-09-11 MED ORDER — COLESTIPOL HCL 1 G PO TABS: 1.0000 g | ORAL_TABLET | Freq: Two times a day (BID) | ORAL | 1 refills | Status: DC

## 2023-09-11 MED ORDER — CLONAZEPAM 0.5 MG PO TABS: 0.2500 mg | ORAL_TABLET | Freq: Two times a day (BID) | ORAL | 1 refills | Status: DC | PRN

## 2023-09-11 NOTE — Assessment & Plan Note (Signed)
 Unintentional weight loss of approximately 9 pounds since October. Contributing factors include chronic diarrhea, anxiety, and decreased appetite during periods of high stress. Discussed adding nutritional supplements and high-calorie, low-volume foods. Consider Remeron for weight gain if needed in the future. - Recommend nutritional supplements once or twice daily - Encourage high-calorie, low-volume foods (e.g., nut butters) - Consider Remeron for weight gain if needed in the future

## 2023-09-11 NOTE — Assessment & Plan Note (Signed)
 Chronic diarrhea, primarily in the mornings, with initial normal stool followed by watery stool. Symptoms have persisted for over six months. Previous tests ruled out bacterial infections. 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 Discussed colestipol to bind bile acids and reduce diarrhea, with minimal side effects. If ineffective, consider switching sertraline, though not ideal during current high-stress period. GI referral for possible endoscopy if symptoms persist. - Prescribe colestipol, 1 pill twice daily - Reassess in one month for effectiveness - Consider switching sertraline if colestipol is ineffective - Refer to GI for possible scope if symptoms persist

## 2023-09-11 NOTE — Progress Notes (Signed)
 Annual Wellness Visit     Patient: Rita Webb, Female    DOB: 1945-08-26, 78 y.o.   MRN: [id number] Visit Date: 09/11/2023  Today's Provider: Shirlee Latch, MD   Chief Complaint  Patient presents with   Annual Exam    Last completed n/a Diet -  Low carb Exercise - walking daily a mile  Feeling - well Sleeping - alright but has a hard time going back if she wakes up to go to restroom Concerns - Diarrhea daily X more than 2 months associated with belching. Previously seen for but no changes. Would like to have bandage removed that was placed on back after biopsy and make sure everything is okay.  Would like to see if she could have a rx for klonopin for as needed anxiety    Care Management    Tdap. [facility]. No record in [facility] advised to contact pharmacy for records   Subjective    Rita Webb is a 78 y.o. female who presents today for her Annual Wellness Visit.  Discussed the use of AI scribe software for clinical note transcription with the patient, who gave verbal consent to proceed.  History of Present Illness   The patient, with a history of gallbladder removal, presents with chronic diarrhea that has been ongoing for at least a year. The diarrhea is described as watery, occurring primarily in the mornings, sometimes immediately upon waking. The patient reports that the first bowel movement of the day can be normal, but subsequent movements are like "running water." The patient denies any blood or pain associated with the bowel movements. The patient also reports significant weight loss, dropping from 121lbs to 112lbs. The patient attributes the weight loss to a combination of the chronic diarrhea and decreased appetite due to anxiety.  The patient's anxiety has been heightened recently due to a growth that was discovered in August and has increased in size from 4mm to 9mm over six months. The patient underwent a  biopsy for this growth and is currently awaiting the results. The anxiety manifests as physical symptoms, including stomach discomfort and tension in the neck. The patient reports that the anxiety can be so severe that it affects her appetite and ability to eat. The patient is currently taking sertraline for anxiety management.             Medications: Outpatient Medications Prior to Visit  Medication Sig   acetaminophen (TYLENOL) 325 MG tablet Take 650 mg by mouth every 6 (six) hours as needed.   B COMPLEX-C PO Take 1 tablet by mouth daily.    Calcium Carbonate-Vitamin D 600-200 MG-UNIT CAPS Take 2 capsules by mouth daily.    Cholecalciferol 1000 UNITS capsule Take 1,000 Units by mouth daily.    clotrimazole-betamethasone (LOTRISONE) cream Apply 1 Application topically daily.   dorzolamide-timolol (COSOPT) 2-0.5 % ophthalmic solution Place 1 drop into both eyes 2 (two) times daily.   ELIQUIS 5 MG TABS tablet TAKE 1 TABLET BY MOUTH TWICE A DAY   latanoprost (XALATAN) 0.005 % ophthalmic solution SMARTSIG:In Eye(s)   mometasone (ELOCON) 0.1 % lotion Apply topically.   MULTIPLE VITAMIN PO Take 1 tablet by mouth daily.    Omega-3 Fatty Acids (FISH OIL) 1200 MG CAPS Take 1 capsule by mouth daily.    sertraline (ZOLOFT) 50 MG tablet TAKE ONE TABLET BY MOUTH ONCE A DAY   simvastatin (ZOCOR) 10 MG tablet TAKE ONE TABLET BY  MOUTH AT BEDTIME   [DISCONTINUED] alendronate (FOSAMAX) 70 MG tablet Take 1 tablet (70 mg total) by mouth every 7 (seven) days. Take with a full glass of water on an empty stomach. (Patient not taking: Reported on 05/09/2022)   [DISCONTINUED] brimonidine (ALPHAGAN P) 0.1 % SOLN Place into both eyes 2 (two) times daily. (Patient not taking: Reported on 09/11/2023)   [DISCONTINUED] diazepam (VALIUM) 5 MG tablet Take 1 tab PO 30 min prior to procedure. May repeat in 1 hour if not controlled. (Patient not taking: Reported on 09/11/2023)   [DISCONTINUED] fluticasone (FLONASE) 50 MCG/ACT  nasal spray Place into both nostrils. (Patient not taking: Reported on 09/11/2023)   [DISCONTINUED] psyllium (REGULOID) 0.52 G capsule Take 2 capsules by mouth 3 (three) times daily. (Patient not taking: Reported on 09/11/2023)   No facility-administered medications prior to visit.    Allergies  Allergen Reactions   Cefuroxime Diarrhea    Patient Care Team: Erasmo Downer, MD as PCP - General (Family Medicine) Blair Promise, OD (Optometry) Tyrone Schimke, MD as Consulting Physician (Ophthalmology) Anabel Bene, Huston Foley, MD as Referring Physician (Hematology and Oncology) Eveline Keto, MD as Referring Physician (Urology) Wyn Quaker Marlow Baars, MD as Referring Physician (Vascular Surgery) Linus Salmons, MD (Otolaryngology) Dasher, Cliffton Asters, MD (Dermatology)  Review of Systems       Objective    Vitals: BP (!) 114/52 (BP Location: Left Arm, Patient Position: Sitting, Cuff Size: Normal)   Pulse (!) 59   Ht 5\' 5"  (1.651 m)   Wt 112 lb 8 oz (51 kg)   SpO2 96%   BMI 18.72 kg/m      Physical Exam Vitals reviewed.  Constitutional:      General: She is not in acute distress.    Appearance: Normal appearance. She is well-developed. She is not diaphoretic.  HENT:     Head: Normocephalic and atraumatic.     Right Ear: Tympanic membrane, ear canal and external ear normal.     Left Ear: Tympanic membrane, ear canal and external ear normal.     Nose: Nose normal.     Mouth/Throat:     Mouth: Mucous membranes are moist.     Pharynx: Oropharynx is clear. No oropharyngeal exudate.  Eyes:     General: No scleral icterus.    Conjunctiva/sclera: Conjunctivae normal.     Pupils: Pupils are equal, round, and reactive to light.  Neck:     Thyroid: No thyromegaly.  Cardiovascular:     Rate and Rhythm: Normal rate and regular rhythm.     Heart sounds: Normal heart sounds. No murmur heard. Pulmonary:     Effort: Pulmonary effort is normal. No respiratory distress.      Breath sounds: Normal breath sounds. No wheezing or rales.  Abdominal:     General: There is no distension.     Palpations: Abdomen is soft.     Tenderness: There is no abdominal tenderness.  Musculoskeletal:        General: No deformity.     Cervical back: Neck supple.     Right lower leg: No edema.     Left lower leg: No edema.  Lymphadenopathy:     Cervical: No cervical adenopathy.  Skin:    General: Skin is warm and dry.  Neurological:     Mental Status: She is alert and oriented to person, place, and time. Mental status is at baseline.     Gait: Gait normal.  Psychiatric:  Mood and Affect: Mood normal.        Behavior: Behavior normal.        Thought Content: Thought content normal.     Most recent functional status assessment:    09/11/2023    9:46 AM  In your present state of health, do you have any difficulty performing the following activities:  Hearing? 0  Vision? 0  Difficulty concentrating or making decisions? 0  Walking or climbing stairs? 0  Dressing or bathing? 0  Doing errands, shopping? 0  Preparing Food and eating ? Y  Using the Toilet? N  In the past six months, have you accidently leaked urine? N  Do you have problems with loss of bowel control? N  Managing your Medications? N  Managing your Finances? N  Housekeeping or managing your Housekeeping? N   Most recent fall risk assessment:    09/09/2022   10:24 AM  Fall Risk   Falls in the past year? 1  Number falls in past yr: 0  Injury with Fall? 0    Most recent depression screenings:    09/09/2022   10:24 AM 08/30/2021    9:31 AM  PHQ 2/9 Scores  PHQ - 2 Score 0 0  PHQ- 9 Score 0 2   Most recent cognitive screening:    09/11/2023   10:08 AM  6CIT Screen  What Year? 0 points  What month? 0 points  What time? 0 points  Count back from 20 0 points  Months in reverse 0 points  Repeat phrase 0 points  Total Score 0 points   Most recent Audit-C alcohol use screening    09/09/2022    10:24 AM  Alcohol Use Disorder Test (AUDIT)  1. How often do you have a drink containing alcohol? 0  2. How many drinks containing alcohol do you have on a typical day when you are drinking? 0  3. How often do you have six or more drinks on one occasion? 0  AUDIT-C Score 0   A score of 3 or more in women, and 4 or more in men indicates increased risk for alcohol abuse, EXCEPT if all of the points are from question 1   No results found.  No results found for any visits on 09/11/23.  Assessment & Plan     Annual wellness visit done today including the all of the following: Reviewed patient's Family Medical History Reviewed and updated list of patient's medical providers Assessment of cognitive impairment was done Assessed patient's functional ability Established a written schedule for health screening services Health Risk Assessent Completed and Reviewed  Exercise Activities and Dietary recommendations  Goals      Increase physical activity        Immunization History  Administered Date(s) Administered   Fluad Quad(high Dose 65+) 04/13/2021, 05/09/2022   Influenza Split 07/21/2012   Influenza, High Dose Seasonal PF 04/14/2014, 05/11/2015, 05/04/2016, 05/08/2017, 04/25/2018   Influenza,inj,Quad PF,6+ Mos 04/15/2013   Influenza-Unspecified 04/19/2020, 05/16/2021, 04/25/2023   PFIZER(Purple Top)SARS-COV-2 Vaccination 12/14/2019, 01/06/2020, 07/14/2020   PNEUMOCOCCAL CONJUGATE-20 11/08/2021   Pfizer Covid-19 Vaccine Bivalent Booster 59yrs & up 05/18/2021   Pfizer(Comirnaty)Fall Seasonal Vaccine 12 years and older 08/15/2022   Pneumococcal Conjugate-13 12/29/2013   Pneumococcal Polysaccharide-23 12/28/2012   Rsv, Bivalent, Protein Subunit Rsvpref,pf Verdis Frederickson) 06/18/2022   Tdap 11/27/2011   Zoster Recombinant(Shingrix) 03/05/2018, 06/11/2018   Zoster, Live 01/23/2011    Health Maintenance  Topic Date Due   DTaP/Tdap/Td (2 - Td or Tdap) 11/26/2021  COVID-19 Vaccine (6 -  2024-25 season) 03/09/2023   DEXA SCAN  10/25/2023   Medicare Annual Wellness (AWV)  09/10/2024   Colonoscopy  09/25/2028   Pneumonia Vaccine 38+ Years old  Completed   INFLUENZA VACCINE  Completed   Hepatitis C Screening  Completed   Zoster Vaccines- Shingrix  Completed   HPV VACCINES  Aged Out     Discussed health benefits of physical activity, and encouraged her to engage in regular exercise appropriate for her age and condition.    Problem List Items Addressed This Visit       Digestive   Chronic diarrhea   Chronic diarrhea, primarily in the mornings, with initial normal stool followed by watery stool. Symptoms have persisted for over six months. Previous tests ruled out bacterial infections. 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 Discussed colestipol to bind bile acids and reduce diarrhea, with minimal side effects. If ineffective, consider switching sertraline, though not ideal during current high-stress period. GI referral for possible endoscopy if symptoms persist. - Prescribe colestipol, 1 pill twice daily - Reassess in one month for effectiveness - Consider switching sertraline if colestipol is ineffective - Refer to GI for possible scope if symptoms persist        Other   Hypercholesteremia   Relevant Medications   colestipol (COLESTID) 1 g tablet   Adjustment disorder with anxiety   Significant anxiety exacerbated by medical procedures and health concerns. Currently managed with sertraline. Klonopin previously used as needed for acute anxiety episodes. Consider adding or switching to Remeron if weight gain is needed. - Prescribe Klonopin, 15 pills for as-needed use - Reassess anxiety management in six weeks - Consider adding or switching to Remeron if weight gain is needed      FTT (failure to thrive) in adult   Unintentional weight loss of approximately 9 pounds since October. Contributing factors include chronic  diarrhea, anxiety, and decreased appetite during periods of high stress. Discussed adding nutritional supplements and high-calorie, low-volume foods. Consider Remeron for weight gain if needed in the future. - Recommend nutritional supplements once or twice daily - Encourage high-calorie, low-volume foods (e.g., nut butters) - Consider Remeron for weight gain if needed in the future      Unintentional weight loss   Other Visit Diagnoses       Encounter for annual wellness visit (AWV) in Medicare patient    -  Primary     Encounter for annual physical exam               General Health Maintenance Up to date on most health maintenance items. Recent tetanus shot received but not yet documented in the medical record. - Obtain record of recent tetanus shot - Continue annual mammograms - Continue annual flu shots - Monitor cholesterol levels at next lab appointment  Follow-up - Schedule follow-up appointment in six weeks.       Return in about 6 weeks (around 10/23/2023) for weight and diarrhea f/u.     Shirlee Latch, MD  [facility] [phone] (phone) [phone] (fax)  [facility]

## 2023-09-11 NOTE — Assessment & Plan Note (Signed)
 Significant anxiety exacerbated by medical procedures and health concerns. Currently managed with sertraline. Klonopin previously used as needed for acute anxiety episodes. Consider adding or switching to Remeron if weight gain is needed. - Prescribe Klonopin, 15 pills for as-needed use - Reassess anxiety management in six weeks - Consider adding or switching to Remeron if weight gain is needed

## 2023-09-15 DIAGNOSIS — C7802 Secondary malignant neoplasm of left lung: Secondary | ICD-10-CM | POA: Diagnosis not present

## 2023-09-15 DIAGNOSIS — C641 Malignant neoplasm of right kidney, except renal pelvis: Secondary | ICD-10-CM | POA: Diagnosis not present

## 2023-09-15 DIAGNOSIS — C3491 Malignant neoplasm of unspecified part of right bronchus or lung: Secondary | ICD-10-CM | POA: Diagnosis not present

## 2023-09-22 DIAGNOSIS — C3491 Malignant neoplasm of unspecified part of right bronchus or lung: Secondary | ICD-10-CM | POA: Diagnosis not present

## 2023-09-22 DIAGNOSIS — C7802 Secondary malignant neoplasm of left lung: Secondary | ICD-10-CM | POA: Diagnosis not present

## 2023-09-24 ENCOUNTER — Ambulatory Visit: Payer: Self-pay

## 2023-09-24 DIAGNOSIS — K529 Noninfective gastroenteritis and colitis, unspecified: Secondary | ICD-10-CM

## 2023-09-24 NOTE — Telephone Encounter (Signed)
 Recommend additional stool studies to look for non-infectious colon inflammation. Have placed order. In the meantime can take OTC imodium once or twice a day.   Please print order for stool calprotectin, lactoferrin, fecal leukocytes, pancreatic elastase, and c. Diff toxins.

## 2023-09-24 NOTE — Telephone Encounter (Signed)
 Pt advised verbalized understanding and will come by the office tomorrow

## 2023-09-24 NOTE — Telephone Encounter (Signed)
 Summary: Diarrhea   Copied From CRM [id number]. Reason for Triage: Patient states she was prescribed colestipol (COLESTID) 1 g tablet on 3/6/205 for diarrhea. Patient states she d/c medication because the diarrhea did not stop and medication made her feel bad.       Chief Complaint: diarrhea Symptoms: loose, watery stools Frequency: ongoing since 09/11/2023 Pertinent Negatives: Patient denies blood stools Disposition: [] ED /[] Urgent Care (no appt availability in office) / [] Appointment(In office/virtual)/ []  [facility]/ [] Home Care/ [] Refused Recommended Disposition /[] [facility]/ [x]  Follow-up with PCP Additional Notes: pt had PCP appt on 09/11/2023 and prescribed Rx to help w/diarrhea: pt stated took last dose of medication on 09/22/2023 because ever since she stated taking medication she has been feeling bad, no energy -like sick/sluggish : once stopped medication she feels better . Pt stated while on medication it did not stop nor slow diarrhea down - she was suppose to be on medication for approx 6 months .  Pt would like to try a different prescription to help or be advised of what she can take OTC to help with diarrhea if possible.     Reason for Disposition  SEVERE diarrhea (e.g., 7 or more times / day more than normal)    Will route information to PCP in order to contact pt with further instructions  Answer Assessment - Initial Assessment Questions 1. DIARRHEA SEVERITY: "How bad is the diarrhea?" "How many more stools have you had in the past 24 hours than normal?"    - NO DIARRHEA (SCALE 0)   - MILD (SCALE 1-3): Few loose or mushy BMs; increase of 1-3 stools over normal daily number of stools; mild increase in ostomy output.   -  MODERATE (SCALE 4-7): Increase of 4-6 stools daily over normal; moderate increase in ostomy output.   -  SEVERE (SCALE 8-10; OR "WORST POSSIBLE"): Increase of 7 or more stools daily over normal; moderate increase in ostomy output;  incontinence.     Moderate to severe 2. ONSET: "When did the diarrhea begin?"      09/11/2023 3. BM CONSISTENCY: "How loose or watery is the diarrhea?"      Loose, watery   4. VOMITING: "Are you also vomiting?" If Yes, ask: "How many times in the past 24 hours?"      no 5. ABDOMEN PAIN: "Are you having any abdomen pain?" If Yes, ask: "What does it feel like?" (e.g., crampy, dull, intermittent, constant)      no 6. ABDOMEN PAIN SEVERITY: If present, ask: "How bad is the pain?"  (e.g., Scale 1-10; mild, moderate, or severe)   - MILD (1-3): doesn't interfere with normal activities, abdomen soft and not tender to touch    - MODERATE (4-7): interferes with normal activities or awakens from sleep, abdomen tender to touch    - SEVERE (8-10): excruciating pain, doubled over, unable to do any normal activities       non 7. ORAL INTAKE: If vomiting, "Have you been able to drink liquids?" "How much liquids have you had in the past 24 hours?"     Drinking fluids 8. HYDRATION: "Any signs of dehydration?" (e.g., dry mouth [not just dry lips], too weak to stand, dizziness, new weight loss) "When did you last urinate?"     Urinating well 9. EXPOSURE: "Have you traveled to a foreign country recently?" "Have you been exposed to anyone with diarrhea?" "Could you have eaten any food that was spoiled?"     N/a 10.  ANTIBIOTIC USE: "Are you taking antibiotics now or have you taken antibiotics in the past 2 months?"       no 11. OTHER SYMPTOMS: "Do you have any other symptoms?" (e.g., fever, blood in stool)       none 12. PREGNANCY: "Is there any chance you are pregnant?" "When was your last menstrual period?"       N/a  Protocols used: [facility]

## 2023-09-25 ENCOUNTER — Other Ambulatory Visit: Payer: Self-pay | Admitting: Family Medicine

## 2023-09-25 DIAGNOSIS — K529 Noninfective gastroenteritis and colitis, unspecified: Secondary | ICD-10-CM | POA: Diagnosis not present

## 2023-09-30 DIAGNOSIS — C7802 Secondary malignant neoplasm of left lung: Secondary | ICD-10-CM | POA: Diagnosis not present

## 2023-10-01 DIAGNOSIS — C7802 Secondary malignant neoplasm of left lung: Secondary | ICD-10-CM | POA: Diagnosis not present

## 2023-10-03 DIAGNOSIS — C7802 Secondary malignant neoplasm of left lung: Secondary | ICD-10-CM | POA: Diagnosis not present

## 2023-10-07 DIAGNOSIS — C7802 Secondary malignant neoplasm of left lung: Secondary | ICD-10-CM | POA: Diagnosis not present

## 2023-10-08 LAB — FECAL LACTOFERRIN, QUANT: Lactoferrin, Fecal, Quant.: <1 ug/mL (ref 0.00–7.24)

## 2023-10-08 LAB — CLOSTRIDIUM DIFFICILE BY PCR: Toxigenic C. Difficile by PCR: NEGATIVE

## 2023-10-08 LAB — FECAL LEUKOCYTES

## 2023-10-08 LAB — CALPROTECTIN, FECAL: Calprotectin, Fecal: 20 ug/g (ref 0–120)

## 2023-10-10 NOTE — Progress Notes (Signed)
 It was ordered, and it was really the most important test that we needed. Can they add it?. If not then it needs to be reordered and they need to contact the patient and tell them they didn't do that test that was ordered.

## 2023-10-14 ENCOUNTER — Telehealth: Payer: Self-pay

## 2023-10-14 NOTE — Telephone Encounter (Signed)
 Copied from CRM [id number]. Topic: Clinical - Lab/Test Results >> Oct 14, 2023  2:51 PM Turkey B wrote: Reason for CRM: pt missed call about lab results, but note dont show resulting notes. Please cb when available

## 2023-10-14 NOTE — Telephone Encounter (Signed)
 Spoke with patient and she is going to come by to pick up the stool container to have the Pancreatic Elastase test done.

## 2023-10-15 ENCOUNTER — Other Ambulatory Visit: Payer: Self-pay | Admitting: Family Medicine

## 2023-10-15 DIAGNOSIS — E7800 Pure hypercholesterolemia, unspecified: Secondary | ICD-10-CM

## 2023-10-15 NOTE — Progress Notes (Signed)
 Spoke with patient and is going to come by the lab.

## 2023-10-20 LAB — PANCREATIC ELASTASE, FECAL: Pancreatic Elastase, Fecal: 730 ug Elast./g (ref >200)

## 2023-10-31 ENCOUNTER — Ambulatory Visit: Admitting: Family Medicine

## 2023-10-31 ENCOUNTER — Encounter: Payer: Self-pay | Admitting: Family Medicine

## 2023-10-31 VITALS — BP 125/56 | HR 63 | Ht 65.0 in | Wt 114.0 lb

## 2023-10-31 DIAGNOSIS — R627 Adult failure to thrive: Secondary | ICD-10-CM | POA: Diagnosis not present

## 2023-10-31 DIAGNOSIS — K580 Irritable bowel syndrome with diarrhea: Secondary | ICD-10-CM

## 2023-10-31 NOTE — Progress Notes (Signed)
 Acute visit   Patient: Rita Webb   DOB: September 28, 1945   78 y.o. Female  MRN: [id number] PCP: Mazie Speed, MD   Chief Complaint  Patient presents with   Diarrhea    She is still experiencing diarrhea   Subjective    Discussed the use of AI scribe software for clinical note transcription with the patient, who gave verbal consent to proceed.  History of Present Illness   The patient, with a history of stress-related gastrointestinal issues, presents with ongoing diarrhea. The patient reports that she stopped taking an unspecified medication on the eighteenth of March due to its size and lack of efficacy. The patient also mentions recent radiation treatment, which caused significant stress and illness. Despite stopping the medication, the patient continues to experience diarrhea. The patient describes the bowel movements as initially abnormal, followed by watery stools. The patient has been managing the diarrhea with over-the-counter Imodium, which has been effective. The patient also reports taking probiotics. The patient has a history of similar gastrointestinal issues during periods of stress, such as when a family member was diagnosed with cancer.        Review of Systems   Objective    BP (!) 125/56   Pulse 63   Ht 5\' 5"  (1.651 m)   Wt 114 lb (51.7 kg)   SpO2 100%   BMI 18.97 kg/m  Physical Exam Vitals reviewed.  Constitutional:      General: She is not in acute distress.    Appearance: Normal appearance. She is well-developed. She is not diaphoretic.  HENT:     Head: Normocephalic and atraumatic.  Eyes:     General: No scleral icterus.    Conjunctiva/sclera: Conjunctivae normal.  Neck:     Thyroid: No thyromegaly.  Cardiovascular:     Rate and Rhythm: Normal rate and regular rhythm.     Heart sounds: Normal heart sounds. No murmur heard. Pulmonary:     Effort: Pulmonary effort is normal. No respiratory distress.     Breath sounds: Normal  breath sounds. No wheezing, rhonchi or rales.  Abdominal:     General: Bowel sounds are normal. There is no distension.     Palpations: Abdomen is soft.     Tenderness: There is no abdominal tenderness.  Musculoskeletal:     Cervical back: Neck supple.     Right lower leg: No edema.     Left lower leg: No edema.  Lymphadenopathy:     Cervical: No cervical adenopathy.  Skin:    General: Skin is warm and dry.     Findings: No rash.  Neurological:     Mental Status: She is alert and oriented to person, place, and time. Mental status is at baseline.  Psychiatric:        Mood and Affect: Mood normal.        Behavior: Behavior normal.       No results found for any visits on 10/31/23.  Assessment & Plan     Problem List Items Addressed This Visit       Other   FTT (failure to thrive) in adult   Other Visit Diagnoses       Irritable bowel syndrome with diarrhea    -  Primary           Irritable Bowel Syndrome (IBS) Chronic diarrhea with episodes of watery stools, likely exacerbated by stress. Extensive workup including CT abd yearly and stool samples, imaging, and  tests for pancreatic issues, white blood cells, and C. diff were negative. Previous treatments, including Colestipol , were ineffective. Symptoms align with IBS, a diagnosis of exclusion, particularly given the stress-related onset. Imodium has been effective in managing symptoms. - Recommend daily Imodium to manage diarrhea symptoms. Start with one tablet daily and increase as needed, up to a maximum of eight tablets per day, to find the effective dose. - Continue taking probiotics containing lactobacillus. - Monitor for new symptoms such as blood in stool or weight loss, and consider referral to GI if these occur.  Cancer Recent radiation treatment completed with three sessions.   General Health Maintenance Weight has increased by two pounds since the last visit. No blood work was done during the recent  wellness visit due to recent tests. - Schedule a follow-up in three months to monitor weight and overall health.         No orders of the defined types were placed in this encounter.    Return in about 3 months (around 01/30/2024) for chronic disease f/u.      Aden Agreste, MD  [facility] [phone] (phone) [phone] (fax)  [facility]

## 2023-11-24 DIAGNOSIS — C7802 Secondary malignant neoplasm of left lung: Secondary | ICD-10-CM | POA: Diagnosis not present

## 2023-12-08 DIAGNOSIS — H401213 Low-tension glaucoma, right eye, severe stage: Secondary | ICD-10-CM | POA: Diagnosis not present

## 2023-12-08 DIAGNOSIS — Z9842 Cataract extraction status, left eye: Secondary | ICD-10-CM | POA: Diagnosis not present

## 2023-12-08 DIAGNOSIS — H401221 Low-tension glaucoma, left eye, mild stage: Secondary | ICD-10-CM | POA: Diagnosis not present

## 2023-12-08 DIAGNOSIS — H52213 Irregular astigmatism, bilateral: Secondary | ICD-10-CM | POA: Diagnosis not present

## 2023-12-08 DIAGNOSIS — Z9841 Cataract extraction status, right eye: Secondary | ICD-10-CM | POA: Diagnosis not present

## 2024-01-15 ENCOUNTER — Other Ambulatory Visit: Payer: Self-pay | Admitting: Family Medicine

## 2024-02-03 ENCOUNTER — Ambulatory Visit: Admitting: Family Medicine

## 2024-02-03 ENCOUNTER — Encounter: Payer: Self-pay | Admitting: Family Medicine

## 2024-02-03 VITALS — BP 116/52 | HR 58 | Temp 97.3°F | Ht 65.0 in | Wt 117.0 lb

## 2024-02-03 DIAGNOSIS — K529 Noninfective gastroenteritis and colitis, unspecified: Secondary | ICD-10-CM | POA: Diagnosis not present

## 2024-02-03 DIAGNOSIS — R7303 Prediabetes: Secondary | ICD-10-CM

## 2024-02-03 DIAGNOSIS — R634 Abnormal weight loss: Secondary | ICD-10-CM

## 2024-02-03 DIAGNOSIS — E559 Vitamin D deficiency, unspecified: Secondary | ICD-10-CM

## 2024-02-03 DIAGNOSIS — E7800 Pure hypercholesterolemia, unspecified: Secondary | ICD-10-CM | POA: Diagnosis not present

## 2024-02-03 DIAGNOSIS — C3491 Malignant neoplasm of unspecified part of right bronchus or lung: Secondary | ICD-10-CM

## 2024-02-03 DIAGNOSIS — R627 Adult failure to thrive: Secondary | ICD-10-CM

## 2024-02-03 DIAGNOSIS — F4322 Adjustment disorder with anxiety: Secondary | ICD-10-CM

## 2024-02-03 NOTE — Assessment & Plan Note (Signed)
 Currently managed by oncology. Reports three radiation treatments with good scans and results. - Continue follow up with oncology as scheduled

## 2024-02-03 NOTE — Assessment & Plan Note (Signed)
 Currently well controlled on medication regimen. Has been taking both imodium and a probiotic to help with her diarrhea post-cholecystectomy. Endorses great improvement with 4-5 lb weight gain since last visit. Denies any other symptoms or adverse medication side effects. - Continue probiotic and imodium every other day - Order CMP to assess for electrolytes

## 2024-02-03 NOTE — Assessment & Plan Note (Signed)
 Currently well controlled on lifestyle changes. Denies any symptoms. Last A1C was stable. - Discussed lifestyle modifications - Order A1C

## 2024-02-03 NOTE — Assessment & Plan Note (Signed)
 Currently well managed with improvement of chronic conditions. Previously had a 9 pound weight loss due to diarrhea and anxiety impacting her appetite. Previously discussed interventions aimed at improving oral intake as well as managing diarrhea and anxiety. Has gained 4-5 lbs since her last visit in March with improved appetite and better control of her diarrhea and anxiety - Continue current medication regimen to help manage diarrhea and anxiety - Recommend nutritional supplements and high calorie, low volume foods

## 2024-02-03 NOTE — Progress Notes (Unsigned)
 Established Patient Office Visit  Subjective   Patient ID: Rita Webb, female    DOB: March 23, 1946  Age: 78 y.o. MRN: [id number]  Chief Complaint  Patient presents with   Medical Management of Chronic Issues    Rita Webb is a 78 year old female with a history of lung cancer, IBS, FTT, prediabetes, hypercholesterolemia, and avitaminosis D who presents to the clinic today for follow up management of her chronic conditions.  On interview, she reports doing well. She reports better control of her IBS with imodium and probiotics, helping her regain some of her previously lost weight. She also reports good control of her prediabetes and high cholesterol with lifestyle changes and medication. She states that her lung cancer is well managed with oncology. She describes reduction in her overall anxiety levels since she noticed improvement in her chronic conditions, particularly the lung cancer and IBS.  She reports no other concerns today.    {History (Optional):23778}  ROS    Objective:     BP (!) 116/52 (BP Location: Left Arm, Patient Position: Sitting, Cuff Size: Normal)   Pulse (!) 58   Temp (!) 97.3 F (36.3 C) (Oral)   Ht 5' 5 (1.651 m)   Wt 117 lb (53.1 kg)   SpO2 99%   BMI 19.47 kg/m  {Vitals History (Optional):23777}  Physical Exam Vitals reviewed.  Constitutional:      General: She is not in acute distress.    Appearance: Normal appearance. She is not ill-appearing or diaphoretic.  HENT:     Head: Normocephalic.     Right Ear: Tympanic membrane, ear canal and external ear normal.     Left Ear: Tympanic membrane, ear canal and external ear normal.     Nose: Nose normal.     Mouth/Throat:     Mouth: Mucous membranes are moist.     Pharynx: Oropharynx is clear. No oropharyngeal exudate or posterior oropharyngeal erythema.  Eyes:     General: No scleral icterus.    Conjunctiva/sclera: Conjunctivae normal.     Pupils: Pupils are equal, round, and  reactive to light.  Cardiovascular:     Rate and Rhythm: Normal rate and regular rhythm.     Pulses: Normal pulses.     Heart sounds: Normal heart sounds. No murmur heard.    No friction rub. No gallop.  Pulmonary:     Effort: Pulmonary effort is normal. No respiratory distress.     Breath sounds: Normal breath sounds. No wheezing.  Abdominal:     General: There is no distension.     Palpations: Abdomen is soft.     Tenderness: There is no abdominal tenderness. There is no guarding.  Musculoskeletal:     Cervical back: Normal range of motion.     Right lower leg: No edema.     Left lower leg: No edema.  Lymphadenopathy:     Cervical: No cervical adenopathy.  Skin:    General: Skin is warm and dry.     Capillary Refill: Capillary refill takes less than 2 seconds.  Neurological:     Mental Status: She is alert and oriented to person, place, and time.  Psychiatric:        Mood and Affect: Mood normal.      No results found for any visits on 02/03/24.  {Labs (Optional):23779}  The 10-year ASCVD risk score (Arnett DK, et al., 2019) is: 14.8%    Assessment & Plan:   Problem List Items Addressed This  Visit       Respiratory   Non-small cell lung cancer, right [facility])   Currently managed by oncology. Reports three radiation treatments with good scans and results. - Continue follow up with oncology as scheduled        Digestive   Chronic diarrhea - Primary   Currently well controlled on medication regimen. Has been taking both imodium and a probiotic to help with her diarrhea post-cholecystectomy. Endorses great improvement with 4-5 lb weight gain since last visit. Denies any other symptoms or adverse medication side effects. - Continue probiotic and imodium every other day - Order CMP to assess for electrolytes       Relevant Orders   Comprehensive Metabolic Panel (CMET)     Other   Hypercholesteremia   Currently well controlled on medication regimen. Denies any  symptoms or adverse medication side effects. Last lipid panel was stable. - Continue simvastatin  10 mg daily - Order FLP and CMP       Relevant Orders   Comprehensive Metabolic Panel (CMET)   Lipid Profile   Avitaminosis D   Currently well controlled on medication regimen. Denies any symptoms or adverse medication side effects. Last vit D was stable. - Continue vit D supplementation - Order Vit D levels       Relevant Orders   Vitamin D  (25 hydroxy)   Adjustment disorder with anxiety   Currently managed and correlated with improvement in chronic conditions. States that her overall anxiety is lower, especially as her diarrhea and cancer treatments have progressed. Describes exacerbations in her anxiety only when going to the doctor's office.  - Continue current management regimen - Follow up and reassess anxiety levels at follow up or earlier if needed      Prediabetes   Currently well controlled on lifestyle changes. Denies any symptoms. Last A1C was stable. - Discussed lifestyle modifications - Order A1C       Relevant Orders   HgB A1c   FTT (failure to thrive) in adult   Currently well managed with improvement of chronic conditions. Previously had a 9 pound weight loss due to diarrhea and anxiety impacting her appetite. Previously discussed interventions aimed at improving oral intake as well as managing diarrhea and anxiety. Has gained 4-5 lbs since her last visit in March with improved appetite and better control of her diarrhea and anxiety - Continue current medication regimen to help manage diarrhea and anxiety - Recommend nutritional supplements and high calorie, low volume foods      Unintentional weight loss   General health Maintenance - Will revisit at next follow up  Return in about 6 months (around 08/05/2024) for chronic disease f/u.    Rita Webb, Medical Student

## 2024-02-03 NOTE — Assessment & Plan Note (Signed)
 Currently managed and correlated with improvement in chronic conditions. States that her overall anxiety is lower, especially as her diarrhea and cancer treatments have progressed. Describes exacerbations in her anxiety only when going to the doctor's office.  - Continue current management regimen - Follow up and reassess anxiety levels at follow up or earlier if needed

## 2024-02-03 NOTE — Assessment & Plan Note (Signed)
 Currently well controlled on medication regimen. Denies any symptoms or adverse medication side effects. Last lipid panel was stable. - Continue simvastatin  10 mg daily - Order FLP and CMP

## 2024-02-03 NOTE — Assessment & Plan Note (Signed)
 Currently well controlled on medication regimen. Denies any symptoms or adverse medication side effects. Last vit D was stable. - Continue vit D supplementation - Order Vit D levels

## 2024-02-04 ENCOUNTER — Ambulatory Visit: Payer: Self-pay | Admitting: Family Medicine

## 2024-02-04 LAB — COMPREHENSIVE METABOLIC PANEL WITH GFR
ALT: 19 IU/L (ref 0–32)
AST: 27 IU/L (ref 0–40)
Albumin: 4.4 g/dL (ref 3.8–4.8)
Alkaline Phosphatase: 83 IU/L (ref 44–121)
BUN/Creatinine Ratio: 27 (ref 12–28)
BUN: 21 mg/dL (ref 8–27)
Bilirubin Total: 0.3 mg/dL (ref 0.0–1.2)
CO2: 22 mmol/L (ref 20–29)
Calcium: 9.2 mg/dL (ref 8.7–10.3)
Chloride: 104 mmol/L (ref 96–106)
Creatinine, Ser: 0.78 mg/dL (ref 0.57–1.00)
Globulin, Total: 2.2 g/dL (ref 1.5–4.5)
Glucose: 89 mg/dL (ref 70–99)
Potassium: 4.6 mmol/L (ref 3.5–5.2)
Sodium: 139 mmol/L (ref 134–144)
Total Protein: 6.6 g/dL (ref 6.0–8.5)
eGFR: 79 mL/min/1.73 (ref >59)

## 2024-02-04 LAB — LIPID PANEL
Chol/HDL Ratio: 2.4 ratio (ref 0.0–4.4)
Cholesterol, Total: 139 mg/dL (ref 100–199)
HDL: 58 mg/dL (ref >39)
LDL Chol Calc (NIH): 59 mg/dL (ref 0–99)
Triglycerides: 124 mg/dL (ref 0–149)
VLDL Cholesterol Cal: 22 mg/dL (ref 5–40)

## 2024-02-04 LAB — HEMOGLOBIN A1C
Est. average glucose Bld gHb Est-mCnc: 117 mg/dL
Hgb A1c MFr Bld: 5.7 % — ABNORMAL HIGH (ref 4.8–5.6)

## 2024-02-04 LAB — VITAMIN D 25 HYDROXY (VIT D DEFICIENCY, FRACTURES): Vit D, 25-Hydroxy: 49.5 ng/mL (ref 30.0–100.0)

## 2024-02-05 DIAGNOSIS — H00012 Hordeolum externum right lower eyelid: Secondary | ICD-10-CM | POA: Diagnosis not present

## 2024-03-04 DIAGNOSIS — C3432 Malignant neoplasm of lower lobe, left bronchus or lung: Secondary | ICD-10-CM | POA: Diagnosis not present

## 2024-03-04 DIAGNOSIS — C3491 Malignant neoplasm of unspecified part of right bronchus or lung: Secondary | ICD-10-CM | POA: Diagnosis not present

## 2024-03-04 DIAGNOSIS — C641 Malignant neoplasm of right kidney, except renal pelvis: Secondary | ICD-10-CM | POA: Diagnosis not present

## 2024-03-04 DIAGNOSIS — R918 Other nonspecific abnormal finding of lung field: Secondary | ICD-10-CM | POA: Diagnosis not present

## 2024-03-04 DIAGNOSIS — Z87891 Personal history of nicotine dependence: Secondary | ICD-10-CM | POA: Diagnosis not present

## 2024-03-04 DIAGNOSIS — Z85118 Personal history of other malignant neoplasm of bronchus and lung: Secondary | ICD-10-CM | POA: Diagnosis not present

## 2024-03-17 DIAGNOSIS — D2261 Melanocytic nevi of right upper limb, including shoulder: Secondary | ICD-10-CM | POA: Diagnosis not present

## 2024-03-17 DIAGNOSIS — D2262 Melanocytic nevi of left upper limb, including shoulder: Secondary | ICD-10-CM | POA: Diagnosis not present

## 2024-03-17 DIAGNOSIS — D225 Melanocytic nevi of trunk: Secondary | ICD-10-CM | POA: Diagnosis not present

## 2024-03-17 DIAGNOSIS — D2272 Melanocytic nevi of left lower limb, including hip: Secondary | ICD-10-CM | POA: Diagnosis not present

## 2024-03-17 DIAGNOSIS — C44622 Squamous cell carcinoma of skin of right upper limb, including shoulder: Secondary | ICD-10-CM | POA: Diagnosis not present

## 2024-03-17 DIAGNOSIS — D485 Neoplasm of uncertain behavior of skin: Secondary | ICD-10-CM | POA: Diagnosis not present

## 2024-03-17 DIAGNOSIS — R208 Other disturbances of skin sensation: Secondary | ICD-10-CM | POA: Diagnosis not present

## 2024-04-05 DIAGNOSIS — M79605 Pain in left leg: Secondary | ICD-10-CM | POA: Diagnosis not present

## 2024-04-05 DIAGNOSIS — J439 Emphysema, unspecified: Secondary | ICD-10-CM | POA: Diagnosis not present

## 2024-04-05 DIAGNOSIS — C641 Malignant neoplasm of right kidney, except renal pelvis: Secondary | ICD-10-CM | POA: Diagnosis not present

## 2024-04-05 DIAGNOSIS — M7989 Other specified soft tissue disorders: Secondary | ICD-10-CM | POA: Diagnosis not present

## 2024-04-05 DIAGNOSIS — Z85828 Personal history of other malignant neoplasm of skin: Secondary | ICD-10-CM | POA: Diagnosis not present

## 2024-04-05 DIAGNOSIS — R197 Diarrhea, unspecified: Secondary | ICD-10-CM | POA: Diagnosis not present

## 2024-04-05 DIAGNOSIS — Z905 Acquired absence of kidney: Secondary | ICD-10-CM | POA: Diagnosis not present

## 2024-04-05 DIAGNOSIS — Z79899 Other long term (current) drug therapy: Secondary | ICD-10-CM | POA: Diagnosis not present

## 2024-04-05 DIAGNOSIS — Z85118 Personal history of other malignant neoplasm of bronchus and lung: Secondary | ICD-10-CM | POA: Diagnosis not present

## 2024-04-05 DIAGNOSIS — C7802 Secondary malignant neoplasm of left lung: Secondary | ICD-10-CM | POA: Diagnosis not present

## 2024-04-05 DIAGNOSIS — C649 Malignant neoplasm of unspecified kidney, except renal pelvis: Secondary | ICD-10-CM | POA: Diagnosis not present

## 2024-04-05 DIAGNOSIS — Z87891 Personal history of nicotine dependence: Secondary | ICD-10-CM | POA: Diagnosis not present

## 2024-04-05 DIAGNOSIS — Z803 Family history of malignant neoplasm of breast: Secondary | ICD-10-CM | POA: Diagnosis not present

## 2024-04-05 DIAGNOSIS — Z86718 Personal history of other venous thrombosis and embolism: Secondary | ICD-10-CM | POA: Diagnosis not present

## 2024-04-05 DIAGNOSIS — C7800 Secondary malignant neoplasm of unspecified lung: Secondary | ICD-10-CM | POA: Diagnosis not present

## 2024-04-05 DIAGNOSIS — Z808 Family history of malignant neoplasm of other organs or systems: Secondary | ICD-10-CM | POA: Diagnosis not present

## 2024-04-05 DIAGNOSIS — Z5112 Encounter for antineoplastic immunotherapy: Secondary | ICD-10-CM | POA: Diagnosis not present

## 2024-04-09 DIAGNOSIS — C7800 Secondary malignant neoplasm of unspecified lung: Secondary | ICD-10-CM | POA: Diagnosis not present

## 2024-04-09 DIAGNOSIS — C649 Malignant neoplasm of unspecified kidney, except renal pelvis: Secondary | ICD-10-CM | POA: Diagnosis not present

## 2024-04-09 DIAGNOSIS — Z86718 Personal history of other venous thrombosis and embolism: Secondary | ICD-10-CM | POA: Diagnosis not present

## 2024-04-09 DIAGNOSIS — Z7901 Long term (current) use of anticoagulants: Secondary | ICD-10-CM | POA: Diagnosis not present

## 2024-04-12 ENCOUNTER — Other Ambulatory Visit: Payer: Self-pay | Admitting: Family Medicine

## 2024-04-12 DIAGNOSIS — E7800 Pure hypercholesterolemia, unspecified: Secondary | ICD-10-CM

## 2024-04-21 DIAGNOSIS — C44622 Squamous cell carcinoma of skin of right upper limb, including shoulder: Secondary | ICD-10-CM | POA: Diagnosis not present

## 2024-04-22 ENCOUNTER — Other Ambulatory Visit: Payer: Self-pay | Admitting: Family Medicine

## 2024-04-22 DIAGNOSIS — Z1231 Encounter for screening mammogram for malignant neoplasm of breast: Secondary | ICD-10-CM

## 2024-04-22 DIAGNOSIS — H401221 Low-tension glaucoma, left eye, mild stage: Secondary | ICD-10-CM | POA: Diagnosis not present

## 2024-04-22 DIAGNOSIS — H401213 Low-tension glaucoma, right eye, severe stage: Secondary | ICD-10-CM | POA: Diagnosis not present

## 2024-05-13 DIAGNOSIS — Z5112 Encounter for antineoplastic immunotherapy: Secondary | ICD-10-CM | POA: Diagnosis not present

## 2024-05-13 DIAGNOSIS — R197 Diarrhea, unspecified: Secondary | ICD-10-CM | POA: Diagnosis not present

## 2024-05-13 DIAGNOSIS — Z79899 Other long term (current) drug therapy: Secondary | ICD-10-CM | POA: Diagnosis not present

## 2024-05-13 DIAGNOSIS — C649 Malignant neoplasm of unspecified kidney, except renal pelvis: Secondary | ICD-10-CM | POA: Diagnosis not present

## 2024-05-13 DIAGNOSIS — C641 Malignant neoplasm of right kidney, except renal pelvis: Secondary | ICD-10-CM | POA: Diagnosis not present

## 2024-05-13 DIAGNOSIS — C7800 Secondary malignant neoplasm of unspecified lung: Secondary | ICD-10-CM | POA: Diagnosis not present

## 2024-05-13 DIAGNOSIS — R21 Rash and other nonspecific skin eruption: Secondary | ICD-10-CM | POA: Diagnosis not present

## 2024-05-19 ENCOUNTER — Ambulatory Visit
Admission: RE | Admit: 2024-05-19 | Discharge: 2024-05-19 | Disposition: A | Discharge status: Home / Self Care | Source: Ambulatory Visit | Attending: Family Medicine | Admitting: Family Medicine

## 2024-05-19 DIAGNOSIS — Z1231 Encounter for screening mammogram for malignant neoplasm of breast: Secondary | ICD-10-CM

## 2024-05-24 ENCOUNTER — Ambulatory Visit: Payer: Self-pay | Admitting: Family Medicine

## 2024-06-24 DIAGNOSIS — C7800 Secondary malignant neoplasm of unspecified lung: Secondary | ICD-10-CM | POA: Diagnosis not present

## 2024-06-24 DIAGNOSIS — C649 Malignant neoplasm of unspecified kidney, except renal pelvis: Secondary | ICD-10-CM | POA: Diagnosis not present

## 2024-06-24 DIAGNOSIS — C641 Malignant neoplasm of right kidney, except renal pelvis: Secondary | ICD-10-CM | POA: Diagnosis not present

## 2024-06-24 DIAGNOSIS — Z79899 Other long term (current) drug therapy: Secondary | ICD-10-CM | POA: Diagnosis not present

## 2024-07-13 ENCOUNTER — Other Ambulatory Visit: Payer: Self-pay | Admitting: Family Medicine

## 2024-07-15 ENCOUNTER — Ambulatory Visit: Payer: Self-pay

## 2024-07-15 DIAGNOSIS — R0981 Nasal congestion: Secondary | ICD-10-CM

## 2024-07-15 NOTE — Telephone Encounter (Signed)
 FYI Only or Action Required?: FYI only for provider: appointment scheduled on 07/16/24.  Patient was last seen in primary care on 02/03/2024 by Myrla Jon HERO, MD.  Called Nurse Triage reporting Cough.  Symptoms began a week ago.  Interventions attempted: OTC medications: cough drops and Rest, hydration, or home remedies.  Symptoms are: unchanged.  Triage Disposition: See PCP When Office is Open (Within 3 Days)  Patient/caregiver understands and will follow disposition?: Yes    Copied from CRM #[id number]. Topic: Clinical - Red Word Triage >> Jul 15, 2024  9:47 AM Antwanette L wrote: Red Word that prompted transfer to Nurse Triage: The patient is experiencing loss of appetite, fatigue, coughing up clear mucus, a runny nose, and yellow discharge building up in her eyes and the patient vision is not clear even when wearing glasses    Reason for Disposition  [1] Nasal discharge AND [2] present > 10 days  Answer Assessment - Initial Assessment Questions Pt called in stating that she has been experiencing fatigue, cough with clear mucous, runny nose and waking up with more eye crusting when she sleeps. Pt denies fever, no SOB, no chest pain. Pt states she has tried home care with cough drops and warm compress to eyes. Pt states she contacted infusion center and they reminded pt f/u with PCP. Discussed appt availability and pt willing to see alternative provider. Appointment scheduled for evaluation. Patient agrees with plan of care, and will call back if anything changes, or if symptoms worsen.      1. ONSET: When did the cough begin?      1 week ago   2. SEVERITY: How bad is the cough today?      Mild   3. SPUTUM: Describe the color of your sputum (e.g., none, dry cough; clear, white, yellow, green)     Clear   4. HEMOPTYSIS: Are you coughing up any blood? If Yes, ask: How much? (e.g., flecks, streaks, tablespoons, etc.)     No   5. DIFFICULTY BREATHING: Are you  having difficulty breathing? If Yes, ask: How bad is it? (e.g., mild, moderate, severe)      No   6. FEVER: Do you have a fever? If Yes, ask: What is your temperature, how was it measured, and when did it start?     No  Protocols used: Cough - Acute Productive-A-AH

## 2024-07-16 ENCOUNTER — Encounter: Payer: Self-pay | Admitting: Physician Assistant

## 2024-07-16 ENCOUNTER — Ambulatory Visit: Admitting: Physician Assistant

## 2024-07-16 VITALS — BP 124/66 | HR 74 | Temp 99.2°F | Ht 65.0 in | Wt 110.4 lb

## 2024-07-16 DIAGNOSIS — R030 Elevated blood-pressure reading, without diagnosis of hypertension: Secondary | ICD-10-CM

## 2024-07-16 DIAGNOSIS — J069 Acute upper respiratory infection, unspecified: Secondary | ICD-10-CM | POA: Diagnosis not present

## 2024-07-16 DIAGNOSIS — J310 Chronic rhinitis: Secondary | ICD-10-CM | POA: Diagnosis not present

## 2024-07-16 NOTE — Telephone Encounter (Signed)
 Copied from CRM #[id number]. Topic: Clinical - Medication Question >> Jul 16, 2024  2:38 PM [name] wrote: Reason for CRM: Patient is calling in because she saw Rita Webb today who recommended Flonase  for patient. Patient says she checked with her cancer doctor and they said it would be okay to take. Patient wants to know if the flonase  can be sent in to [facility]

## 2024-07-16 NOTE — Progress Notes (Signed)
 " Established patient visit  Patient: Rita Webb   DOB: 1946-04-21   79 y.o. Female  MRN: [id number] Visit Date: 07/16/2024  Today's healthcare provider: Jolynn Spencer, PA-C   Chief Complaint  Patient presents with   Acute Visit    Cough; fatigue // RN triage // Pt called in stating that she has been experiencing fatigue, cough with clear mucous, runny nose and waking up with more eye crusting when she sleeps. Pt denies fever, no SOB, no chest pain. Pt states she has tried home care with cough drops and warm compress to eyes. Pt states she contacted infusion center and they reminded pt f/u with PCP.   Cough    Onset : 1 week ago Taking: nothing Status: unchanged   Subjective     HPI     Acute Visit    Additional comments: Cough; fatigue // RN triage // Pt called in stating that she has been experiencing fatigue, cough with clear mucous, runny nose and waking up with more eye crusting when she sleeps. Pt denies fever, no SOB, no chest pain. Pt states she has tried home care with cough drops and warm compress to eyes. Pt states she contacted infusion center and they reminded pt f/u with PCP.        Cough    Additional comments: Onset : 1 week ago Taking: nothing Status: unchanged      Last edited by Lilian Fitzpatrick, CMA on 07/16/2024 11:30 AM.       Discussed the use of AI scribe software for clinical note transcription with the patient, who gave verbal consent to proceed.  History of Present Illness Rita Webb is a 79 year old female who presents with worsening cough and nasal symptoms.  Over the past week her chronic postnasal drip and cough have worsened, with cough felt more in her throat than in her chest. She started immunotherapy on September 29 and has had three infusions, and she is unsure if the timing of symptom worsening is related.  She has persistent runny nose and postnasal drip. She uses saline spray in the morning but avoids other nasal  sprays since nasal surgery to cauterize blood vessels, after which she stopped Flonase .  She has yellow eye drainage and occasional ear muffling, especially when blowing her nose. She has no sore throat or notable chest congestion. She sometimes needs to take deep breaths but denies ongoing shortness of breath.  She has allergies and previously tried allergy shots without benefit.       02/03/2024   10:01 AM 10/31/2023   10:25 AM 09/09/2022   10:24 AM  Depression screen PHQ 2/9  Decreased Interest 0 0 0  Down, Depressed, Hopeless 0 0 0  PHQ - 2 Score 0 0 0  Altered sleeping 0 0 0  Tired, decreased energy 1 1 0  Change in appetite 0 0 0  Feeling bad or failure about yourself  0 0 0  Trouble concentrating 0 0 0  Moving slowly or fidgety/restless 0 0 0  Suicidal thoughts 0 0 0  PHQ-9 Score 1  1  0   Difficult doing work/chores Not difficult at all Not difficult at all Not difficult at all     Data saved with a previous flowsheet row definition      02/03/2024   10:02 AM 10/31/2023   10:24 AM 03/17/2020    3:27 PM  GAD 7 : Generalized Anxiety Score  Nervous, Anxious, on Edge 0 1  1  Control/stop worrying 0 1 0  Worry too much - different things 0 1 0  Trouble relaxing 0 0 0  Restless 0 0 0  Easily annoyed or irritable 0 1 0  Afraid - awful might happen 0 1 1  Total GAD 7 Score 0 5 2  Anxiety Difficulty Not difficult at all Not difficult at all Not difficult at all    Medications: Show/hide medication list[1]  Review of Systems All negative Except see HPI       Objective    BP (!) 156/68 (BP Location: Right Arm, Patient Position: Sitting, Cuff Size: Small)   Pulse 80   Temp 99.2 F (37.3 C) (Oral)   Ht 5' 5 (1.651 m)   Wt 110 lb 6.4 oz (50.1 kg)   SpO2 94%   BMI 18.37 kg/m     Physical Exam Vitals reviewed.  Constitutional:      Appearance: She is normal weight.  HENT:     Head: Normocephalic and atraumatic.     Right Ear: Ear canal and external ear  normal.     Left Ear: Ear canal and external ear normal.     Nose: Congestion (mild) and rhinorrhea present.     Mouth/Throat:     Pharynx: No posterior oropharyngeal erythema.     Comments: Postnasal drainage noted Eyes:     General: No scleral icterus.       Right eye: No discharge.        Left eye: No discharge.     Extraocular Movements: Extraocular movements intact.     Pupils: Pupils are equal, round, and reactive to light.  Cardiovascular:     Rate and Rhythm: Normal rate and regular rhythm.  Pulmonary:     Effort: Pulmonary effort is normal.     Breath sounds: Normal breath sounds.  Abdominal:     General: Abdomen is flat. Bowel sounds are normal.     Palpations: Abdomen is soft.  Lymphadenopathy:     Cervical: No cervical adenopathy.  Neurological:     Mental Status: She is alert.      No results found for any visits on 07/16/24.      Assessment & Plan Acute upper respiratory infection Symptoms include cough, postnasal drip, runny nose, and low-grade fever  Differential includes viral infection vs cough as a side effect of current immunotherapy. Pt was diagnosed with non-small cell lung cancer and managed by oncology - Continue symptomatic treatment with hydration and rest. - Use over-the-counter Flonase  gel with nasal saline if approved by ENT. - Monitor temperature and blood pressure. - Consider Tylenol  for fever and pain management. - Return for follow-up in 7 days if symptoms persist.  Allergic rhinitis Chronic allergic rhinitis with postnasal drip and runny nose  Previous allergy shots were ineffective. ENT advised against nasal sprays due to past nasal surgery. - Consult with ENT regarding the use of Flonase . - Use nasal saline gel for symptom relief. - Monitor symptoms and adjust treatment as needed.  Elevated BP reading Blood pressure is usually low but currently elevated 156/68-first reading, Second reading of BP was WNL No current antihypertensive  medication. Concerns about potential increase in blood pressure with decongestants. - Monitor blood pressure regularly. - Ensure adequate hydration. - Report any significant changes in blood pressure. Will follow-up   No orders of the defined types were placed in this encounter.   No follow-ups on file.   The patient was advised to call back or  seek an in-person evaluation if the symptoms worsen or if the condition fails to improve as anticipated.  I discussed the assessment and treatment plan with the patient. The patient was provided an opportunity to ask questions and all were answered. The patient agreed with the plan and demonstrated an understanding of the instructions.  I, Anyah Swallow, PA-C have reviewed all documentation for this visit. The documentation on 07/16/2024  for the exam, diagnosis, procedures, and orders are all accurate and complete.  Jolynn Spencer, [facility], MMS [facility] [phone] (phone) [phone] (fax)  [facility]     [1]  Outpatient Medications Prior to Visit  Medication Sig   acetaminophen  (TYLENOL ) 325 MG tablet Take 650 mg by mouth every 6 (six) hours as needed.   B COMPLEX-C PO Take 1 tablet by mouth daily.    Calcium Carbonate-Vitamin D  600-200 MG-UNIT CAPS Take 2 capsules by mouth daily.    Cholecalciferol 1000 UNITS capsule Take 1,000 Units by mouth daily.    clotrimazole -betamethasone  (LOTRISONE ) cream Apply 1 Application topically daily.   dorzolamide-timolol (COSOPT) 2-0.5 % ophthalmic solution Place 1 drop into both eyes 2 (two) times daily.   ELIQUIS  5 MG TABS tablet TAKE 1 TABLET BY MOUTH TWICE A DAY   latanoprost (XALATAN) 0.005 % ophthalmic solution SMARTSIG:In Eye(s)   mometasone (ELOCON) 0.1 % lotion Apply topically.   MULTIPLE VITAMIN PO Take 1 tablet by mouth daily.    Omega-3 Fatty Acids (FISH OIL) 1200 MG CAPS Take 1 capsule by mouth daily.    sertraline  (ZOLOFT ) 50 MG tablet TAKE ONE TABLET BY  MOUTH ONCE A DAY   simvastatin  (ZOCOR ) 10 MG tablet TAKE ONE TABLET BY MOUTH AT BEDTIME   No facility-administered medications prior to visit.   "

## 2024-07-19 ENCOUNTER — Other Ambulatory Visit: Payer: Self-pay | Admitting: Physician Assistant

## 2024-07-19 DIAGNOSIS — R0981 Nasal congestion: Secondary | ICD-10-CM

## 2024-07-19 MED ORDER — FLUTICASONE PROPIONATE 50 MCG/ACT NA SUSP: 2.0000 | Freq: Every day | NASAL | 6 refills | Status: AC

## 2024-07-19 MED ORDER — FLUTICASONE PROPIONATE 50 MCG/ACT NA SUSP: 2.0000 | Freq: Every day | NASAL | 0 refills | Status: DC

## 2024-07-19 NOTE — Addendum Note (Signed)
 Addended by: Fumio Vandam on: 07/19/2024 04:55 AM   Modules accepted: Orders

## 2024-07-26 ENCOUNTER — Encounter: Payer: Self-pay | Admitting: Family Medicine

## 2024-07-26 ENCOUNTER — Ambulatory Visit: Admitting: Family Medicine

## 2024-07-26 VITALS — BP 130/53 | HR 77 | Temp 98.4°F | Resp 14 | Ht 65.0 in | Wt 111.4 lb

## 2024-07-26 DIAGNOSIS — H5789 Other specified disorders of eye and adnexa: Secondary | ICD-10-CM | POA: Diagnosis not present

## 2024-07-26 DIAGNOSIS — K529 Noninfective gastroenteritis and colitis, unspecified: Secondary | ICD-10-CM | POA: Diagnosis not present

## 2024-07-26 DIAGNOSIS — R5383 Other fatigue: Secondary | ICD-10-CM | POA: Diagnosis not present

## 2024-07-26 DIAGNOSIS — J069 Acute upper respiratory infection, unspecified: Secondary | ICD-10-CM | POA: Diagnosis not present

## 2024-07-26 NOTE — Progress Notes (Signed)
 "     Established patient visit   Patient: Rita Webb   DOB: 1945/09/08   79 y.o. Female  MRN: [id number] Visit Date: 07/26/2024  Today's healthcare provider: Jon Eva, MD   Chief Complaint  Patient presents with   Acute Visit    Cough f/u - reports has improved but still complains of blurry eyes, gunk, feels like sand is in eyes. No otc medication taken to help    Subjective    HPI HPI     Acute Visit    Additional comments: Cough f/u - reports has improved but still complains of blurry eyes, gunk, feels like sand is in eyes. No otc medication taken to help       Last edited by Wilfred Hargis RAMAN, CMA on 07/26/2024 12:59 PM.       Discussed the use of AI scribe software for clinical note transcription with the patient, who gave verbal consent to proceed.  Velton Channel, PA on 1/9 for cough. F/b Onc for non-small cell lung cancer  History of Present Illness   Rita Webb is a 79 year old female undergoing Keytruda  treatment who presents with eye irritation and vision issues.  She reports several days of significant eye irritation described as a gritty, sand in the eyes sensation with blurred vision. She notes abnormal colored discharge that causes her eyelids to be stuck shut in the mornings. She denies known contact with others with similar eye symptoms.  She has received three Keytruda  infusions and uses Flonase  and Tylenol  as needed for fever. She notes marked fatigue and a significant drop in energy after the third infusion.  She previously had an illness diagnosed as allergy-related with head and throat symptoms and no lung involvement.  She has stayed home except for medical visits. Her only notable sick contact was her granddaughter with a cough who visited at Christmas. She denies other recent sick contacts.  She has chronic diarrhea controlled with probiotics and Imodium, which she now takes every other day.       Medications: Show/hide  medication list[1]  Review of Systems     Objective    BP (!) 130/53   Pulse 77   Temp 98.4 F (36.9 C) (Oral)   Resp 14   Ht 5' 5 (1.651 m)   Wt 111 lb 6.4 oz (50.5 kg)   SpO2 95%   BMI 18.54 kg/m    Physical Exam Vitals reviewed.  Constitutional:      General: She is not in acute distress.    Appearance: Normal appearance. She is well-developed. She is not diaphoretic.  HENT:     Head: Normocephalic and atraumatic.  Eyes:     General: No scleral icterus.    Conjunctiva/sclera: Conjunctivae normal.  Neck:     Thyroid : No thyromegaly.  Cardiovascular:     Rate and Rhythm: Normal rate and regular rhythm.     Heart sounds: Normal heart sounds. No murmur heard. Pulmonary:     Effort: Pulmonary effort is normal. No respiratory distress.     Breath sounds: Normal breath sounds. No wheezing, rhonchi or rales.  Musculoskeletal:     Cervical back: Neck supple.     Right lower leg: No edema.     Left lower leg: No edema.  Lymphadenopathy:     Cervical: No cervical adenopathy.  Skin:    General: Skin is warm and dry.     Findings: No rash.  Neurological:     Mental  Status: She is alert and oriented to person, place, and time. Mental status is at baseline.  Psychiatric:        Mood and Affect: Mood normal.        Behavior: Behavior normal.      No results found for any visits on 07/26/24.  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 Symptoms suggest uveitis, potentially related to Keytruda , affecting vision and raising concern for a more serious condition than conjunctivitis. - Referred to [facility] for evaluation by an optometrist. - Advised warm  compresses to alleviate symptoms. - Instructed to follow up with oncologist if uveitis is confirmed.  Acute upper respiratory infection Cough and drainage, previously diagnosed as allergy-related viral infection. Lungs are clear, indicating symptoms are not pulmonary in origin.  Diarrhea, resolved Diarrhea previously managed with probiotics and Imodium, now resolved with current regimen of taking Imodium every other day.  Fatigue secondary to immunotherapy Significant fatigue following third Keytruda  infusion, likely related to cumulative effects of immunotherapy and recent viral infection. Fatigue impacts daily activities. - Continue to monitor energy levels and adjust activities as needed.       Return in about 2 months (around 09/23/2024) for CPE.       Jon Eva, MD  [facility] [phone] (phone) [phone] (fax)  [facility]       [1]  Outpatient Medications Prior to Visit  Medication Sig   acetaminophen  (TYLENOL ) 325 MG tablet Take 650 mg by mouth every 6 (six) hours as needed.   B COMPLEX-C PO Take 1 tablet by mouth daily.    Calcium Carbonate-Vitamin D  600-200 MG-UNIT CAPS Take 2 capsules by mouth daily.    Cholecalciferol 1000 UNITS capsule Take 1,000 Units by mouth daily.    clotrimazole -betamethasone  (LOTRISONE ) cream Apply 1 Application topically daily.   dorzolamide-timolol (COSOPT) 2-0.5 % ophthalmic solution Place 1 drop into both eyes 2 (two) times daily.   ELIQUIS  5 MG TABS tablet TAKE 1 TABLET BY MOUTH TWICE A DAY   fluticasone  (FLONASE ) 50 MCG/ACT nasal spray Place 2 sprays into both nostrils daily.   latanoprost (XALATAN) 0.005 % ophthalmic solution SMARTSIG:In Eye(s)   mometasone (ELOCON) 0.1 % lotion Apply topically.   MULTIPLE VITAMIN PO Take 1 tablet by mouth daily.    Omega-3 Fatty Acids (FISH OIL) 1200 MG CAPS Take 1 capsule by mouth daily.    sertraline  (ZOLOFT ) 50 MG tablet TAKE ONE TABLET BY  MOUTH ONCE A DAY   simvastatin  (ZOCOR ) 10 MG tablet TAKE ONE TABLET BY MOUTH AT BEDTIME   No facility-administered medications prior to visit.   "

## 2024-08-05 ENCOUNTER — Ambulatory Visit: Admitting: Family Medicine

## 2024-08-09 ENCOUNTER — Ambulatory Visit: Admitting: Family Medicine

## 2024-10-04 ENCOUNTER — Encounter: Admitting: Family Medicine
